# Patient Record
Sex: Female | Born: 1940
Health system: Southern US, Community
[De-identification: ages and names within clinical notes are randomized; demographics above are authoritative.]

## PROBLEM LIST (undated history)

## (undated) DIAGNOSIS — I2510 Atherosclerotic heart disease of native coronary artery without angina pectoris: Secondary | ICD-10-CM

## (undated) DIAGNOSIS — I4891 Unspecified atrial fibrillation: Secondary | ICD-10-CM

## (undated) DIAGNOSIS — R300 Dysuria: Secondary | ICD-10-CM

## (undated) DIAGNOSIS — E559 Vitamin D deficiency, unspecified: Secondary | ICD-10-CM

## (undated) DIAGNOSIS — H8110 Benign paroxysmal vertigo, unspecified ear: Secondary | ICD-10-CM

## (undated) DIAGNOSIS — G2581 Restless legs syndrome: Secondary | ICD-10-CM

## (undated) DIAGNOSIS — N3090 Cystitis, unspecified without hematuria: Secondary | ICD-10-CM

## (undated) DIAGNOSIS — Z9884 Bariatric surgery status: Secondary | ICD-10-CM

## (undated) DIAGNOSIS — R350 Frequency of micturition: Secondary | ICD-10-CM

## (undated) DIAGNOSIS — E785 Hyperlipidemia, unspecified: Secondary | ICD-10-CM

## (undated) DIAGNOSIS — Z862 Personal history of diseases of the blood and blood-forming organs and certain disorders involving the immune mechanism: Secondary | ICD-10-CM

## (undated) DIAGNOSIS — G479 Sleep disorder, unspecified: Secondary | ICD-10-CM

## (undated) DIAGNOSIS — R600 Localized edema: Secondary | ICD-10-CM

## (undated) DIAGNOSIS — F17200 Nicotine dependence, unspecified, uncomplicated: Secondary | ICD-10-CM

## (undated) HISTORY — DX: Atherosclerotic heart disease of native coronary artery without angina pectoris: I25.10

## (undated) HISTORY — DX: Nicotine dependence, unspecified, uncomplicated: F17.200

## (undated) HISTORY — DX: Dysuria: R30.0

## (undated) HISTORY — DX: Bariatric surgery status: Z98.84

## (undated) HISTORY — DX: Unspecified atrial fibrillation: I48.91

## (undated) HISTORY — DX: Sleep disorder, unspecified: G47.9

## (undated) HISTORY — DX: Hyperlipidemia, unspecified: E78.5

## (undated) HISTORY — DX: Localized edema: R60.0

## (undated) HISTORY — DX: Benign paroxysmal vertigo, unspecified ear: H81.10

## (undated) HISTORY — DX: Frequency of micturition: R35.0

## (undated) HISTORY — DX: Restless legs syndrome: G25.81

## (undated) HISTORY — DX: Vitamin D deficiency, unspecified: E55.9

## (undated) HISTORY — DX: Cystitis, unspecified without hematuria: N30.90

## (undated) HISTORY — DX: Personal history of diseases of the blood and blood-forming organs and certain disorders involving the immune mechanism: Z86.2

## (undated) HISTORY — PX: CARDIAC CATHETERIZATION: SHX172

---

## 2004-04-28 ENCOUNTER — Encounter: Payer: Self-pay | Admitting: Nurse Practitioner

## 2014-11-27 DIAGNOSIS — Z7901 Long term (current) use of anticoagulants: Secondary | ICD-10-CM | POA: Diagnosis not present

## 2014-11-27 DIAGNOSIS — I480 Paroxysmal atrial fibrillation: Secondary | ICD-10-CM | POA: Diagnosis not present

## 2014-12-02 DIAGNOSIS — J111 Influenza due to unidentified influenza virus with other respiratory manifestations: Secondary | ICD-10-CM | POA: Diagnosis not present

## 2014-12-14 DIAGNOSIS — I480 Paroxysmal atrial fibrillation: Secondary | ICD-10-CM | POA: Diagnosis not present

## 2014-12-14 DIAGNOSIS — Z7901 Long term (current) use of anticoagulants: Secondary | ICD-10-CM | POA: Diagnosis not present

## 2014-12-17 DIAGNOSIS — J069 Acute upper respiratory infection, unspecified: Secondary | ICD-10-CM | POA: Diagnosis not present

## 2014-12-22 DIAGNOSIS — J209 Acute bronchitis, unspecified: Secondary | ICD-10-CM | POA: Diagnosis not present

## 2014-12-25 DIAGNOSIS — B351 Tinea unguium: Secondary | ICD-10-CM | POA: Diagnosis not present

## 2014-12-25 DIAGNOSIS — L603 Nail dystrophy: Secondary | ICD-10-CM | POA: Diagnosis not present

## 2014-12-25 DIAGNOSIS — M7752 Other enthesopathy of left foot: Secondary | ICD-10-CM | POA: Diagnosis not present

## 2014-12-25 DIAGNOSIS — E1151 Type 2 diabetes mellitus with diabetic peripheral angiopathy without gangrene: Secondary | ICD-10-CM | POA: Diagnosis not present

## 2014-12-25 DIAGNOSIS — L84 Corns and callosities: Secondary | ICD-10-CM | POA: Diagnosis not present

## 2015-01-08 DIAGNOSIS — M25572 Pain in left ankle and joints of left foot: Secondary | ICD-10-CM | POA: Diagnosis not present

## 2015-01-08 DIAGNOSIS — M2012 Hallux valgus (acquired), left foot: Secondary | ICD-10-CM | POA: Diagnosis not present

## 2015-01-08 DIAGNOSIS — E1142 Type 2 diabetes mellitus with diabetic polyneuropathy: Secondary | ICD-10-CM | POA: Diagnosis not present

## 2015-01-08 DIAGNOSIS — M7752 Other enthesopathy of left foot: Secondary | ICD-10-CM | POA: Diagnosis not present

## 2015-01-08 DIAGNOSIS — E1151 Type 2 diabetes mellitus with diabetic peripheral angiopathy without gangrene: Secondary | ICD-10-CM | POA: Diagnosis not present

## 2015-01-11 DIAGNOSIS — I480 Paroxysmal atrial fibrillation: Secondary | ICD-10-CM | POA: Diagnosis not present

## 2015-01-11 DIAGNOSIS — I495 Sick sinus syndrome: Secondary | ICD-10-CM | POA: Diagnosis not present

## 2015-01-11 DIAGNOSIS — J0110 Acute frontal sinusitis, unspecified: Secondary | ICD-10-CM | POA: Diagnosis not present

## 2015-01-11 DIAGNOSIS — J3089 Other allergic rhinitis: Secondary | ICD-10-CM | POA: Diagnosis not present

## 2015-01-11 DIAGNOSIS — G4733 Obstructive sleep apnea (adult) (pediatric): Secondary | ICD-10-CM | POA: Diagnosis not present

## 2015-01-11 DIAGNOSIS — Z7901 Long term (current) use of anticoagulants: Secondary | ICD-10-CM | POA: Diagnosis not present

## 2015-01-11 DIAGNOSIS — I10 Essential (primary) hypertension: Secondary | ICD-10-CM | POA: Diagnosis not present

## 2015-01-18 DIAGNOSIS — G4733 Obstructive sleep apnea (adult) (pediatric): Secondary | ICD-10-CM | POA: Diagnosis not present

## 2015-01-18 DIAGNOSIS — G47419 Narcolepsy without cataplexy: Secondary | ICD-10-CM | POA: Diagnosis not present

## 2015-01-29 DIAGNOSIS — E1151 Type 2 diabetes mellitus with diabetic peripheral angiopathy without gangrene: Secondary | ICD-10-CM | POA: Diagnosis not present

## 2015-01-29 DIAGNOSIS — M79675 Pain in left toe(s): Secondary | ICD-10-CM | POA: Diagnosis not present

## 2015-01-29 DIAGNOSIS — M25572 Pain in left ankle and joints of left foot: Secondary | ICD-10-CM | POA: Diagnosis not present

## 2015-01-29 DIAGNOSIS — M7752 Other enthesopathy of left foot: Secondary | ICD-10-CM | POA: Diagnosis not present

## 2015-02-03 DIAGNOSIS — I4891 Unspecified atrial fibrillation: Secondary | ICD-10-CM | POA: Diagnosis not present

## 2015-02-03 DIAGNOSIS — Z950 Presence of cardiac pacemaker: Secondary | ICD-10-CM | POA: Diagnosis not present

## 2015-02-04 DIAGNOSIS — Z8679 Personal history of other diseases of the circulatory system: Secondary | ICD-10-CM | POA: Diagnosis not present

## 2015-02-04 DIAGNOSIS — Z8639 Personal history of other endocrine, nutritional and metabolic disease: Secondary | ICD-10-CM | POA: Diagnosis not present

## 2015-02-04 DIAGNOSIS — I209 Angina pectoris, unspecified: Secondary | ICD-10-CM | POA: Diagnosis not present

## 2015-02-10 DIAGNOSIS — I517 Cardiomegaly: Secondary | ICD-10-CM | POA: Diagnosis not present

## 2015-02-10 DIAGNOSIS — I440 Atrioventricular block, first degree: Secondary | ICD-10-CM | POA: Diagnosis not present

## 2015-02-10 DIAGNOSIS — I10 Essential (primary) hypertension: Secondary | ICD-10-CM | POA: Diagnosis not present

## 2015-02-10 DIAGNOSIS — I083 Combined rheumatic disorders of mitral, aortic and tricuspid valves: Secondary | ICD-10-CM | POA: Diagnosis not present

## 2015-02-10 DIAGNOSIS — I480 Paroxysmal atrial fibrillation: Secondary | ICD-10-CM | POA: Diagnosis not present

## 2015-02-10 DIAGNOSIS — Z7901 Long term (current) use of anticoagulants: Secondary | ICD-10-CM | POA: Diagnosis not present

## 2015-02-11 DIAGNOSIS — Z0000 Encounter for general adult medical examination without abnormal findings: Secondary | ICD-10-CM | POA: Diagnosis not present

## 2015-02-11 DIAGNOSIS — D649 Anemia, unspecified: Secondary | ICD-10-CM | POA: Diagnosis not present

## 2015-02-11 DIAGNOSIS — E780 Pure hypercholesterolemia: Secondary | ICD-10-CM | POA: Diagnosis not present

## 2015-02-11 DIAGNOSIS — D519 Vitamin B12 deficiency anemia, unspecified: Secondary | ICD-10-CM | POA: Diagnosis not present

## 2015-02-11 DIAGNOSIS — E119 Type 2 diabetes mellitus without complications: Secondary | ICD-10-CM | POA: Diagnosis not present

## 2015-02-26 DIAGNOSIS — L84 Corns and callosities: Secondary | ICD-10-CM | POA: Diagnosis not present

## 2015-02-26 DIAGNOSIS — E1151 Type 2 diabetes mellitus with diabetic peripheral angiopathy without gangrene: Secondary | ICD-10-CM | POA: Diagnosis not present

## 2015-02-26 DIAGNOSIS — L603 Nail dystrophy: Secondary | ICD-10-CM | POA: Diagnosis not present

## 2015-02-26 DIAGNOSIS — B351 Tinea unguium: Secondary | ICD-10-CM | POA: Diagnosis not present

## 2015-02-26 DIAGNOSIS — M2042 Other hammer toe(s) (acquired), left foot: Secondary | ICD-10-CM | POA: Diagnosis not present

## 2015-02-26 DIAGNOSIS — M7752 Other enthesopathy of left foot: Secondary | ICD-10-CM | POA: Diagnosis not present

## 2015-03-01 DIAGNOSIS — G4733 Obstructive sleep apnea (adult) (pediatric): Secondary | ICD-10-CM | POA: Diagnosis not present

## 2015-03-01 DIAGNOSIS — I482 Chronic atrial fibrillation: Secondary | ICD-10-CM | POA: Diagnosis not present

## 2015-03-01 DIAGNOSIS — G4710 Hypersomnia, unspecified: Secondary | ICD-10-CM | POA: Diagnosis not present

## 2015-03-04 DIAGNOSIS — E780 Pure hypercholesterolemia: Secondary | ICD-10-CM | POA: Diagnosis not present

## 2015-03-04 DIAGNOSIS — I10 Essential (primary) hypertension: Secondary | ICD-10-CM | POA: Diagnosis not present

## 2015-03-04 DIAGNOSIS — E119 Type 2 diabetes mellitus without complications: Secondary | ICD-10-CM | POA: Diagnosis not present

## 2015-03-04 DIAGNOSIS — I214 Non-ST elevation (NSTEMI) myocardial infarction: Secondary | ICD-10-CM | POA: Diagnosis not present

## 2015-03-08 DIAGNOSIS — M79672 Pain in left foot: Secondary | ICD-10-CM | POA: Diagnosis not present

## 2015-03-08 DIAGNOSIS — M2042 Other hammer toe(s) (acquired), left foot: Secondary | ICD-10-CM | POA: Diagnosis not present

## 2015-03-08 DIAGNOSIS — I495 Sick sinus syndrome: Secondary | ICD-10-CM | POA: Diagnosis not present

## 2015-03-08 DIAGNOSIS — I10 Essential (primary) hypertension: Secondary | ICD-10-CM | POA: Diagnosis not present

## 2015-03-08 DIAGNOSIS — M2012 Hallux valgus (acquired), left foot: Secondary | ICD-10-CM | POA: Diagnosis not present

## 2015-03-08 DIAGNOSIS — I480 Paroxysmal atrial fibrillation: Secondary | ICD-10-CM | POA: Diagnosis not present

## 2015-03-08 DIAGNOSIS — Z7901 Long term (current) use of anticoagulants: Secondary | ICD-10-CM | POA: Diagnosis not present

## 2015-03-12 DIAGNOSIS — Z7901 Long term (current) use of anticoagulants: Secondary | ICD-10-CM | POA: Diagnosis not present

## 2015-03-12 DIAGNOSIS — I480 Paroxysmal atrial fibrillation: Secondary | ICD-10-CM | POA: Diagnosis not present

## 2015-03-16 DIAGNOSIS — Z8679 Personal history of other diseases of the circulatory system: Secondary | ICD-10-CM | POA: Diagnosis not present

## 2015-03-16 DIAGNOSIS — D509 Iron deficiency anemia, unspecified: Secondary | ICD-10-CM | POA: Diagnosis not present

## 2015-03-16 DIAGNOSIS — E119 Type 2 diabetes mellitus without complications: Secondary | ICD-10-CM | POA: Diagnosis not present

## 2015-03-16 DIAGNOSIS — Z8639 Personal history of other endocrine, nutritional and metabolic disease: Secondary | ICD-10-CM | POA: Diagnosis not present

## 2015-03-16 DIAGNOSIS — I10 Essential (primary) hypertension: Secondary | ICD-10-CM | POA: Diagnosis not present

## 2015-03-18 DIAGNOSIS — I480 Paroxysmal atrial fibrillation: Secondary | ICD-10-CM | POA: Diagnosis not present

## 2015-03-18 DIAGNOSIS — Z01818 Encounter for other preprocedural examination: Secondary | ICD-10-CM | POA: Diagnosis not present

## 2015-03-18 DIAGNOSIS — Z7901 Long term (current) use of anticoagulants: Secondary | ICD-10-CM | POA: Diagnosis not present

## 2015-03-18 DIAGNOSIS — I7389 Other specified peripheral vascular diseases: Secondary | ICD-10-CM | POA: Diagnosis not present

## 2015-03-19 DIAGNOSIS — Z7901 Long term (current) use of anticoagulants: Secondary | ICD-10-CM | POA: Diagnosis not present

## 2015-03-19 DIAGNOSIS — I480 Paroxysmal atrial fibrillation: Secondary | ICD-10-CM | POA: Diagnosis not present

## 2015-03-22 DIAGNOSIS — Z7901 Long term (current) use of anticoagulants: Secondary | ICD-10-CM | POA: Diagnosis not present

## 2015-03-22 DIAGNOSIS — I480 Paroxysmal atrial fibrillation: Secondary | ICD-10-CM | POA: Diagnosis not present

## 2015-04-01 DIAGNOSIS — M79672 Pain in left foot: Secondary | ICD-10-CM | POA: Diagnosis not present

## 2015-04-01 DIAGNOSIS — E119 Type 2 diabetes mellitus without complications: Secondary | ICD-10-CM | POA: Diagnosis not present

## 2015-04-01 DIAGNOSIS — M2042 Other hammer toe(s) (acquired), left foot: Secondary | ICD-10-CM | POA: Diagnosis not present

## 2015-04-01 DIAGNOSIS — I10 Essential (primary) hypertension: Secondary | ICD-10-CM | POA: Diagnosis not present

## 2015-04-01 DIAGNOSIS — M2012 Hallux valgus (acquired), left foot: Secondary | ICD-10-CM | POA: Diagnosis not present

## 2015-04-09 DIAGNOSIS — M2012 Hallux valgus (acquired), left foot: Secondary | ICD-10-CM | POA: Diagnosis not present

## 2015-04-09 DIAGNOSIS — G4733 Obstructive sleep apnea (adult) (pediatric): Secondary | ICD-10-CM | POA: Diagnosis not present

## 2015-04-09 DIAGNOSIS — E119 Type 2 diabetes mellitus without complications: Secondary | ICD-10-CM | POA: Diagnosis not present

## 2015-04-09 DIAGNOSIS — I10 Essential (primary) hypertension: Secondary | ICD-10-CM | POA: Diagnosis not present

## 2015-04-09 DIAGNOSIS — Z9884 Bariatric surgery status: Secondary | ICD-10-CM | POA: Diagnosis not present

## 2015-04-09 DIAGNOSIS — M2042 Other hammer toe(s) (acquired), left foot: Secondary | ICD-10-CM | POA: Diagnosis not present

## 2015-04-09 DIAGNOSIS — L859 Epidermal thickening, unspecified: Secondary | ICD-10-CM | POA: Diagnosis not present

## 2015-04-09 DIAGNOSIS — I4891 Unspecified atrial fibrillation: Secondary | ICD-10-CM | POA: Diagnosis not present

## 2015-04-09 DIAGNOSIS — F17200 Nicotine dependence, unspecified, uncomplicated: Secondary | ICD-10-CM | POA: Diagnosis not present

## 2015-04-09 DIAGNOSIS — J449 Chronic obstructive pulmonary disease, unspecified: Secondary | ICD-10-CM | POA: Diagnosis not present

## 2015-04-09 DIAGNOSIS — M19072 Primary osteoarthritis, left ankle and foot: Secondary | ICD-10-CM | POA: Diagnosis not present

## 2015-04-09 DIAGNOSIS — M79675 Pain in left toe(s): Secondary | ICD-10-CM | POA: Diagnosis not present

## 2015-04-09 DIAGNOSIS — M79672 Pain in left foot: Secondary | ICD-10-CM | POA: Diagnosis not present

## 2015-04-15 DIAGNOSIS — K635 Polyp of colon: Secondary | ICD-10-CM | POA: Diagnosis not present

## 2015-04-15 DIAGNOSIS — R159 Full incontinence of feces: Secondary | ICD-10-CM | POA: Diagnosis not present

## 2015-04-15 DIAGNOSIS — R194 Change in bowel habit: Secondary | ICD-10-CM | POA: Diagnosis not present

## 2015-04-15 DIAGNOSIS — K219 Gastro-esophageal reflux disease without esophagitis: Secondary | ICD-10-CM | POA: Diagnosis not present

## 2015-04-26 DIAGNOSIS — I495 Sick sinus syndrome: Secondary | ICD-10-CM | POA: Diagnosis not present

## 2015-04-26 DIAGNOSIS — I4891 Unspecified atrial fibrillation: Secondary | ICD-10-CM | POA: Diagnosis not present

## 2015-04-26 DIAGNOSIS — I10 Essential (primary) hypertension: Secondary | ICD-10-CM | POA: Diagnosis not present

## 2015-04-29 DIAGNOSIS — Z8679 Personal history of other diseases of the circulatory system: Secondary | ICD-10-CM | POA: Diagnosis not present

## 2015-04-29 DIAGNOSIS — E119 Type 2 diabetes mellitus without complications: Secondary | ICD-10-CM | POA: Diagnosis not present

## 2015-04-29 DIAGNOSIS — E780 Pure hypercholesterolemia: Secondary | ICD-10-CM | POA: Diagnosis not present

## 2015-04-29 DIAGNOSIS — K5900 Constipation, unspecified: Secondary | ICD-10-CM | POA: Diagnosis not present

## 2015-05-05 DIAGNOSIS — M2042 Other hammer toe(s) (acquired), left foot: Secondary | ICD-10-CM | POA: Diagnosis not present

## 2015-05-05 DIAGNOSIS — M2012 Hallux valgus (acquired), left foot: Secondary | ICD-10-CM | POA: Diagnosis not present

## 2015-05-13 ENCOUNTER — Encounter: Payer: Self-pay | Admitting: Nurse Practitioner

## 2015-05-13 DIAGNOSIS — D124 Benign neoplasm of descending colon: Secondary | ICD-10-CM | POA: Diagnosis not present

## 2015-05-13 DIAGNOSIS — K635 Polyp of colon: Secondary | ICD-10-CM | POA: Diagnosis not present

## 2015-05-13 DIAGNOSIS — Z880 Allergy status to penicillin: Secondary | ICD-10-CM | POA: Diagnosis not present

## 2015-05-13 DIAGNOSIS — D649 Anemia, unspecified: Secondary | ICD-10-CM | POA: Diagnosis not present

## 2015-05-13 DIAGNOSIS — R159 Full incontinence of feces: Secondary | ICD-10-CM | POA: Diagnosis not present

## 2015-05-13 DIAGNOSIS — K6389 Other specified diseases of intestine: Secondary | ICD-10-CM | POA: Diagnosis not present

## 2015-05-13 DIAGNOSIS — D122 Benign neoplasm of ascending colon: Secondary | ICD-10-CM | POA: Diagnosis not present

## 2015-05-13 DIAGNOSIS — I10 Essential (primary) hypertension: Secondary | ICD-10-CM | POA: Diagnosis not present

## 2015-05-13 DIAGNOSIS — R194 Change in bowel habit: Secondary | ICD-10-CM | POA: Diagnosis not present

## 2015-05-13 DIAGNOSIS — D125 Benign neoplasm of sigmoid colon: Secondary | ICD-10-CM | POA: Diagnosis not present

## 2015-05-13 DIAGNOSIS — I4891 Unspecified atrial fibrillation: Secondary | ICD-10-CM | POA: Diagnosis not present

## 2015-05-13 DIAGNOSIS — E119 Type 2 diabetes mellitus without complications: Secondary | ICD-10-CM | POA: Diagnosis not present

## 2015-05-13 DIAGNOSIS — K219 Gastro-esophageal reflux disease without esophagitis: Secondary | ICD-10-CM | POA: Diagnosis not present

## 2015-05-19 DIAGNOSIS — M2042 Other hammer toe(s) (acquired), left foot: Secondary | ICD-10-CM | POA: Diagnosis not present

## 2015-05-19 DIAGNOSIS — M2012 Hallux valgus (acquired), left foot: Secondary | ICD-10-CM | POA: Diagnosis not present

## 2015-05-31 DIAGNOSIS — G4733 Obstructive sleep apnea (adult) (pediatric): Secondary | ICD-10-CM | POA: Diagnosis not present

## 2015-05-31 DIAGNOSIS — G4710 Hypersomnia, unspecified: Secondary | ICD-10-CM | POA: Diagnosis not present

## 2015-06-17 DIAGNOSIS — I480 Paroxysmal atrial fibrillation: Secondary | ICD-10-CM | POA: Diagnosis not present

## 2015-06-17 DIAGNOSIS — I495 Sick sinus syndrome: Secondary | ICD-10-CM | POA: Diagnosis not present

## 2015-06-17 DIAGNOSIS — Z45018 Encounter for adjustment and management of other part of cardiac pacemaker: Secondary | ICD-10-CM | POA: Diagnosis not present

## 2015-06-17 DIAGNOSIS — M2042 Other hammer toe(s) (acquired), left foot: Secondary | ICD-10-CM | POA: Diagnosis not present

## 2015-06-17 DIAGNOSIS — Z950 Presence of cardiac pacemaker: Secondary | ICD-10-CM | POA: Diagnosis not present

## 2015-06-17 DIAGNOSIS — M2012 Hallux valgus (acquired), left foot: Secondary | ICD-10-CM | POA: Diagnosis not present

## 2015-06-18 DIAGNOSIS — H2513 Age-related nuclear cataract, bilateral: Secondary | ICD-10-CM | POA: Diagnosis not present

## 2015-06-18 DIAGNOSIS — E119 Type 2 diabetes mellitus without complications: Secondary | ICD-10-CM | POA: Diagnosis not present

## 2015-06-18 DIAGNOSIS — H25013 Cortical age-related cataract, bilateral: Secondary | ICD-10-CM | POA: Diagnosis not present

## 2015-07-08 DIAGNOSIS — E1151 Type 2 diabetes mellitus with diabetic peripheral angiopathy without gangrene: Secondary | ICD-10-CM | POA: Diagnosis not present

## 2015-07-08 DIAGNOSIS — L84 Corns and callosities: Secondary | ICD-10-CM | POA: Diagnosis not present

## 2015-07-08 DIAGNOSIS — B351 Tinea unguium: Secondary | ICD-10-CM | POA: Diagnosis not present

## 2015-07-12 DIAGNOSIS — D519 Vitamin B12 deficiency anemia, unspecified: Secondary | ICD-10-CM | POA: Diagnosis not present

## 2015-07-12 DIAGNOSIS — Z139 Encounter for screening, unspecified: Secondary | ICD-10-CM | POA: Diagnosis not present

## 2015-07-12 DIAGNOSIS — E780 Pure hypercholesterolemia: Secondary | ICD-10-CM | POA: Diagnosis not present

## 2015-07-12 DIAGNOSIS — D649 Anemia, unspecified: Secondary | ICD-10-CM | POA: Diagnosis not present

## 2015-07-20 DIAGNOSIS — I10 Essential (primary) hypertension: Secondary | ICD-10-CM | POA: Diagnosis not present

## 2015-07-20 DIAGNOSIS — E780 Pure hypercholesterolemia: Secondary | ICD-10-CM | POA: Diagnosis not present

## 2015-07-20 DIAGNOSIS — I209 Angina pectoris, unspecified: Secondary | ICD-10-CM | POA: Diagnosis not present

## 2015-07-20 DIAGNOSIS — E119 Type 2 diabetes mellitus without complications: Secondary | ICD-10-CM | POA: Diagnosis not present

## 2015-08-12 DIAGNOSIS — R151 Fecal smearing: Secondary | ICD-10-CM | POA: Diagnosis not present

## 2015-08-12 DIAGNOSIS — K5900 Constipation, unspecified: Secondary | ICD-10-CM | POA: Diagnosis not present

## 2015-08-12 DIAGNOSIS — R140 Abdominal distension (gaseous): Secondary | ICD-10-CM | POA: Diagnosis not present

## 2015-08-12 DIAGNOSIS — R12 Heartburn: Secondary | ICD-10-CM | POA: Diagnosis not present

## 2015-08-12 DIAGNOSIS — D509 Iron deficiency anemia, unspecified: Secondary | ICD-10-CM | POA: Diagnosis not present

## 2015-08-12 DIAGNOSIS — K219 Gastro-esophageal reflux disease without esophagitis: Secondary | ICD-10-CM | POA: Diagnosis not present

## 2015-08-26 DIAGNOSIS — K5900 Constipation, unspecified: Secondary | ICD-10-CM | POA: Diagnosis not present

## 2015-08-26 DIAGNOSIS — R140 Abdominal distension (gaseous): Secondary | ICD-10-CM | POA: Diagnosis not present

## 2015-09-02 ENCOUNTER — Encounter: Payer: Self-pay | Admitting: Nurse Practitioner

## 2015-09-02 DIAGNOSIS — D509 Iron deficiency anemia, unspecified: Secondary | ICD-10-CM | POA: Diagnosis not present

## 2015-09-02 DIAGNOSIS — Z950 Presence of cardiac pacemaker: Secondary | ICD-10-CM | POA: Diagnosis not present

## 2015-09-02 DIAGNOSIS — R12 Heartburn: Secondary | ICD-10-CM | POA: Diagnosis not present

## 2015-09-02 DIAGNOSIS — Z720 Tobacco use: Secondary | ICD-10-CM | POA: Diagnosis not present

## 2015-09-02 DIAGNOSIS — E119 Type 2 diabetes mellitus without complications: Secondary | ICD-10-CM | POA: Diagnosis not present

## 2015-09-02 DIAGNOSIS — I4891 Unspecified atrial fibrillation: Secondary | ICD-10-CM | POA: Diagnosis not present

## 2015-09-02 DIAGNOSIS — R140 Abdominal distension (gaseous): Secondary | ICD-10-CM | POA: Diagnosis not present

## 2015-09-02 DIAGNOSIS — Z9884 Bariatric surgery status: Secondary | ICD-10-CM | POA: Diagnosis not present

## 2015-09-02 DIAGNOSIS — K219 Gastro-esophageal reflux disease without esophagitis: Secondary | ICD-10-CM | POA: Diagnosis not present

## 2015-09-02 DIAGNOSIS — D649 Anemia, unspecified: Secondary | ICD-10-CM | POA: Diagnosis not present

## 2015-09-02 DIAGNOSIS — I10 Essential (primary) hypertension: Secondary | ICD-10-CM | POA: Diagnosis not present

## 2015-09-02 DIAGNOSIS — Z7902 Long term (current) use of antithrombotics/antiplatelets: Secondary | ICD-10-CM | POA: Diagnosis not present

## 2015-09-02 DIAGNOSIS — K222 Esophageal obstruction: Secondary | ICD-10-CM | POA: Diagnosis not present

## 2015-09-02 DIAGNOSIS — G2581 Restless legs syndrome: Secondary | ICD-10-CM | POA: Diagnosis not present

## 2015-09-08 DIAGNOSIS — Z950 Presence of cardiac pacemaker: Secondary | ICD-10-CM | POA: Diagnosis not present

## 2015-09-08 DIAGNOSIS — I495 Sick sinus syndrome: Secondary | ICD-10-CM | POA: Diagnosis not present

## 2015-09-08 DIAGNOSIS — I480 Paroxysmal atrial fibrillation: Secondary | ICD-10-CM | POA: Diagnosis not present

## 2015-09-16 DIAGNOSIS — G47419 Narcolepsy without cataplexy: Secondary | ICD-10-CM | POA: Diagnosis not present

## 2015-09-16 DIAGNOSIS — G4733 Obstructive sleep apnea (adult) (pediatric): Secondary | ICD-10-CM | POA: Diagnosis not present

## 2015-09-30 DIAGNOSIS — B351 Tinea unguium: Secondary | ICD-10-CM | POA: Diagnosis not present

## 2015-09-30 DIAGNOSIS — L84 Corns and callosities: Secondary | ICD-10-CM | POA: Diagnosis not present

## 2015-09-30 DIAGNOSIS — E1151 Type 2 diabetes mellitus with diabetic peripheral angiopathy without gangrene: Secondary | ICD-10-CM | POA: Diagnosis not present

## 2015-10-12 DIAGNOSIS — E119 Type 2 diabetes mellitus without complications: Secondary | ICD-10-CM | POA: Diagnosis not present

## 2015-10-12 DIAGNOSIS — L309 Dermatitis, unspecified: Secondary | ICD-10-CM | POA: Diagnosis not present

## 2015-10-12 DIAGNOSIS — I10 Essential (primary) hypertension: Secondary | ICD-10-CM | POA: Diagnosis not present

## 2015-10-12 DIAGNOSIS — Z23 Encounter for immunization: Secondary | ICD-10-CM | POA: Diagnosis not present

## 2015-10-27 DIAGNOSIS — J00 Acute nasopharyngitis [common cold]: Secondary | ICD-10-CM | POA: Diagnosis not present

## 2015-10-27 DIAGNOSIS — J40 Bronchitis, not specified as acute or chronic: Secondary | ICD-10-CM | POA: Diagnosis not present

## 2015-10-27 DIAGNOSIS — I495 Sick sinus syndrome: Secondary | ICD-10-CM | POA: Diagnosis not present

## 2015-10-27 DIAGNOSIS — I4891 Unspecified atrial fibrillation: Secondary | ICD-10-CM | POA: Diagnosis not present

## 2015-10-27 DIAGNOSIS — I10 Essential (primary) hypertension: Secondary | ICD-10-CM | POA: Diagnosis not present

## 2015-12-03 DIAGNOSIS — K5900 Constipation, unspecified: Secondary | ICD-10-CM | POA: Diagnosis not present

## 2015-12-03 DIAGNOSIS — R151 Fecal smearing: Secondary | ICD-10-CM | POA: Diagnosis not present

## 2015-12-03 DIAGNOSIS — B351 Tinea unguium: Secondary | ICD-10-CM | POA: Diagnosis not present

## 2015-12-03 DIAGNOSIS — D509 Iron deficiency anemia, unspecified: Secondary | ICD-10-CM | POA: Diagnosis not present

## 2015-12-03 DIAGNOSIS — L84 Corns and callosities: Secondary | ICD-10-CM | POA: Diagnosis not present

## 2015-12-03 DIAGNOSIS — R6881 Early satiety: Secondary | ICD-10-CM | POA: Diagnosis not present

## 2015-12-03 DIAGNOSIS — E1151 Type 2 diabetes mellitus with diabetic peripheral angiopathy without gangrene: Secondary | ICD-10-CM | POA: Diagnosis not present

## 2015-12-03 DIAGNOSIS — R140 Abdominal distension (gaseous): Secondary | ICD-10-CM | POA: Diagnosis not present

## 2015-12-03 DIAGNOSIS — R12 Heartburn: Secondary | ICD-10-CM | POA: Diagnosis not present

## 2015-12-10 DIAGNOSIS — I480 Paroxysmal atrial fibrillation: Secondary | ICD-10-CM | POA: Diagnosis not present

## 2015-12-10 DIAGNOSIS — Z950 Presence of cardiac pacemaker: Secondary | ICD-10-CM | POA: Diagnosis not present

## 2015-12-10 DIAGNOSIS — I495 Sick sinus syndrome: Secondary | ICD-10-CM | POA: Diagnosis not present

## 2015-12-13 DIAGNOSIS — K5900 Constipation, unspecified: Secondary | ICD-10-CM | POA: Diagnosis not present

## 2015-12-13 DIAGNOSIS — R159 Full incontinence of feces: Secondary | ICD-10-CM | POA: Diagnosis not present

## 2015-12-14 DIAGNOSIS — G47419 Narcolepsy without cataplexy: Secondary | ICD-10-CM | POA: Diagnosis not present

## 2015-12-14 DIAGNOSIS — R51 Headache: Secondary | ICD-10-CM | POA: Diagnosis not present

## 2015-12-14 DIAGNOSIS — F17210 Nicotine dependence, cigarettes, uncomplicated: Secondary | ICD-10-CM | POA: Diagnosis not present

## 2015-12-14 DIAGNOSIS — R42 Dizziness and giddiness: Secondary | ICD-10-CM | POA: Diagnosis not present

## 2015-12-14 DIAGNOSIS — J45909 Unspecified asthma, uncomplicated: Secondary | ICD-10-CM | POA: Diagnosis not present

## 2015-12-14 DIAGNOSIS — R531 Weakness: Secondary | ICD-10-CM | POA: Diagnosis not present

## 2015-12-14 DIAGNOSIS — I10 Essential (primary) hypertension: Secondary | ICD-10-CM | POA: Diagnosis not present

## 2015-12-14 DIAGNOSIS — E785 Hyperlipidemia, unspecified: Secondary | ICD-10-CM | POA: Diagnosis not present

## 2015-12-14 DIAGNOSIS — Z8673 Personal history of transient ischemic attack (TIA), and cerebral infarction without residual deficits: Secondary | ICD-10-CM | POA: Diagnosis not present

## 2015-12-14 DIAGNOSIS — E119 Type 2 diabetes mellitus without complications: Secondary | ICD-10-CM | POA: Diagnosis not present

## 2015-12-14 DIAGNOSIS — I4891 Unspecified atrial fibrillation: Secondary | ICD-10-CM | POA: Diagnosis not present

## 2015-12-14 DIAGNOSIS — Z79899 Other long term (current) drug therapy: Secondary | ICD-10-CM | POA: Diagnosis not present

## 2015-12-14 DIAGNOSIS — R079 Chest pain, unspecified: Secondary | ICD-10-CM | POA: Diagnosis not present

## 2015-12-15 DIAGNOSIS — I10 Essential (primary) hypertension: Secondary | ICD-10-CM | POA: Diagnosis not present

## 2015-12-15 DIAGNOSIS — R079 Chest pain, unspecified: Secondary | ICD-10-CM | POA: Diagnosis not present

## 2015-12-15 DIAGNOSIS — R42 Dizziness and giddiness: Secondary | ICD-10-CM | POA: Diagnosis not present

## 2015-12-16 DIAGNOSIS — D509 Iron deficiency anemia, unspecified: Secondary | ICD-10-CM | POA: Diagnosis not present

## 2015-12-20 DIAGNOSIS — R5383 Other fatigue: Secondary | ICD-10-CM | POA: Diagnosis not present

## 2015-12-20 DIAGNOSIS — D509 Iron deficiency anemia, unspecified: Secondary | ICD-10-CM | POA: Diagnosis not present

## 2015-12-20 DIAGNOSIS — K5900 Constipation, unspecified: Secondary | ICD-10-CM | POA: Diagnosis not present

## 2015-12-20 DIAGNOSIS — K599 Functional intestinal disorder, unspecified: Secondary | ICD-10-CM | POA: Diagnosis not present

## 2015-12-20 DIAGNOSIS — I495 Sick sinus syndrome: Secondary | ICD-10-CM | POA: Diagnosis not present

## 2015-12-20 DIAGNOSIS — R42 Dizziness and giddiness: Secondary | ICD-10-CM | POA: Diagnosis not present

## 2015-12-20 DIAGNOSIS — I4891 Unspecified atrial fibrillation: Secondary | ICD-10-CM | POA: Diagnosis not present

## 2015-12-21 DIAGNOSIS — K5900 Constipation, unspecified: Secondary | ICD-10-CM | POA: Diagnosis not present

## 2015-12-22 DIAGNOSIS — K5900 Constipation, unspecified: Secondary | ICD-10-CM | POA: Diagnosis not present

## 2015-12-22 DIAGNOSIS — R42 Dizziness and giddiness: Secondary | ICD-10-CM | POA: Diagnosis not present

## 2015-12-22 DIAGNOSIS — I10 Essential (primary) hypertension: Secondary | ICD-10-CM | POA: Diagnosis not present

## 2015-12-23 DIAGNOSIS — D509 Iron deficiency anemia, unspecified: Secondary | ICD-10-CM | POA: Diagnosis not present

## 2015-12-24 DIAGNOSIS — I480 Paroxysmal atrial fibrillation: Secondary | ICD-10-CM | POA: Diagnosis not present

## 2015-12-24 DIAGNOSIS — K5900 Constipation, unspecified: Secondary | ICD-10-CM | POA: Diagnosis not present

## 2015-12-24 DIAGNOSIS — I4891 Unspecified atrial fibrillation: Secondary | ICD-10-CM | POA: Diagnosis not present

## 2015-12-27 DIAGNOSIS — D509 Iron deficiency anemia, unspecified: Secondary | ICD-10-CM | POA: Diagnosis not present

## 2015-12-27 DIAGNOSIS — K5900 Constipation, unspecified: Secondary | ICD-10-CM | POA: Diagnosis not present

## 2016-01-05 DIAGNOSIS — D509 Iron deficiency anemia, unspecified: Secondary | ICD-10-CM | POA: Diagnosis not present

## 2016-01-13 DIAGNOSIS — G4733 Obstructive sleep apnea (adult) (pediatric): Secondary | ICD-10-CM | POA: Diagnosis not present

## 2016-01-13 DIAGNOSIS — R0689 Other abnormalities of breathing: Secondary | ICD-10-CM | POA: Diagnosis not present

## 2016-01-17 DIAGNOSIS — L309 Dermatitis, unspecified: Secondary | ICD-10-CM | POA: Diagnosis not present

## 2016-01-17 DIAGNOSIS — I10 Essential (primary) hypertension: Secondary | ICD-10-CM | POA: Diagnosis not present

## 2016-02-11 DIAGNOSIS — R002 Palpitations: Secondary | ICD-10-CM | POA: Diagnosis not present

## 2016-02-13 DIAGNOSIS — Z01810 Encounter for preprocedural cardiovascular examination: Secondary | ICD-10-CM | POA: Diagnosis not present

## 2016-02-17 DIAGNOSIS — R300 Dysuria: Secondary | ICD-10-CM | POA: Diagnosis not present

## 2016-02-17 DIAGNOSIS — R002 Palpitations: Secondary | ICD-10-CM | POA: Diagnosis not present

## 2016-02-17 DIAGNOSIS — R3915 Urgency of urination: Secondary | ICD-10-CM | POA: Diagnosis not present

## 2016-02-17 DIAGNOSIS — N390 Urinary tract infection, site not specified: Secondary | ICD-10-CM | POA: Diagnosis not present

## 2016-02-23 DIAGNOSIS — M79672 Pain in left foot: Secondary | ICD-10-CM | POA: Diagnosis not present

## 2016-02-23 DIAGNOSIS — M19072 Primary osteoarthritis, left ankle and foot: Secondary | ICD-10-CM | POA: Diagnosis not present

## 2016-02-23 DIAGNOSIS — B351 Tinea unguium: Secondary | ICD-10-CM | POA: Diagnosis not present

## 2016-02-23 DIAGNOSIS — E1151 Type 2 diabetes mellitus with diabetic peripheral angiopathy without gangrene: Secondary | ICD-10-CM | POA: Diagnosis not present

## 2016-02-23 DIAGNOSIS — L84 Corns and callosities: Secondary | ICD-10-CM | POA: Diagnosis not present

## 2016-03-01 DIAGNOSIS — I495 Sick sinus syndrome: Secondary | ICD-10-CM | POA: Diagnosis not present

## 2016-03-01 DIAGNOSIS — R42 Dizziness and giddiness: Secondary | ICD-10-CM | POA: Diagnosis not present

## 2016-03-01 DIAGNOSIS — I10 Essential (primary) hypertension: Secondary | ICD-10-CM | POA: Diagnosis not present

## 2016-03-01 DIAGNOSIS — R000 Tachycardia, unspecified: Secondary | ICD-10-CM | POA: Diagnosis not present

## 2016-03-08 DIAGNOSIS — N390 Urinary tract infection, site not specified: Secondary | ICD-10-CM | POA: Diagnosis not present

## 2016-03-10 DIAGNOSIS — N390 Urinary tract infection, site not specified: Secondary | ICD-10-CM | POA: Diagnosis not present

## 2016-03-27 DIAGNOSIS — R197 Diarrhea, unspecified: Secondary | ICD-10-CM | POA: Diagnosis not present

## 2016-03-27 DIAGNOSIS — F411 Generalized anxiety disorder: Secondary | ICD-10-CM | POA: Diagnosis not present

## 2016-03-28 DIAGNOSIS — Z950 Presence of cardiac pacemaker: Secondary | ICD-10-CM | POA: Diagnosis not present

## 2016-03-28 DIAGNOSIS — I495 Sick sinus syndrome: Secondary | ICD-10-CM | POA: Diagnosis not present

## 2016-03-28 DIAGNOSIS — I480 Paroxysmal atrial fibrillation: Secondary | ICD-10-CM | POA: Diagnosis not present

## 2016-04-06 DIAGNOSIS — L920 Granuloma annulare: Secondary | ICD-10-CM | POA: Diagnosis not present

## 2016-04-06 DIAGNOSIS — D485 Neoplasm of uncertain behavior of skin: Secondary | ICD-10-CM | POA: Diagnosis not present

## 2016-04-06 DIAGNOSIS — L910 Hypertrophic scar: Secondary | ICD-10-CM | POA: Diagnosis not present

## 2016-04-07 DIAGNOSIS — R197 Diarrhea, unspecified: Secondary | ICD-10-CM | POA: Diagnosis not present

## 2016-04-20 DIAGNOSIS — L304 Erythema intertrigo: Secondary | ICD-10-CM | POA: Diagnosis not present

## 2016-05-05 DIAGNOSIS — E1151 Type 2 diabetes mellitus with diabetic peripheral angiopathy without gangrene: Secondary | ICD-10-CM | POA: Diagnosis not present

## 2016-05-05 DIAGNOSIS — B351 Tinea unguium: Secondary | ICD-10-CM | POA: Diagnosis not present

## 2016-05-05 DIAGNOSIS — L84 Corns and callosities: Secondary | ICD-10-CM | POA: Diagnosis not present

## 2016-05-08 DIAGNOSIS — R197 Diarrhea, unspecified: Secondary | ICD-10-CM | POA: Diagnosis not present

## 2016-05-08 DIAGNOSIS — M792 Neuralgia and neuritis, unspecified: Secondary | ICD-10-CM | POA: Diagnosis not present

## 2016-05-08 DIAGNOSIS — M25512 Pain in left shoulder: Secondary | ICD-10-CM | POA: Diagnosis not present

## 2016-05-08 DIAGNOSIS — M79622 Pain in left upper arm: Secondary | ICD-10-CM | POA: Diagnosis not present

## 2016-05-09 DIAGNOSIS — M25512 Pain in left shoulder: Secondary | ICD-10-CM | POA: Diagnosis not present

## 2016-05-09 DIAGNOSIS — M79622 Pain in left upper arm: Secondary | ICD-10-CM | POA: Diagnosis not present

## 2016-05-15 DIAGNOSIS — I4891 Unspecified atrial fibrillation: Secondary | ICD-10-CM | POA: Diagnosis not present

## 2016-05-15 DIAGNOSIS — I495 Sick sinus syndrome: Secondary | ICD-10-CM | POA: Diagnosis not present

## 2016-05-15 DIAGNOSIS — I471 Supraventricular tachycardia: Secondary | ICD-10-CM | POA: Diagnosis not present

## 2016-05-15 DIAGNOSIS — I10 Essential (primary) hypertension: Secondary | ICD-10-CM | POA: Diagnosis not present

## 2016-05-16 DIAGNOSIS — Z01810 Encounter for preprocedural cardiovascular examination: Secondary | ICD-10-CM | POA: Diagnosis not present

## 2016-05-16 DIAGNOSIS — I4891 Unspecified atrial fibrillation: Secondary | ICD-10-CM | POA: Diagnosis not present

## 2016-05-22 DIAGNOSIS — I480 Paroxysmal atrial fibrillation: Secondary | ICD-10-CM | POA: Diagnosis not present

## 2016-05-22 DIAGNOSIS — I471 Supraventricular tachycardia: Secondary | ICD-10-CM | POA: Diagnosis not present

## 2016-05-31 DIAGNOSIS — M25512 Pain in left shoulder: Secondary | ICD-10-CM | POA: Diagnosis not present

## 2016-06-02 DIAGNOSIS — E611 Iron deficiency: Secondary | ICD-10-CM | POA: Diagnosis not present

## 2016-06-02 DIAGNOSIS — R140 Abdominal distension (gaseous): Secondary | ICD-10-CM | POA: Diagnosis not present

## 2016-06-02 DIAGNOSIS — R151 Fecal smearing: Secondary | ICD-10-CM | POA: Diagnosis not present

## 2016-06-02 DIAGNOSIS — K589 Irritable bowel syndrome without diarrhea: Secondary | ICD-10-CM | POA: Diagnosis not present

## 2016-06-05 DIAGNOSIS — M25512 Pain in left shoulder: Secondary | ICD-10-CM | POA: Diagnosis not present

## 2016-06-06 DIAGNOSIS — D519 Vitamin B12 deficiency anemia, unspecified: Secondary | ICD-10-CM | POA: Diagnosis not present

## 2016-06-06 DIAGNOSIS — R151 Fecal smearing: Secondary | ICD-10-CM | POA: Diagnosis not present

## 2016-06-06 DIAGNOSIS — E559 Vitamin D deficiency, unspecified: Secondary | ICD-10-CM | POA: Diagnosis not present

## 2016-06-06 DIAGNOSIS — R140 Abdominal distension (gaseous): Secondary | ICD-10-CM | POA: Diagnosis not present

## 2016-06-06 DIAGNOSIS — K589 Irritable bowel syndrome without diarrhea: Secondary | ICD-10-CM | POA: Diagnosis not present

## 2016-06-08 DIAGNOSIS — M25512 Pain in left shoulder: Secondary | ICD-10-CM | POA: Diagnosis not present

## 2016-06-09 DIAGNOSIS — M25512 Pain in left shoulder: Secondary | ICD-10-CM | POA: Diagnosis not present

## 2016-06-12 DIAGNOSIS — M25512 Pain in left shoulder: Secondary | ICD-10-CM | POA: Diagnosis not present

## 2016-06-14 DIAGNOSIS — M25512 Pain in left shoulder: Secondary | ICD-10-CM | POA: Diagnosis not present

## 2016-06-15 DIAGNOSIS — Z45018 Encounter for adjustment and management of other part of cardiac pacemaker: Secondary | ICD-10-CM | POA: Diagnosis not present

## 2016-06-15 DIAGNOSIS — I480 Paroxysmal atrial fibrillation: Secondary | ICD-10-CM | POA: Diagnosis not present

## 2016-06-15 DIAGNOSIS — Z950 Presence of cardiac pacemaker: Secondary | ICD-10-CM | POA: Diagnosis not present

## 2016-06-15 DIAGNOSIS — I495 Sick sinus syndrome: Secondary | ICD-10-CM | POA: Diagnosis not present

## 2016-06-16 DIAGNOSIS — H2513 Age-related nuclear cataract, bilateral: Secondary | ICD-10-CM | POA: Diagnosis not present

## 2016-06-16 DIAGNOSIS — M25512 Pain in left shoulder: Secondary | ICD-10-CM | POA: Diagnosis not present

## 2016-06-16 DIAGNOSIS — H353130 Nonexudative age-related macular degeneration, bilateral, stage unspecified: Secondary | ICD-10-CM | POA: Diagnosis not present

## 2016-06-16 DIAGNOSIS — H25011 Cortical age-related cataract, right eye: Secondary | ICD-10-CM | POA: Diagnosis not present

## 2016-06-16 DIAGNOSIS — H04123 Dry eye syndrome of bilateral lacrimal glands: Secondary | ICD-10-CM | POA: Diagnosis not present

## 2016-06-19 DIAGNOSIS — M25512 Pain in left shoulder: Secondary | ICD-10-CM | POA: Diagnosis not present

## 2016-06-19 DIAGNOSIS — Z0000 Encounter for general adult medical examination without abnormal findings: Secondary | ICD-10-CM | POA: Diagnosis not present

## 2016-06-19 DIAGNOSIS — R5382 Chronic fatigue, unspecified: Secondary | ICD-10-CM | POA: Diagnosis not present

## 2016-06-19 DIAGNOSIS — E162 Hypoglycemia, unspecified: Secondary | ICD-10-CM | POA: Diagnosis not present

## 2016-06-21 DIAGNOSIS — M25512 Pain in left shoulder: Secondary | ICD-10-CM | POA: Diagnosis not present

## 2016-06-22 DIAGNOSIS — G4712 Idiopathic hypersomnia without long sleep time: Secondary | ICD-10-CM | POA: Diagnosis not present

## 2016-06-22 DIAGNOSIS — G4733 Obstructive sleep apnea (adult) (pediatric): Secondary | ICD-10-CM | POA: Diagnosis not present

## 2016-06-23 DIAGNOSIS — M25512 Pain in left shoulder: Secondary | ICD-10-CM | POA: Diagnosis not present

## 2016-06-26 DIAGNOSIS — M25512 Pain in left shoulder: Secondary | ICD-10-CM | POA: Diagnosis not present

## 2016-06-27 DIAGNOSIS — Z7189 Other specified counseling: Secondary | ICD-10-CM | POA: Diagnosis not present

## 2016-06-27 DIAGNOSIS — E785 Hyperlipidemia, unspecified: Secondary | ICD-10-CM | POA: Diagnosis not present

## 2016-06-27 DIAGNOSIS — I10 Essential (primary) hypertension: Secondary | ICD-10-CM | POA: Diagnosis not present

## 2016-06-27 DIAGNOSIS — E119 Type 2 diabetes mellitus without complications: Secondary | ICD-10-CM | POA: Diagnosis not present

## 2016-06-28 DIAGNOSIS — M25512 Pain in left shoulder: Secondary | ICD-10-CM | POA: Diagnosis not present

## 2016-07-27 DIAGNOSIS — S3991XA Unspecified injury of abdomen, initial encounter: Secondary | ICD-10-CM | POA: Diagnosis not present

## 2016-07-27 DIAGNOSIS — E119 Type 2 diabetes mellitus without complications: Secondary | ICD-10-CM | POA: Diagnosis not present

## 2016-07-27 DIAGNOSIS — F17210 Nicotine dependence, cigarettes, uncomplicated: Secondary | ICD-10-CM | POA: Diagnosis not present

## 2016-07-27 DIAGNOSIS — G4730 Sleep apnea, unspecified: Secondary | ICD-10-CM | POA: Diagnosis not present

## 2016-07-27 DIAGNOSIS — I10 Essential (primary) hypertension: Secondary | ICD-10-CM | POA: Diagnosis not present

## 2016-07-27 DIAGNOSIS — W108XXA Fall (on) (from) other stairs and steps, initial encounter: Secondary | ICD-10-CM | POA: Diagnosis not present

## 2016-07-27 DIAGNOSIS — E538 Deficiency of other specified B group vitamins: Secondary | ICD-10-CM | POA: Diagnosis not present

## 2016-07-27 DIAGNOSIS — H2512 Age-related nuclear cataract, left eye: Secondary | ICD-10-CM | POA: Diagnosis not present

## 2016-07-27 DIAGNOSIS — M5418 Radiculopathy, sacral and sacrococcygeal region: Secondary | ICD-10-CM | POA: Diagnosis not present

## 2016-07-27 DIAGNOSIS — Z8673 Personal history of transient ischemic attack (TIA), and cerebral infarction without residual deficits: Secondary | ICD-10-CM | POA: Diagnosis not present

## 2016-07-27 DIAGNOSIS — S99922A Unspecified injury of left foot, initial encounter: Secondary | ICD-10-CM | POA: Diagnosis not present

## 2016-07-27 DIAGNOSIS — M25561 Pain in right knee: Secondary | ICD-10-CM | POA: Diagnosis not present

## 2016-07-27 DIAGNOSIS — Y92008 Other place in unspecified non-institutional (private) residence as the place of occurrence of the external cause: Secondary | ICD-10-CM | POA: Diagnosis not present

## 2016-07-27 DIAGNOSIS — G2581 Restless legs syndrome: Secondary | ICD-10-CM | POA: Diagnosis not present

## 2016-07-27 DIAGNOSIS — M79672 Pain in left foot: Secondary | ICD-10-CM | POA: Diagnosis not present

## 2016-07-27 DIAGNOSIS — S948X2A Injury of other nerves at ankle and foot level, left leg, initial encounter: Secondary | ICD-10-CM | POA: Diagnosis not present

## 2016-07-27 DIAGNOSIS — S84802A Injury of other nerves at lower leg level, left leg, initial encounter: Secondary | ICD-10-CM | POA: Diagnosis not present

## 2016-07-27 DIAGNOSIS — F329 Major depressive disorder, single episode, unspecified: Secondary | ICD-10-CM | POA: Diagnosis not present

## 2016-07-27 DIAGNOSIS — S199XXA Unspecified injury of neck, initial encounter: Secondary | ICD-10-CM | POA: Diagnosis not present

## 2016-07-27 DIAGNOSIS — I4891 Unspecified atrial fibrillation: Secondary | ICD-10-CM | POA: Diagnosis not present

## 2016-07-27 DIAGNOSIS — M797 Fibromyalgia: Secondary | ICD-10-CM | POA: Diagnosis not present

## 2016-07-27 DIAGNOSIS — J45909 Unspecified asthma, uncomplicated: Secondary | ICD-10-CM | POA: Diagnosis not present

## 2016-07-27 DIAGNOSIS — S8991XA Unspecified injury of right lower leg, initial encounter: Secondary | ICD-10-CM | POA: Diagnosis not present

## 2016-07-27 DIAGNOSIS — S7002XA Contusion of left hip, initial encounter: Secondary | ICD-10-CM | POA: Diagnosis not present

## 2016-07-27 DIAGNOSIS — E785 Hyperlipidemia, unspecified: Secondary | ICD-10-CM | POA: Diagnosis not present

## 2016-07-27 DIAGNOSIS — G47419 Narcolepsy without cataplexy: Secondary | ICD-10-CM | POA: Diagnosis not present

## 2016-07-27 DIAGNOSIS — S3993XA Unspecified injury of pelvis, initial encounter: Secondary | ICD-10-CM | POA: Diagnosis not present

## 2016-07-27 DIAGNOSIS — S0990XA Unspecified injury of head, initial encounter: Secondary | ICD-10-CM | POA: Diagnosis not present

## 2016-07-27 DIAGNOSIS — S8492XA Injury of unspecified nerve at lower leg level, left leg, initial encounter: Secondary | ICD-10-CM | POA: Diagnosis not present

## 2016-07-27 DIAGNOSIS — K219 Gastro-esophageal reflux disease without esophagitis: Secondary | ICD-10-CM | POA: Diagnosis not present

## 2016-07-31 DIAGNOSIS — R9431 Abnormal electrocardiogram [ECG] [EKG]: Secondary | ICD-10-CM | POA: Diagnosis not present

## 2016-07-31 DIAGNOSIS — R918 Other nonspecific abnormal finding of lung field: Secondary | ICD-10-CM | POA: Diagnosis not present

## 2016-08-01 DIAGNOSIS — I10 Essential (primary) hypertension: Secondary | ICD-10-CM | POA: Diagnosis not present

## 2016-08-01 DIAGNOSIS — I495 Sick sinus syndrome: Secondary | ICD-10-CM | POA: Diagnosis not present

## 2016-08-01 DIAGNOSIS — I4891 Unspecified atrial fibrillation: Secondary | ICD-10-CM | POA: Diagnosis not present

## 2016-08-01 DIAGNOSIS — I471 Supraventricular tachycardia: Secondary | ICD-10-CM | POA: Diagnosis not present

## 2016-08-02 DIAGNOSIS — L608 Other nail disorders: Secondary | ICD-10-CM | POA: Diagnosis not present

## 2016-08-02 DIAGNOSIS — L84 Corns and callosities: Secondary | ICD-10-CM | POA: Diagnosis not present

## 2016-08-02 DIAGNOSIS — I7389 Other specified peripheral vascular diseases: Secondary | ICD-10-CM | POA: Diagnosis not present

## 2016-08-09 DIAGNOSIS — M25552 Pain in left hip: Secondary | ICD-10-CM | POA: Diagnosis not present

## 2016-08-09 DIAGNOSIS — M25551 Pain in right hip: Secondary | ICD-10-CM | POA: Diagnosis not present

## 2016-08-09 DIAGNOSIS — S7002XA Contusion of left hip, initial encounter: Secondary | ICD-10-CM | POA: Diagnosis not present

## 2016-08-09 DIAGNOSIS — M2550 Pain in unspecified joint: Secondary | ICD-10-CM | POA: Diagnosis not present

## 2016-08-11 DIAGNOSIS — R918 Other nonspecific abnormal finding of lung field: Secondary | ICD-10-CM | POA: Diagnosis not present

## 2016-08-15 DIAGNOSIS — Z7901 Long term (current) use of anticoagulants: Secondary | ICD-10-CM | POA: Diagnosis not present

## 2016-08-15 DIAGNOSIS — S99922A Unspecified injury of left foot, initial encounter: Secondary | ICD-10-CM | POA: Diagnosis not present

## 2016-08-15 DIAGNOSIS — M797 Fibromyalgia: Secondary | ICD-10-CM | POA: Diagnosis not present

## 2016-08-15 DIAGNOSIS — M7742 Metatarsalgia, left foot: Secondary | ICD-10-CM | POA: Diagnosis not present

## 2016-08-15 DIAGNOSIS — M25572 Pain in left ankle and joints of left foot: Secondary | ICD-10-CM | POA: Diagnosis not present

## 2016-08-15 DIAGNOSIS — E119 Type 2 diabetes mellitus without complications: Secondary | ICD-10-CM | POA: Diagnosis not present

## 2016-08-15 DIAGNOSIS — F17210 Nicotine dependence, cigarettes, uncomplicated: Secondary | ICD-10-CM | POA: Diagnosis not present

## 2016-08-15 DIAGNOSIS — J45909 Unspecified asthma, uncomplicated: Secondary | ICD-10-CM | POA: Diagnosis not present

## 2016-08-15 DIAGNOSIS — M899 Disorder of bone, unspecified: Secondary | ICD-10-CM | POA: Diagnosis not present

## 2016-08-15 DIAGNOSIS — Z79899 Other long term (current) drug therapy: Secondary | ICD-10-CM | POA: Diagnosis not present

## 2016-08-15 DIAGNOSIS — W1841XA Slipping, tripping and stumbling without falling due to stepping on object, initial encounter: Secondary | ICD-10-CM | POA: Diagnosis not present

## 2016-08-17 DIAGNOSIS — S93622A Sprain of tarsometatarsal ligament of left foot, initial encounter: Secondary | ICD-10-CM | POA: Diagnosis not present

## 2016-08-17 DIAGNOSIS — M79672 Pain in left foot: Secondary | ICD-10-CM | POA: Diagnosis not present

## 2016-08-21 DIAGNOSIS — H269 Unspecified cataract: Secondary | ICD-10-CM | POA: Diagnosis not present

## 2016-08-21 DIAGNOSIS — I4891 Unspecified atrial fibrillation: Secondary | ICD-10-CM | POA: Diagnosis not present

## 2016-08-21 DIAGNOSIS — Z0000 Encounter for general adult medical examination without abnormal findings: Secondary | ICD-10-CM | POA: Diagnosis not present

## 2016-08-21 DIAGNOSIS — Z01818 Encounter for other preprocedural examination: Secondary | ICD-10-CM | POA: Diagnosis not present

## 2016-08-22 DIAGNOSIS — H2512 Age-related nuclear cataract, left eye: Secondary | ICD-10-CM | POA: Diagnosis not present

## 2016-08-22 DIAGNOSIS — H2589 Other age-related cataract: Secondary | ICD-10-CM | POA: Diagnosis not present

## 2016-08-30 DIAGNOSIS — H2511 Age-related nuclear cataract, right eye: Secondary | ICD-10-CM | POA: Diagnosis not present

## 2016-09-11 DIAGNOSIS — H2511 Age-related nuclear cataract, right eye: Secondary | ICD-10-CM | POA: Diagnosis not present

## 2016-09-11 DIAGNOSIS — H269 Unspecified cataract: Secondary | ICD-10-CM | POA: Diagnosis not present

## 2016-09-19 DIAGNOSIS — Z950 Presence of cardiac pacemaker: Secondary | ICD-10-CM | POA: Diagnosis not present

## 2016-09-19 DIAGNOSIS — I495 Sick sinus syndrome: Secondary | ICD-10-CM | POA: Diagnosis not present

## 2016-09-19 DIAGNOSIS — I480 Paroxysmal atrial fibrillation: Secondary | ICD-10-CM | POA: Diagnosis not present

## 2016-10-02 DIAGNOSIS — Z961 Presence of intraocular lens: Secondary | ICD-10-CM | POA: Diagnosis not present

## 2016-10-02 DIAGNOSIS — H43813 Vitreous degeneration, bilateral: Secondary | ICD-10-CM | POA: Diagnosis not present

## 2016-10-17 ENCOUNTER — Encounter: Payer: Self-pay | Admitting: Osteopathic Medicine

## 2016-10-17 ENCOUNTER — Ambulatory Visit (INDEPENDENT_AMBULATORY_CARE_PROVIDER_SITE_OTHER): Payer: Medicare Other | Admitting: Osteopathic Medicine

## 2016-10-17 VITALS — BP 124/67 | HR 60 | Ht 64.0 in | Wt 140.0 lb

## 2016-10-17 DIAGNOSIS — N3090 Cystitis, unspecified without hematuria: Secondary | ICD-10-CM

## 2016-10-17 DIAGNOSIS — E785 Hyperlipidemia, unspecified: Secondary | ICD-10-CM

## 2016-10-17 DIAGNOSIS — F17200 Nicotine dependence, unspecified, uncomplicated: Secondary | ICD-10-CM

## 2016-10-17 DIAGNOSIS — R600 Localized edema: Secondary | ICD-10-CM | POA: Diagnosis not present

## 2016-10-17 DIAGNOSIS — I2584 Coronary atherosclerosis due to calcified coronary lesion: Secondary | ICD-10-CM

## 2016-10-17 DIAGNOSIS — R350 Frequency of micturition: Secondary | ICD-10-CM

## 2016-10-17 DIAGNOSIS — G479 Sleep disorder, unspecified: Secondary | ICD-10-CM

## 2016-10-17 DIAGNOSIS — R300 Dysuria: Secondary | ICD-10-CM

## 2016-10-17 DIAGNOSIS — G2581 Restless legs syndrome: Secondary | ICD-10-CM

## 2016-10-17 DIAGNOSIS — I2510 Atherosclerotic heart disease of native coronary artery without angina pectoris: Secondary | ICD-10-CM | POA: Diagnosis not present

## 2016-10-17 DIAGNOSIS — Z9889 Other specified postprocedural states: Secondary | ICD-10-CM

## 2016-10-17 DIAGNOSIS — Z862 Personal history of diseases of the blood and blood-forming organs and certain disorders involving the immune mechanism: Secondary | ICD-10-CM

## 2016-10-17 DIAGNOSIS — H8110 Benign paroxysmal vertigo, unspecified ear: Secondary | ICD-10-CM

## 2016-10-17 DIAGNOSIS — I480 Paroxysmal atrial fibrillation: Secondary | ICD-10-CM | POA: Insufficient documentation

## 2016-10-17 DIAGNOSIS — I4891 Unspecified atrial fibrillation: Secondary | ICD-10-CM

## 2016-10-17 DIAGNOSIS — Z9884 Bariatric surgery status: Secondary | ICD-10-CM

## 2016-10-17 DIAGNOSIS — E559 Vitamin D deficiency, unspecified: Secondary | ICD-10-CM

## 2016-10-17 HISTORY — DX: Sleep disorder, unspecified: G47.9

## 2016-10-17 HISTORY — DX: Unspecified atrial fibrillation: I48.91

## 2016-10-17 HISTORY — DX: Restless legs syndrome: G25.81

## 2016-10-17 HISTORY — DX: Nicotine dependence, unspecified, uncomplicated: F17.200

## 2016-10-17 HISTORY — DX: Atherosclerotic heart disease of native coronary artery without angina pectoris: I25.10

## 2016-10-17 HISTORY — DX: Bariatric surgery status: Z98.84

## 2016-10-17 HISTORY — DX: Hyperlipidemia, unspecified: E78.5

## 2016-10-17 LAB — POCT URINALYSIS DIPSTICK
BILIRUBIN UA: NEGATIVE
Glucose, UA: NEGATIVE
KETONES UA: NEGATIVE
Nitrite, UA: POSITIVE
Protein, UA: NEGATIVE
SPEC GRAV UA: 1.015
Urobilinogen, UA: 1
pH, UA: 7

## 2016-10-17 MED ORDER — PRAMIPEXOLE DIHYDROCHLORIDE 0.25 MG PO TABS: 0.2500 mg | ORAL_TABLET | Freq: Two times a day (BID) | ORAL | 3 refills | Status: DC

## 2016-10-17 MED ORDER — ELIQUIS 5 MG PO TABS: 5.0000 mg | ORAL_TABLET | Freq: Two times a day (BID) | ORAL | 11 refills | Status: DC

## 2016-10-17 MED ORDER — ATORVASTATIN CALCIUM 10 MG PO TABS: 10.0000 mg | ORAL_TABLET | Freq: Every day | ORAL | 3 refills | Status: DC

## 2016-10-17 MED ORDER — MODAFINIL 200 MG PO TABS: 200.0000 mg | ORAL_TABLET | Freq: Two times a day (BID) | ORAL | 5 refills | Status: DC

## 2016-10-17 MED ORDER — NITROFURANTOIN MONOHYD MACRO 100 MG PO CAPS: 100.0000 mg | ORAL_CAPSULE | Freq: Two times a day (BID) | ORAL | 0 refills | Status: DC

## 2016-10-17 MED ORDER — METOPROLOL TARTRATE 50 MG PO TABS: 50.0000 mg | ORAL_TABLET | Freq: Two times a day (BID) | ORAL | 3 refills | Status: DC

## 2016-10-17 MED ORDER — MECLIZINE HCL 12.5 MG PO TABS: 12.5000 mg | ORAL_TABLET | Freq: Three times a day (TID) | ORAL | 3 refills | Status: DC | PRN

## 2016-10-17 NOTE — Progress Notes (Signed)
HPI: Donna [name] is a 75 y.o. female  who presents to [facility] today, 10/17/16,  for chief complaint of:  Chief Complaint  Patient presents with  . Establish Care    Pleasant patient here to establish care, recently moved to the area from [place]. Multiple medical issues to address.   CARDIOVASCULAR: History of atrial fibrillation, status post pacemaker placement, reported history of MI based on EKG, patient has undergone a cardiac catheterization but no stenting. Currently on beta blocker, anticoagulation with Eliquis, statin. New problem - reports weight gain and lower extremity bilateral swelling over the past 2 weeks or so. No chest pain or shortness of breath. No history of CHF. No history of DVT, recent long car rides back and forth [place] for moving, reports that she and her companion were getting out of the car every 2-3 hours and walking, she is already on anticoagulation.  RESPIRATORY: History of tobacco abuse/dependence. No diagnosis of COPD.  NEUROLOGICAL: History of sleep apnea and narcolepsy. Patient is on maximum dose of Provigil, requests refill of this today. Also reports history of restless leg, requests refill of Mirapex. Occasional vertigo problems, well controlled on meclizine.  URINARY: New problem, today reports UTI type symptoms, dysuria and frequency. No fever or flank pain.  REPRODUCTIVE: Status post hysterectomy  GASTROINTESTINAL: Status post gastric bypass.  HEMONC: Taking iron, history of anemia.    Past medical, surgical, social and family history reviewed: There are no active problems to display for this patient.  No past surgical history on file. Social History  Substance Use Topics  . Smoking status: Never Smoker  . Smokeless tobacco: Never Used  . Alcohol use Not on file   No family history on file.   Current medication list and allergy/intolerance information reviewed:   Current  Outpatient Prescriptions  Medication Sig Dispense Refill  . atorvastatin (LIPITOR) 10 MG tablet Take 10 mg by mouth daily.    . Cholecalciferol (VITAMIN D3) 5000 units CAPS Take by mouth.    . co-enzyme Q-10 30 MG capsule Take 30 mg by mouth 3 (three) times daily.    [name] ELIQUIS 5 MG TABS tablet   5  . Ferrous Sulfate (IRON) 325 (65 Fe) MG TABS Take by mouth.    . folic acid (FOLVITE) Q000111Q MCG tablet Take 800 mcg by mouth daily.    . meclizine (ANTIVERT) 12.5 MG tablet   5  . metoprolol (LOPRESSOR) 50 MG tablet Take 50 mg by mouth 2 (two) times daily.    . modafinil (PROVIGIL) 200 MG tablet   5  . pramipexole (MIRAPEX) 0.25 MG tablet   5   No current facility-administered medications for this visit.    Allergies  Allergen Reactions  . Penicillins   . Vancomycin       Review of Systems:  Constitutional:  No  fever, no chills, No recent illness, +unintentional weight changes. +significant fatigue.   HEENT: No  headache, no vision change, no hearing change, No sore throat, No  sinus pressure  Cardiac: No  chest pain, No  pressure, No palpitations, No  Orthopnea  Respiratory:  No  shortness of breath. No  Cough  Gastrointestinal: No  abdominal pain, No  nausea, No  vomiting,  No  blood in stool, No  diarrhea, No  constipation   Musculoskeletal: No new myalgia/arthralgia  Genitourinary: No  incontinence, No  abnormal genital bleeding, No abnormal genital discharge, +dysuria and frequency  Skin: No  Rash, No other  wounds/concerning lesions  Hem/Onc: No  easy bruising/bleeding, No  abnormal lymph node  Endocrine: No cold intolerance,  No heat intolerance. No polyuria/polydipsia/polyphagia   Neurologic: No  weakness, No  dizziness  Psychiatric: No  concerns with depression, No  concerns with anxiety  Exam:  BP 124/67   Pulse 60   Ht 5\' 4"  (1.626 m)   Wt 140 lb (63.5 kg)   BMI 24.03 kg/m   Constitutional: VS see above. General Appearance: alert, well-developed,  well-nourished, NAD  Eyes: Normal lids and conjunctive, non-icteric sclera  Ears, Nose, Mouth, Throat: MMM, Normal external inspection ears/nares/mouth/lips/gums. TM normal bilaterally. Pharynx/tonsils no erythema, no exudate. Nasal mucosa normal.   Neck: No masses, trachea midline. No thyroid enlargement. No tenderness/mass appreciated. No lymphadenopathy  Respiratory: Normal respiratory effort. no wheeze, no rhonchi, no rales  Cardiovascular: S1/S2 normal, no murmur, no rub/gallop auscultated. RRR. +1 pitting LE edema distal to knees, slightly more pronounced on R side. Pedal pulse II/IV bilaterally DP and PT. No carotid bruit or JVD. No abdominal aortic bruit.  Gastrointestinal: Nontender, no masses.   Musculoskeletal: Gait normal. No clubbing/cyanosis of digits.   Neurological: Normal balance/coordination. No tremor. No cranial nerve deficit on limited exam. Motor and sensation intact and symmetric. Cerebellar reflexes intact.   Skin: warm, dry, intact. No rash/ulcer    Psychiatric: Normal judgment/insight. Normal mood and affect. Oriented x3.     ASSESSMENT/PLAN:   Coronary atherosclerosis due to calcified coronary lesion - Refer to cardiology to establish care here, consider ACE inhibitor but BP is well-controlled. - Plan: Ambulatory referral to Cardiology  Atrial fibrillation, unspecified type ([place]) - On chronic anticoagulation - Plan: ELIQUIS 5 MG TABS tablet, metoprolol (LOPRESSOR) 50 MG tablet, Ambulatory referral to Cardiology  Lower extremity edema - New problem. Unlikely DVT given that she is already anticoagulated, no clinical CHF, consider echo or PVD workup - Plan: B Nat Peptide, CBC, COMPLETE METABOLIC PANEL WITH GFR, TSH  Hyperlipidemia, unspecified hyperlipidemia type - Plan: atorvastatin (LIPITOR) 10 MG tablet  History of gastric bypass - Patient reports most recent labs were normal, no major nutrient deficiencies.  Restless leg syndrome - Plan: pramipexole  (MIRAPEX) 0.25 MG tablet  Sleep disorder - Plan: modafinil (PROVIGIL) 200 MG tablet  Benign paroxysmal positional vertigo, unspecified laterality - Plan: meclizine (ANTIVERT) 12.5 MG tablet  Tobacco dependence  Urinary frequency - Plan: POCT Urinalysis Dipstick  Dysuria - Plan: Urine Culture  Cystitis - Plan: nitrofurantoin, macrocrystal-monohydrate, (MACROBID) 100 MG capsule  Vitamin D deficiency - Plan: Cholecalciferol (VITAMIN D3) 5000 units CAPS  History of anemia - Plan: folic acid (FOLVITE) Q000111Q MCG tablet, Ferrous Sulfate (IRON) 325 (65 Fe) MG TABS    Patient Instructions  Based on lab results, we should consider ultrasound of the heart to evaluate for heart failure and/or ultrasound of the lower legs to evaluate for DVT. I doubt you have blood clots based on the fact that you're already on Eliquis but this or another circulatory issue would be something to watch out for if everything else is normal.   We are setting you up with a cardiologist - please let us know if you haven't heard back about that appointment.   For UTI, take antibiotics as directed, we will send urine for culture and call you in 2 - 3 days if we need to change antibiotics based on the culture results.     Visit summary with medication list and pertinent instructions was printed for patient to review. All questions at time  of visit were answered - patient instructed to contact office with any additional concerns. ER/RTC precautions were reviewed with the patient. Follow-up plan: Return in about 2 weeks (around 10/31/2016) for recheck swelling - sooner if needed.  Note: Total time spent 45 minutes, greater than 50% of the visit was spent face-to-face counseling and coordinating care for the following: The primary encounter diagnosis was Coronary atherosclerosis due to calcified coronary lesion. Diagnoses of Atrial fibrillation, unspecified type ([place]), Lower extremity edema, Hyperlipidemia, unspecified  hyperlipidemia type, History of gastric bypass, Restless leg syndrome, Sleep disorder, Benign paroxysmal positional vertigo, unspecified laterality, Tobacco dependence, Urinary frequency, Dysuria, Cystitis, Vitamin D deficiency, and History of anemia were also pertinent to this visit.[name]

## 2016-10-17 NOTE — Patient Instructions (Addendum)
Based on lab results, we should consider ultrasound of the heart to evaluate for heart failure and/or ultrasound of the lower legs to evaluate for DVT. I doubt you have blood clots based on the fact that you're already on Eliquis but this or another circulatory issue would be something to watch out for if everything else is normal.   We are setting you up with a cardiologist - please let us know if you haven't heard back about that appointment.   For UTI, take antibiotics as directed, we will send urine for culture and call you in 2 - 3 days if we need to change antibiotics based on the culture results.

## 2016-10-18 DIAGNOSIS — R350 Frequency of micturition: Secondary | ICD-10-CM | POA: Insufficient documentation

## 2016-10-18 DIAGNOSIS — H8110 Benign paroxysmal vertigo, unspecified ear: Secondary | ICD-10-CM | POA: Insufficient documentation

## 2016-10-18 DIAGNOSIS — R600 Localized edema: Secondary | ICD-10-CM | POA: Insufficient documentation

## 2016-10-18 DIAGNOSIS — N3090 Cystitis, unspecified without hematuria: Secondary | ICD-10-CM | POA: Insufficient documentation

## 2016-10-18 DIAGNOSIS — Z862 Personal history of diseases of the blood and blood-forming organs and certain disorders involving the immune mechanism: Secondary | ICD-10-CM | POA: Insufficient documentation

## 2016-10-18 DIAGNOSIS — N10 Acute tubulo-interstitial nephritis: Secondary | ICD-10-CM | POA: Insufficient documentation

## 2016-10-18 DIAGNOSIS — E559 Vitamin D deficiency, unspecified: Secondary | ICD-10-CM

## 2016-10-18 DIAGNOSIS — R300 Dysuria: Secondary | ICD-10-CM

## 2016-10-18 HISTORY — DX: Localized edema: R60.0

## 2016-10-18 HISTORY — DX: Benign paroxysmal vertigo, unspecified ear: H81.10

## 2016-10-18 HISTORY — DX: Frequency of micturition: R35.0

## 2016-10-18 HISTORY — DX: Vitamin D deficiency, unspecified: E55.9

## 2016-10-18 HISTORY — DX: Personal history of diseases of the blood and blood-forming organs and certain disorders involving the immune mechanism: Z86.2

## 2016-10-18 HISTORY — DX: Cystitis, unspecified without hematuria: N30.90

## 2016-10-18 HISTORY — DX: Dysuria: R30.0

## 2016-10-18 LAB — COMPLETE METABOLIC PANEL WITH GFR
ALBUMIN: 3.6 g/dL (ref 3.6–5.1)
ALK PHOS: 80 U/L (ref 33–130)
ALT: 12 U/L (ref 6–29)
AST: 16 U/L (ref 10–35)
BILIRUBIN TOTAL: 0.3 mg/dL (ref 0.2–1.2)
BUN: 12 mg/dL (ref 7–25)
CO2: 32 mmol/L — ABNORMAL HIGH (ref 20–31)
CREATININE: 0.7 mg/dL (ref 0.60–0.93)
Calcium: 9.5 mg/dL (ref 8.6–10.4)
Chloride: 103 mmol/L (ref 98–110)
GFR, Est African American: >89 mL/min (ref >=60)
GFR, Est Non African American: 87 mL/min (ref >=60)
GLUCOSE: 106 mg/dL — AB (ref 65–99)
Potassium: 4.5 mmol/L (ref 3.5–5.3)
Sodium: 142 mmol/L (ref 135–146)
TOTAL PROTEIN: 6.2 g/dL (ref 6.1–8.1)

## 2016-10-18 LAB — TSH: TSH: 2.53 mIU/L

## 2016-10-18 LAB — CBC
HCT: 41.2 % (ref 35.0–45.0)
HEMOGLOBIN: 13.4 g/dL (ref 11.7–15.5)
MCH: 30 pg (ref 27.0–33.0)
MCHC: 32.5 g/dL (ref 32.0–36.0)
MCV: 92.2 fL (ref 80.0–100.0)
MPV: 10.1 fL (ref 7.5–12.5)
Platelets: 236 10*3/uL (ref 140–400)
RBC: 4.47 MIL/uL (ref 3.80–5.10)
RDW: 13.5 % (ref 11.0–15.0)
WBC: 7.7 10*3/uL (ref 3.8–10.8)

## 2016-10-18 LAB — BRAIN NATRIURETIC PEPTIDE: BRAIN NATRIURETIC PEPTIDE: 238.4 pg/mL — AB (ref <100)

## 2016-10-19 NOTE — Addendum Note (Signed)
Addended by: Maryla Morrow on: 10/19/2016 05:58 PM   Modules accepted: Orders

## 2016-10-20 LAB — URINE CULTURE

## 2016-10-24 ENCOUNTER — Encounter: Payer: Self-pay | Admitting: Podiatry

## 2016-10-24 ENCOUNTER — Ambulatory Visit (INDEPENDENT_AMBULATORY_CARE_PROVIDER_SITE_OTHER): Payer: Medicare Other | Admitting: Podiatry

## 2016-10-24 VITALS — BP 127/74 | HR 61 | Resp 14 | Ht 64.0 in | Wt 137.0 lb

## 2016-10-24 DIAGNOSIS — I2584 Coronary atherosclerosis due to calcified coronary lesion: Secondary | ICD-10-CM | POA: Diagnosis not present

## 2016-10-24 DIAGNOSIS — I2510 Atherosclerotic heart disease of native coronary artery without angina pectoris: Secondary | ICD-10-CM | POA: Diagnosis not present

## 2016-10-24 DIAGNOSIS — B351 Tinea unguium: Secondary | ICD-10-CM

## 2016-10-24 DIAGNOSIS — M79674 Pain in right toe(s): Secondary | ICD-10-CM

## 2016-10-24 DIAGNOSIS — L84 Corns and callosities: Secondary | ICD-10-CM

## 2016-10-24 DIAGNOSIS — M79675 Pain in left toe(s): Secondary | ICD-10-CM

## 2016-10-24 NOTE — Progress Notes (Signed)
   Subjective:    Patient ID: Donna [name], female    DOB: 1941/03/11, 75 y.o.   MRN: [id number]  HPI   This diet controlled diabetic presents today requesting a diabetic foot exam, debridement of toenails which are uncomfortable and trimming of callouses. Patient states that she's had this service provided by podiatrist in [place] and has relocated to [place] in the last several months. She states that she had foot surgery to correct the bunion and hammertoe on the left foot in 2016 and healed uneventfully. She also has interested in correcting right HAV and hammertoes in 2018 Patient says that she is a diabetic that responded to gastric bypass surgery in 2004 which converted to a prediabetic she states that she is not taking any medication since the weight loss Patient is a smoker since age 16 and says she is currently tapering down to 6 cigarettes daily Patient denies any history of ulceration, claudication or amputation Patient has history of A. fib and takes Eliquis   Review of Systems  All other systems reviewed and are negative.      Objective:   Physical Exam  Patient states she is approximately 5 foot 437 pounds  Orientated 3  Vascular: DP pulses 1/4 bilaterally PT pulses 1/4 bilaterally Capillary reflex immediate bilaterally  Neurological: Sensation to 10 g monofilament wire 5/5 bilaterally Vibratory sensation reactive bilaterally Ankle reflex equal reactive bilaterally  Dermatological: No open skin lesions bilaterally Atrophic skin bilaterally Keratoses distal third right toe with dried blood within keratoses Plantar callus left first MPJ Toenails are elongated, brittle, discolored, deformed 6-10 Well-healed surgical scar medial left first MPJ Well-healed surgical scar second toe  Musculoskeletal: Patient walks slowly with assistance of cane history of ataxia HAV right Hammertoe 2-4 right Hammertoe fourth left C-shaped feet bilaterally  with prominent base fifth metatarsals Manual motor testing dorsi flexion, plantar flexion, inversion, eversion 5/5 bilaterally     Assessment & Plan:   Assessment: Diet-controlled diabetic Decrease peripheral pulses bilaterally Protective sensation intact bilaterally HAV right Hammertoe 2-4 right Plantar callus 1 left Distal keratoses third right Symptomatic mycotic toenails 6-10  Plan: Debridement of toenails 6-10 mechanically an electrical without any bleeding Debrided keratoses 2 without any bleeding  Patient request return  for skin a nail debridement at three-month intervals Also, as patient is requesting evaluation for possible right foot surgery scheduled next visit with Dr. Earleen Newport or Dr. Amalia Hailey for further evaluation for possible right foot surgery

## 2016-10-24 NOTE — Patient Instructions (Signed)

## 2016-10-26 ENCOUNTER — Ambulatory Visit (HOSPITAL_BASED_OUTPATIENT_CLINIC_OR_DEPARTMENT_OTHER)
Admission: RE | Admit: 2016-10-26 | Discharge: 2016-10-26 | Disposition: A | Discharge status: Home / Self Care | Payer: Medicare Other | Source: Ambulatory Visit | Attending: Osteopathic Medicine | Admitting: Osteopathic Medicine

## 2016-10-26 DIAGNOSIS — R030 Elevated blood-pressure reading, without diagnosis of hypertension: Secondary | ICD-10-CM | POA: Insufficient documentation

## 2016-10-26 DIAGNOSIS — I2584 Coronary atherosclerosis due to calcified coronary lesion: Secondary | ICD-10-CM | POA: Insufficient documentation

## 2016-10-26 DIAGNOSIS — R600 Localized edema: Secondary | ICD-10-CM | POA: Diagnosis not present

## 2016-10-26 DIAGNOSIS — I4891 Unspecified atrial fibrillation: Secondary | ICD-10-CM | POA: Insufficient documentation

## 2016-10-26 DIAGNOSIS — I2510 Atherosclerotic heart disease of native coronary artery without angina pectoris: Secondary | ICD-10-CM

## 2016-10-26 DIAGNOSIS — E785 Hyperlipidemia, unspecified: Secondary | ICD-10-CM | POA: Diagnosis not present

## 2016-10-26 DIAGNOSIS — Z87891 Personal history of nicotine dependence: Secondary | ICD-10-CM | POA: Diagnosis not present

## 2016-10-26 NOTE — Progress Notes (Signed)
  Echocardiogram 2D Echocardiogram has been performed.  Jennette Dubin 10/26/2016, 8:50 AM

## 2016-10-30 ENCOUNTER — Ambulatory Visit (INDEPENDENT_AMBULATORY_CARE_PROVIDER_SITE_OTHER): Payer: Medicare Other | Admitting: Cardiology

## 2016-10-30 ENCOUNTER — Encounter: Payer: Self-pay | Admitting: Cardiology

## 2016-10-30 VITALS — BP 138/100 | HR 63 | Ht 64.0 in | Wt 140.8 lb

## 2016-10-30 DIAGNOSIS — I2584 Coronary atherosclerosis due to calcified coronary lesion: Secondary | ICD-10-CM | POA: Diagnosis not present

## 2016-10-30 DIAGNOSIS — I2510 Atherosclerotic heart disease of native coronary artery without angina pectoris: Secondary | ICD-10-CM

## 2016-10-30 DIAGNOSIS — I495 Sick sinus syndrome: Secondary | ICD-10-CM | POA: Diagnosis not present

## 2016-10-30 DIAGNOSIS — I480 Paroxysmal atrial fibrillation: Secondary | ICD-10-CM

## 2016-10-30 MED ORDER — FUROSEMIDE 20 MG PO TABS: 20.0000 mg | ORAL_TABLET | ORAL | 0 refills | Status: DC

## 2016-10-30 NOTE — Patient Instructions (Addendum)
Medication Instructions:    Your physician has recommended you make the following change in your medication:  1) START Lasix 20 mg -- take this medication once daily for 3 days.  If no improvement you may take a 4th day if necessary.  If need to take more than 3-4 days, please call the office to discuss  --- If you need a refill on your cardiac medications before your next appointment, please call your pharmacy. ---  Labwork:  None ordered  Testing/Procedures:  None ordered  Follow-Up:  Your physician recommends that you schedule a follow-up appointment in: 3 months with Dr. Curt Bears.   Thank you for choosing [facility]!!   Trinidad Curet, RN [phone]

## 2016-10-30 NOTE — Progress Notes (Signed)
Electrophysiology Office Note   Date:  10/30/2016   ID:  Donna [name], DOB [birth year], MRN [id number]  PCP:  [name], DO  Primary Electrophysiologist:  Alleyne Lac Meredith Leeds, MD    Chief Complaint  Patient presents with  . Pacemaker Check    AFib     History of Present Illness: Donna [name] is a 75 y.o. female who presents today for electrophysiology evaluation.   She has a history of atrial fibrillation, and a pacemaker placed for her atrial fibrillation. She has had multiple hospitalizations and cardioversion for her atrial fibrillation. She presents today feeling well. She has had some lower extremity edema and has gained up to 7 pounds over the last 2 months. She did move from [place] 2 months ago. She is complaining that she has had some fatigue and shortness of breath over this time. She has been wearing support stockings which she says are uncomfortable, but she feels has helped with some of her edema. She also says that her pants have been fitting tighter than usual. She has no shortness of breath, chest pain, PND, orthopnea prior to her move from [place].   Today, she denies symptoms of palpitations, chest pain, orthopnea, PND, lower extremity edema, claudication, dizziness, presyncope, syncope, bleeding, or neurologic sequela. The patient is tolerating medications without difficulties and is otherwise without complaint today.    Past Medical History:  Diagnosis Date  . Atrial fibrillation ([place]) 10/17/2016   On Metoprolol, Eliquis  . Benign paroxysmal positional vertigo 10/18/2016  . CAD (coronary atherosclerotic disease) 10/17/2016   Hx MI  . Cystitis 10/18/2016  . Dysuria 10/18/2016  . History of anemia 10/18/2016  . History of gastric bypass 10/17/2016  . Hyperlipidemia 10/17/2016  . Lower extremity edema 10/18/2016  . Restless leg syndrome 10/17/2016  . Sleep disorder 10/17/2016   Modafinil for sleep apnea and narcolepsy  .  Tobacco dependence 10/17/2016  . Urinary frequency 10/18/2016  . Vitamin D deficiency 10/18/2016   Past Surgical History:  Procedure Laterality Date  . CARDIAC CATHETERIZATION       Current Outpatient Prescriptions  Medication Sig Dispense Refill  . atorvastatin (LIPITOR) 10 MG tablet Take 1 tablet (10 mg total) by mouth daily. 90 tablet 3  . Cholecalciferol (VITAMIN D3) 5000 units CAPS Take by mouth.    . co-enzyme Q-10 30 MG capsule Take 30 mg by mouth 3 (three) times daily.    [name] ELIQUIS 5 MG TABS tablet Take 1 tablet (5 mg total) by mouth 2 (two) times daily. 60 tablet 11  . Ferrous Sulfate (IRON) 325 (65 Fe) MG TABS Take by mouth.    . folic acid (FOLVITE) Q000111Q MCG tablet Take 800 mcg by mouth daily.    . meclizine (ANTIVERT) 12.5 MG tablet Take 12.5 mg by mouth 2 (two) times daily.    . metoprolol (LOPRESSOR) 50 MG tablet Take 1 tablet (50 mg total) by mouth 2 (two) times daily. 90 tablet 3  . modafinil (PROVIGIL) 200 MG tablet Take 1 tablet (200 mg total) by mouth 2 (two) times daily. 60 tablet 5  . pramipexole (MIRAPEX) 0.25 MG tablet Take 1 tablet (0.25 mg total) by mouth 2 (two) times daily. 180 tablet 3  . furosemide (LASIX) 20 MG tablet Take 1 tablet (20 mg total) by mouth as directed. Take as directed 20 tablet 0   No current facility-administered medications for this visit.     Allergies:   Penicillins and Vancomycin   Social History:  The patient  reports that she has been smoking.  She has a 55.00 pack-year smoking history. She has never used smokeless tobacco. She reports that she does not drink alcohol or use drugs.   Family History:  The patient's family history includes Bladder Cancer in her brother; Diabetes in her father; Emphysema in her father and mother; Heart attack in her maternal grandmother; Heart disease in her father; Hypertension in her brother, brother, and sister; Kidney failure in her paternal grandmother; Stroke in her paternal grandfather.    ROS:   Please see the history of present illness.   Otherwise, review of systems is positive for Chills, fatigue, leg pain, palpitations, leg swelling, back pain, dizziness, balance problems.   All other systems are reviewed and negative.    PHYSICAL EXAM: VS:  BP (!) 138/100   Pulse 63   Ht 5\' 4"  (1.626 m)   Wt 140 lb 12.8 oz (63.9 kg)   BMI 24.17 kg/m  , BMI Body mass index is 24.17 kg/m. GEN: Well nourished, well developed, in no acute distress  HEENT: normal  Neck: no JVD, carotid bruits, or masses Cardiac: RRR; no murmurs, rubs, or gallops,no edema  Respiratory:  clear to auscultation bilaterally, normal work of breathing GI: soft, nontender, nondistended, + BS MS: no deformity or atrophy  Skin: warm and dry,  device pocket is well healed Neuro:  Strength and sensation are intact Psych: euthymic mood, full affect  EKG:  EKG is ordered today. Personal review of the ekg ordered shows sinus rhythm, rate 63   Device interrogation is reviewed today in detail.  See PaceArt for details.   Recent Labs: 10/17/2016: ALT 12; Brain Natriuretic Peptide 238.4; BUN 12; Creat 0.70; Hemoglobin 13.4; Platelets 236; Potassium 4.5; Sodium 142; TSH 2.53    Lipid Panel  No results found for: CHOL, TRIG, HDL, CHOLHDL, VLDL, LDLCALC, LDLDIRECT   Wt Readings from Last 3 Encounters:  10/30/16 140 lb 12.8 oz (63.9 kg)  10/24/16 137 lb (62.1 kg)  10/17/16 140 lb (63.5 kg)      Other studies Reviewed: Additional studies/ records that were reviewed today include: TTE 10/26/16  Review of the above records today demonstrates:  - Left ventricle: The cavity size was normal. Wall thickness was   increased in a pattern of mild LVH. Systolic function was normal.   The estimated ejection fraction was in the range of 55% to 60%.   Wall motion was normal; there were no regional wall motion   abnormalities. Features are consistent with a pseudonormal left   ventricular filling pattern, with concomitant  abnormal relaxation   and increased filling pressure (grade 2 diastolic dysfunction). - Aortic valve: Trileaflet; moderately calcified leaflets.   Sclerosis without stenosis. - Mitral valve: Moderately calcified annulus. Mildly calcified   leaflets . There was trivial regurgitation. - Left atrium: The atrium was moderately dilated. - Right ventricle: The cavity size was mildly dilated. Pacer wire   or catheter noted in right ventricle. Systolic function was   normal. - Right atrium: The atrium was moderately dilated. - Tricuspid valve: Peak RV-RA gradient (S): 26 mm Hg. - Pulmonary arteries: PA peak pressure: 29 mm Hg (S). - Inferior vena cava: The vessel was normal in size. The   respirophasic diameter changes were in the normal range (>= 50%),   consistent with normal central venous pressure.   ASSESSMENT AND PLAN:  1.  Atrial fibrillation: Currently in sinus rhythm on the rhythm control medications. She is anticoagulated with apixaban. We'll not  make any further changes.  2. Pacemaker: Stable lead parameters with a 7 year battery life. No changes made today.  3. Hypertension: Well controlled today.  4. Diastolic heart failure: She does have lower extremity edema, which is likely caused by her diastolic heart failure. I've given her 20 mg of Lasix a take for the next 3 days. She Aniruddh Ciavarella weigh herself at the end of 3 days to determine if she needs to continue taking Lasix.    Current medicines are reviewed at length with the patient today.   The patient does not have concerns regarding her medicines.  The following changes were made today:  lasix  Labs/ tests ordered today include:  Orders Placed This Encounter  Procedures  . EKG 12-Lead     Disposition:   FU with Thora Scherman 3 months  Signed, Maira Christon Meredith Leeds, MD  10/30/2016 4:56 PM     [place] [address] [place] [address] [phone] (office) [phone] (fax)

## 2016-10-31 LAB — CUP PACEART INCLINIC DEVICE CHECK
Battery Remaining Longevity: 84 mo
Implantable Lead Implant Date: 20150324
Implantable Lead Location: 753859
Implantable Lead Model: 4456
Implantable Lead Model: 4479
Lead Channel Impedance Value: 487 Ohm
Lead Channel Pacing Threshold Amplitude: 0.9 V
Lead Channel Pacing Threshold Pulse Width: 0.4 ms
Lead Channel Sensing Intrinsic Amplitude: 15.9 mV
Lead Channel Setting Pacing Amplitude: 2 V
Lead Channel Setting Pacing Amplitude: 2 V
Lead Channel Setting Pacing Pulse Width: 0.4 ms
MDC IDC LEAD IMPLANT DT: 20150324
MDC IDC LEAD LOCATION: 753860
MDC IDC LEAD SERIAL: 479154
MDC IDC LEAD SERIAL: 531114
MDC IDC MSMT LEADCHNL RA PACING THRESHOLD AMPLITUDE: 0.6 V
MDC IDC MSMT LEADCHNL RA PACING THRESHOLD PULSEWIDTH: 0.5 ms
MDC IDC MSMT LEADCHNL RA SENSING INTR AMPL: 4 mV
MDC IDC MSMT LEADCHNL RV IMPEDANCE VALUE: 537 Ohm
MDC IDC PG IMPLANT DT: 20150324
MDC IDC PG SERIAL: 391090
MDC IDC SESS DTM: 20171211050000
MDC IDC SET LEADCHNL RV SENSING SENSITIVITY: 2.5 mV

## 2016-11-02 ENCOUNTER — Telehealth: Payer: Self-pay | Admitting: Cardiology

## 2016-11-02 NOTE — Telephone Encounter (Signed)
Records received From [facility] gave to [facility]

## 2016-11-07 ENCOUNTER — Telehealth: Payer: Self-pay | Admitting: Cardiology

## 2016-11-07 NOTE — Telephone Encounter (Signed)
New message    Patient seen Dr. Curt Bears on 12.11   Pt c/o medication issue:  1. Name of Medication: furosemide (LASIX) 20 MG tablet  2. How are you currently taking this medication (dosage and times per day)? Once in a day   3. Are you having a reaction (difficulty breathing--STAT)? Legs cramps  4. What is your medication issue? Wants to discuss medication with nurse

## 2016-11-07 NOTE — Telephone Encounter (Addendum)
Pt is going to intake extra potassium for the next several days to see if improvement in cramps.  She will call office back if not feeling better in the next day/two.    She also states weight is same and BLEE has not improved.  Will discuss with Camnitz and call her with instructions. She is agreeable to plan.

## 2016-11-08 ENCOUNTER — Encounter: Payer: Self-pay | Admitting: Osteopathic Medicine

## 2016-11-08 ENCOUNTER — Ambulatory Visit (INDEPENDENT_AMBULATORY_CARE_PROVIDER_SITE_OTHER): Payer: Medicare Other | Admitting: Osteopathic Medicine

## 2016-11-08 ENCOUNTER — Encounter: Payer: Self-pay | Admitting: Nurse Practitioner

## 2016-11-08 VITALS — BP 123/70 | HR 62 | Ht 64.0 in | Wt 140.0 lb

## 2016-11-08 DIAGNOSIS — N3000 Acute cystitis without hematuria: Secondary | ICD-10-CM

## 2016-11-08 DIAGNOSIS — I2510 Atherosclerotic heart disease of native coronary artery without angina pectoris: Secondary | ICD-10-CM

## 2016-11-08 DIAGNOSIS — R159 Full incontinence of feces: Secondary | ICD-10-CM

## 2016-11-08 DIAGNOSIS — I2584 Coronary atherosclerosis due to calcified coronary lesion: Secondary | ICD-10-CM

## 2016-11-08 DIAGNOSIS — I5032 Chronic diastolic (congestive) heart failure: Secondary | ICD-10-CM | POA: Diagnosis not present

## 2016-11-08 MED ORDER — NITROFURANTOIN MONOHYD MACRO 100 MG PO CAPS: 100.0000 mg | ORAL_CAPSULE | Freq: Two times a day (BID) | ORAL | 0 refills | Status: DC

## 2016-11-08 NOTE — Progress Notes (Signed)
HPI: Donna [name] is a 75 y.o. female  who presents to [facility] today, 11/08/16,  for chief complaint of:  Chief Complaint  Patient presents with  . Other    URINE ODOR    UTI . Context: UTI last month feels like it's back. . Quality: urine odor, dysuria . Modifying factors: recently on nitrofurnatoin, which helped for awhile but UTI is back now  . Assoc signs/symptoms: Occasional fecal incontinence, diarrhea  GI: new problem occasional fecal incontinence, pt has not reported this before, notes going on for some time, will have accidents, wears pads, thinks this may be contributing to UTI.   Cardiac: records reviewed 12/11. Pacemaker stable. Hypertension stable. Diastolic heart failure likely cause of lower extremity edema, trial 20 mg Lasix 3 days.     Past medical, surgical, social and family history reviewed: Patient Active Problem List   Diagnosis Date Noted  . Benign paroxysmal positional vertigo 10/18/2016  . Lower extremity edema 10/18/2016  . Urinary frequency 10/18/2016  . Dysuria 10/18/2016  . Cystitis 10/18/2016  . Vitamin D deficiency 10/18/2016  . History of anemia 10/18/2016  . Restless leg syndrome 10/17/2016  . CAD (coronary atherosclerotic disease) 10/17/2016  . Atrial fibrillation ([place]) 10/17/2016  . History of gastric bypass 10/17/2016  . Hyperlipidemia 10/17/2016  . Sleep disorder 10/17/2016  . Tobacco dependence 10/17/2016   Past Surgical History:  Procedure Laterality Date  . CARDIAC CATHETERIZATION     Social History  Substance Use Topics  . Smoking status: Current Every Day Smoker    Packs/day: 1.00    Years: 55.00  . Smokeless tobacco: Never Used  . Alcohol use No   Family History  Problem Relation Age of Onset  . Emphysema Mother   . Heart disease Father   . Diabetes Father   . Emphysema Father   . Hypertension Sister   . Bladder Cancer Brother   . Hypertension Brother   . Heart attack  Maternal Grandmother   . Kidney failure Paternal Grandmother   . Stroke Paternal Grandfather   . Hypertension Brother      Current medication list and allergy/intolerance information reviewed:   Current Outpatient Prescriptions on File Prior to Visit  Medication Sig Dispense Refill  . atorvastatin (LIPITOR) 10 MG tablet Take 1 tablet (10 mg total) by mouth daily. 90 tablet 3  . Cholecalciferol (VITAMIN D3) 5000 units CAPS Take by mouth.    . co-enzyme Q-10 30 MG capsule Take 30 mg by mouth 3 (three) times daily.    [name] ELIQUIS 5 MG TABS tablet Take 1 tablet (5 mg total) by mouth 2 (two) times daily. 60 tablet 11  . Ferrous Sulfate (IRON) 325 (65 Fe) MG TABS Take by mouth.    . folic acid (FOLVITE) Q000111Q MCG tablet Take 800 mcg by mouth daily.    . furosemide (LASIX) 20 MG tablet Take 1 tablet (20 mg total) by mouth as directed. Take as directed 20 tablet 0  . meclizine (ANTIVERT) 12.5 MG tablet Take 12.5 mg by mouth 2 (two) times daily.    . metoprolol (LOPRESSOR) 50 MG tablet Take 1 tablet (50 mg total) by mouth 2 (two) times daily. 90 tablet 3  . modafinil (PROVIGIL) 200 MG tablet Take 1 tablet (200 mg total) by mouth 2 (two) times daily. 60 tablet 5  . pramipexole (MIRAPEX) 0.25 MG tablet Take 1 tablet (0.25 mg total) by mouth 2 (two) times daily. 180 tablet 3   No  current facility-administered medications on file prior to visit.    Allergies  Allergen Reactions  . Penicillins   . Vancomycin       Review of Systems:  Constitutional: No fever  Cardiac: No  chest pain,  Respiratory:  No  shortness of breath.   Gastrointestinal: No  abdominal pain, +loose stool as per HPI  Musculoskeletal: No new myalgia/arthralgia, no flank/back pain   Neurologic: No  weakness, No  Dizziness   Exam:  BP 123/70   Pulse 62   Ht 5\' 4"  (1.626 m)   Wt 140 lb (63.5 kg)   BMI 24.03 kg/m   Constitutional: VS see above. General Appearance: alert, well-developed, well-nourished, NAD  Eyes:  Normal lids and conjunctive, non-icteric sclera  Ears, Nose, Mouth, Throat: MMM, Normal external inspection ears/nares/mouth/lips/gums.   Neck: No masses, trachea midline.   Respiratory: Normal respiratory effort  Musculoskeletal: Gait normal. Symmetric and independent movement of all extremities  Neurological: Normal balance/coordination. No tremor.  Skin: warm, dry, intact.   Psychiatric: Normal judgment/insight. Normal mood and affect. Oriented x3.    No results found for this or any previous visit (from the past 72 hour(s)).   Recent Results (from the past 2160 hour(s))  Urine Culture     Status: None   Collection Time: 10/17/16  2:15 PM  Result Value Ref Range   Culture ESCHERICHIA COLI    Colony Count Greater than 100,000 CFU/mL    Organism ID, Bacteria ESCHERICHIA COLI       Susceptibility   Escherichia coli -  (no method available)    AMPICILLIN >=32 Resistant     AMOX/CLAVULANIC 4 Sensitive     AMPICILLIN/SULBACTAM 16 Intermediate     PIP/TAZO <=4 Sensitive     IMIPENEM <=0.25 Sensitive     CEFAZOLIN <=4 Not Reportable     CEFTRIAXONE <=1 Sensitive     CEFTAZIDIME <=1 Sensitive     CEFEPIME <=1 Sensitive     GENTAMICIN <=1 Sensitive     TOBRAMYCIN <=1 Sensitive     CIPROFLOXACIN >=4 Resistant     LEVOFLOXACIN >=8 Resistant     NITROFURANTOIN <=16 Sensitive     TRIMETH/SULFA* <=20 Sensitive      * NR=NOT REPORTABLE,SEE COMMENTORAL therapy:A cefazolin MIC of <32 predicts susceptibility to the oral agents cefaclor,cefdinir,cefpodoxime,cefprozil,cefuroxime,cephalexin,and loracarbef when used for therapy of uncomplicated UTIs due to E.coli,K.pneumomiae,and P.mirabilis. PARENTERAL therapy: A cefazolinMIC of >8 indicates resistance to parenteralcefazolin. An alternate test method must beperformed to confirm susceptibility to parenteralcefazolin.  POCT Urinalysis Dipstick     Status: Abnormal   Collection Time: 10/17/16  2:18 PM  Result Value Ref Range   Color, UA  YELLOW    Clarity, UA CLOUDY    Glucose, UA NEGATIVE    Bilirubin, UA NEGATIVE    Ketones, UA NEGATIVE    Spec Grav, UA 1.015    Blood, UA SMALL    pH, UA 7.0    Protein, UA NEGATIVE    Urobilinogen, UA 1.0    Nitrite, UA POSITIVE    Leukocytes, UA small (1+) (A) Negative  B Nat Peptide     Status: Abnormal   Collection Time: 10/17/16  2:27 PM  Result Value Ref Range   Brain Natriuretic Peptide 238.4 (H) <100 pg/mL    Comment:   BNP levels increase with age in the general population with the highest values seen in individuals greater than 71 years of age. Reference: Joellyn Rued Cardiol 2002; [id number].     CBC  Status: None   Collection Time: 10/17/16  2:27 PM  Result Value Ref Range   WBC 7.7 3.8 - 10.8 K/uL   RBC 4.47 3.80 - 5.10 MIL/uL   Hemoglobin 13.4 11.7 - 15.5 g/dL   HCT 41.2 35.0 - 45.0 %   MCV 92.2 80.0 - 100.0 fL   MCH 30.0 27.0 - 33.0 pg   MCHC 32.5 32.0 - 36.0 g/dL   RDW 13.5 11.0 - 15.0 %   Platelets 236 140 - 400 K/uL   MPV 10.1 7.5 - 12.5 fL  COMPLETE METABOLIC PANEL WITH GFR     Status: Abnormal   Collection Time: 10/17/16  2:27 PM  Result Value Ref Range   Sodium 142 135 - 146 mmol/L   Potassium 4.5 3.5 - 5.3 mmol/L   Chloride 103 98 - 110 mmol/L   CO2 32 (H) 20 - 31 mmol/L   Glucose, Bld 106 (H) 65 - 99 mg/dL   BUN 12 7 - 25 mg/dL   Creat 0.70 0.60 - 0.93 mg/dL    Comment:   For patients > or = 73 years of age: The upper reference limit for Creatinine is approximately 13% higher for people identified as African-American.      Total Bilirubin 0.3 0.2 - 1.2 mg/dL   Alkaline Phosphatase 80 33 - 130 U/L   AST 16 10 - 35 U/L   ALT 12 6 - 29 U/L   Total Protein 6.2 6.1 - 8.1 g/dL   Albumin 3.6 3.6 - 5.1 g/dL   Calcium 9.5 8.6 - 10.4 mg/dL   GFR, Est African American >89 >=60 mL/min   GFR, Est Non African American 87 >=60 mL/min  TSH     Status: None   Collection Time: 10/17/16  2:27 PM  Result Value Ref Range   TSH 2.53 mIU/L    No  results found.   ASSESSMENT/PLAN:   Repeat treatment and UCX consider abx ppx but needs GI referral to address fecal incontinence   Acute cystitis without hematuria - Plan: nitrofurantoin, macrocrystal-monohydrate, (MACROBID) 100 MG capsule, Urine Culture  Incontinence of feces, unspecified fecal incontinence type - Plan: Ambulatory referral to Gastroenterology  Chronic diastolic congestive heart failure [facility])    Patient Instructions  Please call us if you don't hear back about your GI referral    Visit summary with medication list and pertinent instructions was printed for patient to review. All questions at time of visit were answered - patient instructed to contact office with any additional concerns. ER/RTC precautions were reviewed with the patient. Follow-up plan: Return if symptoms worsen or fail to improve.

## 2016-11-08 NOTE — Patient Instructions (Signed)
Please call us if you don't hear back about your GI referral

## 2016-11-09 ENCOUNTER — Telehealth: Payer: Self-pay | Admitting: *Deleted

## 2016-11-09 NOTE — Telephone Encounter (Signed)
lmtcb See 12/19 telephone note for reference   (when pt calls back - per Dr. Curt Bears, recommend another 3 days of Lasix, increase potassium rich foods intake, and f/u appt w/ extender next week for follow up and BMET)

## 2016-11-09 NOTE — Telephone Encounter (Signed)
Advised pt to take Lasix 20 mg x 3 more days, increase potassium rich foods while taking Lasix.  Appt made w/ Donna [name] next week to follow up on BLEE and get BMET lab. Pt will call office next Tuesday if BLEE improved and does not need to follow up

## 2016-11-09 NOTE — Telephone Encounter (Signed)
lmtcb  (when pt calls back - per Dr. Curt Bears, recommend another 3 days of Lasix, increase potassium rich foods intake, and f/u appt w/ extender next week for follow up and BMET)

## 2016-11-11 LAB — URINE CULTURE

## 2016-11-14 NOTE — Progress Notes (Deleted)
Cardiology Office Note Date:  11/14/2016  Patient ID:  Donna [name] [birth year], MRN [id number] PCP:  [name], DO  Electrophysiologist  Dr. Curt Bears  ***refresh   Chief Complaint: f/u edema, lab  History of Present Illness: Donna [name] is a 75 y.o. female with history of paroxysmal Afib with multiple DCCV historically, PPM, vertigo, CAD/MI, HLD, RLS, OSA ?narcolepsy ***  She comes in today to be seen for Dr. Curt Bears, last seen by him (and was her initial visit recently moving from [place]) 10/30/16 with c/o increased LE swelling and started on short course of lasix, this was extended an additional 3 days with no reported improvement of her edema with the initial 3 day regime.   *** nacrolepsy?? *** OSA ? CPAP *** bleeding *** edema, fluid status *** needs BMET *** ?CAD *** ?HTN  AFib Hx: *** AAD *** DCCV  PPM information: BSCi dual chamber PPM, implanted 02/11/16 *** in [place]   Past Medical History:  Diagnosis Date  . Atrial fibrillation ([place]) 10/17/2016   On Metoprolol, Eliquis  . Benign paroxysmal positional vertigo 10/18/2016  . CAD (coronary atherosclerotic disease) 10/17/2016   Hx MI  . Cystitis 10/18/2016  . Dysuria 10/18/2016  . History of anemia 10/18/2016  . History of gastric bypass 10/17/2016  . Hyperlipidemia 10/17/2016  . Lower extremity edema 10/18/2016  . Restless leg syndrome 10/17/2016  . Sleep disorder 10/17/2016   Modafinil for sleep apnea and narcolepsy  . Tobacco dependence 10/17/2016  . Urinary frequency 10/18/2016  . Vitamin D deficiency 10/18/2016    Past Surgical History:  Procedure Laterality Date  . CARDIAC CATHETERIZATION      Current Outpatient Prescriptions  Medication Sig Dispense Refill  . atorvastatin (LIPITOR) 10 MG tablet Take 1 tablet (10 mg total) by mouth daily. 90 tablet 3  . Cholecalciferol (VITAMIN D3) 5000 units CAPS Take by mouth.    . co-enzyme Q-10 30 MG capsule Take 30 mg by  mouth 3 (three) times daily.    [name] ELIQUIS 5 MG TABS tablet Take 1 tablet (5 mg total) by mouth 2 (two) times daily. 60 tablet 11  . Ferrous Sulfate (IRON) 325 (65 Fe) MG TABS Take by mouth.    . folic acid (FOLVITE) Q000111Q MCG tablet Take 800 mcg by mouth daily.    . furosemide (LASIX) 20 MG tablet Take 1 tablet (20 mg total) by mouth as directed. Take as directed 20 tablet 0  . meclizine (ANTIVERT) 12.5 MG tablet Take 12.5 mg by mouth 2 (two) times daily.    . metoprolol (LOPRESSOR) 50 MG tablet Take 1 tablet (50 mg total) by mouth 2 (two) times daily. 90 tablet 3  . modafinil (PROVIGIL) 200 MG tablet Take 1 tablet (200 mg total) by mouth 2 (two) times daily. 60 tablet 5  . nitrofurantoin, macrocrystal-monohydrate, (MACROBID) 100 MG capsule Take 1 capsule (100 mg total) by mouth 2 (two) times daily. 10 capsule 0  . pramipexole (MIRAPEX) 0.25 MG tablet Take 1 tablet (0.25 mg total) by mouth 2 (two) times daily. 180 tablet 3   No current facility-administered medications for this visit.     Allergies:   Penicillins and Vancomycin   Social History:  The patient  reports that she has been smoking.  She has a 55.00 pack-year smoking history. She has never used smokeless tobacco. She reports that she does not drink alcohol or use drugs.   Family History:  The patient's family history includes Bladder Cancer in her brother; Diabetes  in her father; Emphysema in her father and mother; Heart attack in her maternal grandmother; Heart disease in her father; Hypertension in her brother, brother, and sister; Kidney failure in her paternal grandmother; Stroke in her paternal grandfather.  ROS:  Please see the history of present illness.  All other systems are reviewed and otherwise negative.   PHYSICAL EXAM: *** VS:  There were no vitals taken for this visit. BMI: There is no height or weight on file to calculate BMI. Well nourished, well developed, in no acute distress  HEENT: normocephalic, atraumatic    Neck: no JVD, carotid bruits or masses Cardiac:  ***RRR; no significant murmurs, no rubs, or gallops Lungs:  *** clear to auscultation bilaterally, no wheezing, rhonchi or rales  Abd: soft, nontender MS: no deformity or atrophy Ext: no *** edema  Skin: warm and dry, no rash Neuro:  No gross deficits appreciated Psych: euthymic mood, full affect  *** PPM site is stable, no tethering or discomfort   EKG:  Done 10/30/16 was SR PPM check done 10/30/16 with normal device function, <1% AF TTE 10/26/16  - Left ventricle: The cavity size was normal. Wall thickness was increased in a pattern of mild LVH. Systolic function was normal. The estimated ejection fraction was in the range of 55% to 60%. Wall motion was normal; there were no regional wall motion abnormalities. Features are consistent with a pseudonormal left ventricular filling pattern, with concomitant abnormal relaxation and increased filling pressure (grade 2 diastolic dysfunction). - Aortic valve: Trileaflet; moderately calcified leaflets. Sclerosis without stenosis. - Mitral valve: Moderately calcified annulus. Mildly calcified leaflets . There was trivial regurgitation. - Left atrium: The atrium was moderately dilated. - Right ventricle: The cavity size was mildly dilated. Pacer wire or catheter noted in right ventricle. Systolic function was normal. - Right atrium: The atrium was moderately dilated. - Tricuspid valve: Peak RV-RA gradient (S): 26 mm Hg. - Pulmonary arteries: PA peak pressure: 29 mm Hg (S). - Inferior vena cava: The vessel was normal in size. The respirophasic diameter changes were in the normal range (>= 50%), consistent with normal central venous pressure.  Recent Labs: 10/17/2016: ALT 12; Brain Natriuretic Peptide 238.4; BUN 12; Creat 0.70; Hemoglobin 13.4; Platelets 236; Potassium 4.5; Sodium 142; TSH 2.53  No results found for requested labs within last 8760 hours.   CrCl  cannot be calculated (Patient's most recent lab result is older than the maximum 21 days allowed.).   Wt Readings from Last 3 Encounters:  11/08/16 140 lb (63.5 kg)  10/30/16 140 lb 12.8 oz (63.9 kg)  10/24/16 137 lb (62.1 kg)     Other studies reviewed: Additional studies/records reviewed today include:    ASSESSMENT AND PLAN:  1. Paroxysmal AFib     CHA2DS2Vasc is at least 5, on Eliquis  2. PPM    *** Normal device function, no changes made today  3. Diastolic heart failure     ***  Disposition: F/u with ***  Current medicines are reviewed at length with the patient today.  The patient did not have any concerns regarding medicines.***  Signed, Jennings Books, PA-C 11/14/2016 7:57 AM     [facility] [place] [address] [phone] (office)  [phone] (fax)

## 2016-11-15 ENCOUNTER — Ambulatory Visit: Payer: Medicare Other | Admitting: Physician Assistant

## 2016-11-21 ENCOUNTER — Telehealth: Payer: Self-pay | Admitting: Osteopathic Medicine

## 2016-11-21 NOTE — Telephone Encounter (Signed)
Received form for more information filled it out and faxed it back to [facility] - CF

## 2016-11-21 NOTE — Telephone Encounter (Signed)
Received fax for prior authorization on Provigil sent through cover my meds waiting on authorization. - CF

## 2016-11-22 ENCOUNTER — Encounter: Payer: Self-pay | Admitting: Nurse Practitioner

## 2016-11-22 ENCOUNTER — Ambulatory Visit (INDEPENDENT_AMBULATORY_CARE_PROVIDER_SITE_OTHER): Payer: Medicare Other | Admitting: Nurse Practitioner

## 2016-11-22 VITALS — BP 124/64 | HR 60 | Ht 64.0 in | Wt 141.8 lb

## 2016-11-22 DIAGNOSIS — R159 Full incontinence of feces: Secondary | ICD-10-CM | POA: Diagnosis not present

## 2016-11-22 DIAGNOSIS — Z86010 Personal history of colonic polyps: Secondary | ICD-10-CM | POA: Diagnosis not present

## 2016-11-22 NOTE — Patient Instructions (Addendum)
  We will obtain your records and have you come back to discuss them.   Try over the counter Imodium:  Take one after your bowel evacuation in the AM.  Try this for a week and if no improvements then add a second Imodium in the evening.     I appreciate the opportunity to care for you.

## 2016-11-22 NOTE — Telephone Encounter (Signed)
PA has been approved for Provigil.Patient notified. Called pharmacy and left message on their vm

## 2016-11-23 NOTE — Progress Notes (Addendum)
HPI: Patient is a 76 yo Donna [name] who recently moved here from [place]. She is referred by PCP, [name], DO, for evaluation of fecal incontinence. Patient is s/p remote gastric bypass. She was followed for constipation by GI in [place]. She gives a history of colon polyps as recently as last year.   Patient has chronic urinary incontinence. She has also had problems with anal incontinence for a long time but it is getting worse. Patient used to suffer with constipation but now stools are soft, unformed. She has a couple of BMs a week. If usually takes a couple of trips to the [place] over a few hours to feel completely evacuated. Sometimes the involuntary passage of stool occurs later the same day, other times it occurs on days she doesn't have a BM. When  / if patient becomes aware of impending BM then there is urgency and sometimes incontinence.  Sometimes she is unaware that stool has leaked out. She is embarrassed to go out in public.    Past Medical History:  Diagnosis Date  . Atrial fibrillation ([place]) 10/17/2016   On Metoprolol, Eliquis  . Benign paroxysmal positional vertigo 10/18/2016  . CAD (coronary atherosclerotic disease) 10/17/2016   Hx MI  . Cystitis 10/18/2016  . History of anemia 10/18/2016  . History of gastric bypass 10/17/2016  . Hyperlipidemia 10/17/2016  . Restless leg syndrome 10/17/2016  . Sleep disorder 10/17/2016   Modafinil for sleep apnea and narcolepsy  . Tobacco dependence 10/17/2016  . Vitamin D deficiency 10/18/2016    Past Surgical History:  Procedure Laterality Date  . CARDIAC CATHETERIZATION     Family History  Problem Relation Age of Onset  . Emphysema Mother   . Heart disease Father   . Diabetes Father   . Emphysema Father   . Hypertension Sister   . Bladder Cancer Brother   . Hypertension Brother   . Heart attack Maternal Grandmother   . Kidney failure Paternal Grandmother   . Stroke Paternal Grandfather   .  Hypertension Brother    Social History  Substance Use Topics  . Smoking status: Current Every Day Smoker    Packs/day: 1.00    Years: Donna.00  . Smokeless tobacco: Never Used  . Alcohol use No   Current Outpatient Prescriptions  Medication Sig Dispense Refill  . atorvastatin (LIPITOR) 10 MG tablet Take 1 tablet (10 mg total) by mouth daily. 90 tablet 3  . Cholecalciferol (VITAMIN D3) 5000 units CAPS Take by mouth.    . co-enzyme Q-10 30 MG capsule Take 30 mg by mouth 3 (three) times daily.    [name] ELIQUIS 5 MG TABS tablet Take 1 tablet (5 mg total) by mouth 2 (two) times daily. 60 tablet 11  . Ferrous Sulfate (IRON) 325 (65 Fe) MG TABS Take by mouth.    . folic acid (FOLVITE) Q000111Q MCG tablet Take 800 mcg by mouth daily.    . furosemide (LASIX) 20 MG tablet Take 1 tablet (20 mg total) by mouth as directed. Take as directed 20 tablet 0  . loperamide (IMODIUM) 2 MG capsule Take one after AM bowel movement    . meclizine (ANTIVERT) 12.5 MG tablet Take 12.5 mg by mouth 2 (two) times daily.    . metoprolol (LOPRESSOR) 50 MG tablet Take 1 tablet (50 mg total) by mouth 2 (two) times daily. 90 tablet 3  . modafinil (PROVIGIL) 200 MG tablet Take 1 tablet (200 mg total) by mouth 2 (two) times  daily. 60 tablet 5  . nitrofurantoin, macrocrystal-monohydrate, (MACROBID) 100 MG capsule Take 1 capsule (100 mg total) by mouth 2 (two) times daily. 10 capsule 0  . pramipexole (MIRAPEX) 0.25 MG tablet Take 1 tablet (0.25 mg total) by mouth 2 (two) times daily. 180 tablet 3   No current facility-administered medications for this visit.    Allergies  Allergen Reactions  . Penicillins   . Vancomycin      Review of Systems: Positive for anxiety, arthritis, back pain, depression, fatigue, headaches, swelling of feet and legs, excessive urination and urinary leakage. All other systems reviewed and negative except where noted in HPI.    Physical Exam: BP 124/64   Pulse 60   Ht 5\' 4"  (1.626 m)   Wt 141 lb  12.8 oz (64.3 kg)   BMI 24.34 kg/m  Constitutional:  Well-developed, white Donna [name] in no acute distress. Psychiatric: Normal mood and affect. Behavior is normal. HEENT: Normocephalic and atraumatic. Conjunctivae are normal. No scleral icterus. Neck supple.  Cardiovascular: Normal rate, regular rhythm.  Pulmonary/chest: Effort normal and breath sounds normal. No wheezing, rales or rhonchi. Abdominal: Soft, nondistended, nontender. Bowel sounds active throughout. There are no masses palpable. No hepatomegaly. Rectal: reduced pelvic descent when bearing down. Sphincter tone seems adequate as does squeeze pressure.  Extremities: no edema Lymphadenopathy: No cervical adenopathy noted. Neurological: Alert and oriented to person place and time. Skin: Skin is warm and dry. No rashes noted.   ASSESSMENT AND PLAN:  76 yo Donna [name] with worsening of chronic anal incontinence. Now having frequent cystitis / UTIs,  Frequent involuntary passage of small amounts of soft stool even on days when patient feels she completely evacuates rectum. She also has longstanding urinary incontinence. Anal sphincter and squeezing pressure doesn't seem abnormal on exam though anal ultrasound / manometry would provide more definitive answers.  -to begin with I think it is worth while to firm up her stools a little. She doesn't have loose stools so will need to be cautious about causing constipation. Will start with one imodium after bowels evacuated. If stools remain soft she can increase dose to twice daily. Obviously hold for constipation.  -follow up with me in 3 weeks, or sooner if needed. In the interim we will hopefully receive GI records from [place].   Remote hx of gastric bypass surgery  History of colon polyps per patient.  -requesting records from GI in [place].   CAD/ Hx of MI  Narcolepsy, treated  Tye Savoy, NP  11/23/2016, 2:05 PM  Cc:  [name], DO   Addendum: Reviewed and  agree with initial management. If no improvement on short, planned, follow-up and prior colonoscopy unrevealing would recommend anorectal manometry  Jerene Bears, MD

## 2016-11-24 ENCOUNTER — Ambulatory Visit (INDEPENDENT_AMBULATORY_CARE_PROVIDER_SITE_OTHER): Payer: Medicare Other | Admitting: Sports Medicine

## 2016-11-24 ENCOUNTER — Encounter: Payer: Self-pay | Admitting: Sports Medicine

## 2016-11-24 DIAGNOSIS — R1031 Right lower quadrant pain: Secondary | ICD-10-CM | POA: Diagnosis not present

## 2016-11-24 DIAGNOSIS — N10 Acute tubulo-interstitial nephritis: Secondary | ICD-10-CM

## 2016-11-24 LAB — POCT URINALYSIS DIPSTICK
Bilirubin, UA: NEGATIVE
Glucose, UA: NEGATIVE
Ketones, UA: NEGATIVE
Nitrite, UA: NEGATIVE
Protein, UA: NEGATIVE
Spec Grav, UA: 1.015
Urobilinogen, UA: 1
pH, UA: 6.5

## 2016-11-24 MED ORDER — ONDANSETRON 8 MG PO TBDP: 8.0000 mg | ORAL_TABLET | Freq: Three times a day (TID) | ORAL | 3 refills | Status: AC | PRN

## 2016-11-24 MED ORDER — SULFAMETHOXAZOLE-TRIMETHOPRIM 800-160 MG PO TABS: 1.0000 | ORAL_TABLET | Freq: Two times a day (BID) | ORAL | 0 refills | Status: DC

## 2016-11-24 NOTE — Assessment & Plan Note (Addendum)
Checking routine blood work for abdominal pain however she does have significant right lower quadrant and costovertebral angle pain. She recently had an Escherichia coli urinary tract infection. I'm concerned that this has developed into a pyelonephritis. In the past grew out Escherichia coli resistant to several antibiotics including fluoroquinolones.  Urinalysis shows pyuria and blood,adding a urine culture, Septra for 14 days.  We will need to keep close follow-up. Return in 2 weeks, I would recommend imaging of her urinary tract if no better at the next visit.

## 2016-11-24 NOTE — Progress Notes (Signed)
  Subjective:    CC:  Feeling nauseated and weak  HPI: This is a pleasant 76 year old female, she has a history of vertigo, but tells me her current sensations of nausea, weakness and fatigue are different from her typical vertigo. She was treated not too long ago with Macrobid for a urinary tract infection that end up growing out Escherichia coli resistant to multiple antibiotics but sensitive to Macrobid. She had some improvement but unfortunately is having a recurrence of malodorous urine, doesn't really have any urgency, frequency. Symptoms are moderate, persistent, no vomiting, no diarrhea, no fevers, chills. No skin rash.  Past medical history:  Negative.  See flowsheet/record as well for more information.  Surgical history: Negative.  See flowsheet/record as well for more information.  Family history: Negative.  See flowsheet/record as well for more information.  Social history: Negative.  See flowsheet/record as well for more information.  Allergies, and medications have been entered into the medical record, reviewed, and no changes needed.   Review of Systems: No fevers, chills, night sweats, weight loss, chest pain, or shortness of breath.   Objective:    General: Well Developed, well nourished, and in no acute distress.  Neuro: Alert and oriented x3, extra-ocular muscles intact, sensation grossly intact.  HEENT: Normocephalic, atraumatic, pupils equal round reactive to light, neck supple, no masses, no lymphadenopathy, thyroid nonpalpable.  Skin: Warm and dry, no rashes. Cardiac: Regular rate and rhythm, no murmurs rubs or gallops, no lower extremity edema.  Respiratory: Clear to auscultation bilaterally. Not using accessory muscles, speaking in full sentences. Abdomen: Soft, tender to palpation in the right lower quadrant as well as right costovertebral angle with significant tenderness. No real guarding or rigidity. Good bowel sounds.  Patient could not urinate so we had to give  her some coffee and water and wait a great deal of time.  Urinalysis was positive with leukocytes and blood.  Impression and Recommendations:    Acute pyelonephritis, right Checking routine blood work for abdominal pain however she does have significant right lower quadrant and costovertebral angle pain. She recently had an Escherichia coli urinary tract infection. I'm concerned that this has developed into a pyelonephritis. In the past grew out Escherichia coli resistant to several antibiotics including fluoroquinolones.  Urinalysis shows pyuria and blood,adding a urine culture, Septra for 14 days.  We will need to keep close follow-up. Return in 2 weeks, I would recommend imaging of her urinary tract if no better at the next visit.  I spent 40 minutes with this patient, greater than 50% was face-to-face time counseling regarding the above diagnoses

## 2016-11-26 LAB — URINE CULTURE: Organism ID, Bacteria: NO GROWTH

## 2016-12-08 ENCOUNTER — Encounter: Payer: Self-pay | Admitting: Sports Medicine

## 2016-12-08 ENCOUNTER — Ambulatory Visit (INDEPENDENT_AMBULATORY_CARE_PROVIDER_SITE_OTHER): Payer: Medicare Other | Admitting: Sports Medicine

## 2016-12-08 DIAGNOSIS — I4891 Unspecified atrial fibrillation: Secondary | ICD-10-CM | POA: Diagnosis not present

## 2016-12-08 DIAGNOSIS — K5904 Chronic idiopathic constipation: Secondary | ICD-10-CM

## 2016-12-08 DIAGNOSIS — N10 Acute tubulo-interstitial nephritis: Secondary | ICD-10-CM

## 2016-12-08 MED ORDER — METOPROLOL TARTRATE 25 MG PO TABS: 25.0000 mg | ORAL_TABLET | Freq: Two times a day (BID) | ORAL | 0 refills | Status: DC

## 2016-12-08 NOTE — Assessment & Plan Note (Signed)
Patient does a week at a time without stooling, she does well with Benefiber, I have encouraged her to do the Benefiber more often.

## 2016-12-08 NOTE — Assessment & Plan Note (Addendum)
Having some hypoglycemia as well as lethargy on 50 mg twice a day of metoprolol. Decreasing back down to 25 mg twice a day with close follow-up. If her palpitations return we certainly could try metoprolol 37.5 mg tabs twice a day. Certainly Bystolic would be another option considering it does not past the blood-brain barrier. I also question whether her beta blocker is creating some degree of hypoglycemic unawareness, so it's too late by the time she become symptomatic.

## 2016-12-08 NOTE — Assessment & Plan Note (Signed)
Symptoms resolved with antibiotics 

## 2016-12-08 NOTE — Progress Notes (Signed)
  Subjective:    CC:  Follow-up  HPI: Abdominal pain: With flank pain and a positive urinalysis, treated as a pyelonephritis, overall resolved.  A. fib: Feel somewhat sluggish since going up to 50 mg twice a day on metoprolol. Agreeable to drop that back down. Overall she was stable for many many years on 25 mg, only had one episode of return to atrial fibrillation and she was placed back on 50 mg. She's also been complaining of some low blood sugars to the 50s, she is post gastric bypass, she tells me she does try to eat when she notes low blood sugars but often it's too late.  Lesion on thumb: Agreeable to discuss this at a future visit.  Past medical history:  Negative.  See flowsheet/record as well for more information.  Surgical history: Negative.  See flowsheet/record as well for more information.  Family history: Negative.  See flowsheet/record as well for more information.  Social history: Negative.  See flowsheet/record as well for more information.  Allergies, and medications have been entered into the medical record, reviewed, and no changes needed.   Review of Systems: No fevers, chills, night sweats, weight loss, chest pain, or shortness of breath.   Objective:    General: Well Developed, well nourished, and in no acute distress.  Neuro: Alert and oriented x3, extra-ocular muscles intact, sensation grossly intact.  HEENT: Normocephalic, atraumatic, pupils equal round reactive to light, neck supple, no masses, no lymphadenopathy, thyroid nonpalpable.  Skin: Warm and dry, no rashes. Cardiac: Regular rate and rhythm, no murmurs rubs or gallops, no lower extremity edema.  Respiratory: Clear to auscultation bilaterally. Not using accessory muscles, speaking in full sentences.  Impression and Recommendations:    Acute pyelonephritis, right Symptoms resolved with antibiotics.  Atrial fibrillation ([place]) Having some hypoglycemia as well as lethargy on 50 mg twice a day of  metoprolol. Decreasing back down to 25 mg twice a day with close follow-up. If her palpitations return we certainly could try metoprolol 37.5 mg tabs twice a day. Certainly Bystolic would be another option considering it does not past the blood-brain barrier. I also question whether her beta blocker is altering her glucose metabolism and creating her hypoglycemia in the 50s.   Chronic idiopathic constipation Patient does a week at a time without stooling, she does well with Benefiber, I have encouraged her to do the Benefiber more often.  I spent 40 minutes with this patient, greater than 50% was face-to-face time counseling regarding the above diagnoses

## 2016-12-14 ENCOUNTER — Ambulatory Visit: Payer: Medicare Other | Admitting: Sports Medicine

## 2016-12-18 ENCOUNTER — Encounter: Payer: Self-pay | Admitting: Sports Medicine

## 2016-12-18 ENCOUNTER — Ambulatory Visit (INDEPENDENT_AMBULATORY_CARE_PROVIDER_SITE_OTHER): Payer: Medicare Other | Admitting: Sports Medicine

## 2016-12-18 DIAGNOSIS — M67442 Ganglion, left hand: Secondary | ICD-10-CM

## 2016-12-18 DIAGNOSIS — M67449 Ganglion, unspecified hand: Secondary | ICD-10-CM

## 2016-12-18 NOTE — Assessment & Plan Note (Signed)
Incision with expression of mucin, with primary closure. Return in one week for suture removal.

## 2016-12-18 NOTE — Progress Notes (Signed)
   Subjective:    I'm seeing this patient as a consultation for:  Dr. [name]  CC: Cyst on thumb  HPI: This is a pleasant 76 year old female, for decades she's had what appears to be a cyst over her left interphalangeal joint. It's not tender but it does bother her.  Past medical history:  Negative.  See flowsheet/record as well for more information.  Surgical history: Negative.  See flowsheet/record as well for more information.  Family history: Negative.  See flowsheet/record as well for more information.  Social history: Negative.  See flowsheet/record as well for more information.  Allergies, and medications have been entered into the medical record, reviewed, and no changes needed.   Review of Systems: No headache, visual changes, nausea, vomiting, diarrhea, constipation, dizziness, abdominal pain, skin rash, fevers, chills, night sweats, weight loss, swollen lymph nodes, body aches, joint swelling, muscle aches, chest pain, shortness of breath, mood changes, visual or auditory hallucinations.   Objective:   General: Well Developed, well nourished, and in no acute distress.  Neuro/Psych: Alert and oriented x3, extra-ocular muscles intact, able to move all 4 extremities, sensation grossly intact. Skin: Warm and dry, no rashes noted.  Respiratory: Not using accessory muscles, speaking in full sentences, trachea midline.  Cardiovascular: Pulses palpable, no extremity edema. Abdomen: Does not appear distended. Left thumb: Fluctuant, palpable cyst over the interphalangeal joint consistent with mucinous cyst  Procedure:  Excision of 1 cm left thumb mucinous cyst Risks, benefits, and alternatives explained and consent obtained. Time out conducted. Surface prepped with alcohol. 1cc lidocaine with epinephine infiltrated in a field block. Adequate anesthesia ensured. Area prepped and draped in a sterile fashion. Blood pressure cuff inflated 200 mmHg over left wrist Excision  performed with: Using a #15 blade I made a linear incision over the cyst, then using blunt dissection I entered the cyst, expressed mucinous-type material, I then closed the cyst with #4 6-0 Vicryl sutures in a simple interrupted pattern. Blood pressure cuff release for a total tourniquet time of 5 minutes. Hemostasis achieved. Pressure dressing applied. Pt stable.  Impression and Recommendations:   This case required medical decision making of moderate complexity.  Mucinous cyst of left thumb Incision with expression of mucin, with primary closure. Return in one week for suture removal.

## 2016-12-19 ENCOUNTER — Ambulatory Visit (INDEPENDENT_AMBULATORY_CARE_PROVIDER_SITE_OTHER): Payer: Medicare Other | Admitting: Nurse Practitioner

## 2016-12-19 ENCOUNTER — Encounter: Payer: Self-pay | Admitting: Nurse Practitioner

## 2016-12-19 VITALS — BP 112/60 | HR 67 | Ht 64.0 in | Wt 146.2 lb

## 2016-12-19 DIAGNOSIS — Z86010 Personal history of colonic polyps: Secondary | ICD-10-CM

## 2016-12-19 DIAGNOSIS — K5900 Constipation, unspecified: Secondary | ICD-10-CM | POA: Diagnosis not present

## 2016-12-19 DIAGNOSIS — K219 Gastro-esophageal reflux disease without esophagitis: Secondary | ICD-10-CM | POA: Diagnosis not present

## 2016-12-19 NOTE — Progress Notes (Addendum)
     HPI: Patient is a 76 year old female who I saw earlier this month for evaluation of fecal incontinence. She also has a history of chronic constipation, GERD and iron deficiency anemia (hx of gastric bypass).   Patient relocated from [place] where she was followed by Gastroenterology for constipation and history of colon polyps. I received and reviewed records from [place]. She had a tubular adenoma removed from the ascending colon in 2005. Her last colonoscopy was June 2016.  Extent of the exam was to the cecum, prep was adequate. Some hyperplastic polyps and one small adenomatous polyp with mild dysplasia were removed.  Mild diverticulosis found.  Records will be scanned into Epic.   Patient is status post remote gastric bypass. She had an endoscopy in [place] for evaluation of GERD, bloating and are deficiency anemia on anticoagulants EGD negative other than findings of a tortuous esophagus and status post gastric bypass surgery.   Past Medical History:  Diagnosis Date  . Atrial fibrillation ([place]) 10/17/2016   On Metoprolol, Eliquis  . Benign paroxysmal positional vertigo 10/18/2016  . CAD (coronary atherosclerotic disease) 10/17/2016   Hx MI  . Cystitis 10/18/2016  . Dysuria 10/18/2016  . History of anemia 10/18/2016  . History of gastric bypass 10/17/2016  . Hyperlipidemia 10/17/2016  . Lower extremity edema 10/18/2016  . Restless leg syndrome 10/17/2016  . Sleep disorder 10/17/2016   Modafinil for sleep apnea and narcolepsy  . Tobacco dependence 10/17/2016  . Urinary frequency 10/18/2016  . Vitamin D deficiency 10/18/2016    Patient's surgical history, family medical history, social history, medications and allergies were all reviewed in Epic    Physical Exam: GENERAL:  BP 112/60   Pulse 67   Ht 5\' 4"  (1.626 m)   Wt 146 lb 3.2 oz (66.3 kg)   SpO2 98%   BMI 25.10 kg/m  well developed white female in in NAD.  PSYCH: :Pleasant,  cooperative, normal affect NEURO: Alert and oriented x 3, no focal neurologic deficits   ASSESSMENT and PLAN:  1. 76 yo female with chronic constipation, intermittent fecal seepage (?overflow). Came in for follow up after I received GI records from [place]. I reviewed office note, procedure reports and pathology reports. Currently patient's bowel movements are normal and not having any fecal seepage. Only thing she is doing differently is eating grits and taking Benefiber as needed.  - will not change anything with her regimen since she is doing well. Patient will call if she has recurrent problems.   2. GERD. Symptomatic a couple of times a week. PPIs and H2 blockers have never been helpful. She uses Tums and Gaviscon as needed.  -Reiterated anti-reflux measures. -continue prn tums , gaviscon   3. History of iron deficiency anemia. Resolved. Evaluated in [place] in 2016. She is s/p remote gastric bypass. Hgb late November was 13.4. On oral iron  4. Hx of recurrent adenomatous colon polyps. Most recent complete colonoscopy was in June 2016. Several hyperplastic polyps removed. A small ascending tubular adenoma with mild dysplasia was also removed.  -Will put patient on colonoscopy recall list for June 2019   3. Atrial Fibrillation, rate controlled. On Eliquis   I spent 30 minutes of face-to-face time with the patient. Greater than 50% of the time was spent counseling and coordinating care.  Tye Savoy , NP 12/19/2016, 2:41 PM   Addendum: Reviewed and agree with initial management. Jerene Bears, MD

## 2016-12-19 NOTE — Patient Instructions (Signed)
You will be due for a recall colonoscopy in 04/2018. We will send you a reminder in the mail when it gets closer to that time.   Call our office as needed.

## 2016-12-25 ENCOUNTER — Ambulatory Visit: Payer: Medicare Other | Admitting: Sports Medicine

## 2016-12-25 ENCOUNTER — Encounter: Payer: Self-pay | Admitting: Sports Medicine

## 2016-12-25 DIAGNOSIS — M67449 Ganglion, unspecified hand: Secondary | ICD-10-CM

## 2016-12-25 NOTE — Progress Notes (Signed)
  Subjective: 1 week post surgical excision of a mucinous cyst from the left dorsal thumb, doing well.   Objective: General: Well-developed, well-nourished, and in no acute distress. Left thumb: Incision is clean, dry, intact, sutures were removed and Dermabond applied.  Assessment/plan:   Mucinous cyst of left thumb Doing well post surgical excision. Sutures removed and Dermabond applied. Return to see me as needed, she was made aware of the 50% recurrence rate

## 2016-12-25 NOTE — Assessment & Plan Note (Signed)
Doing well post surgical excision. Sutures removed and Dermabond applied. Return to see me as needed, she was made aware of the 50% recurrence rate

## 2017-01-07 ENCOUNTER — Other Ambulatory Visit: Payer: Self-pay | Admitting: Sports Medicine

## 2017-01-07 DIAGNOSIS — I4891 Unspecified atrial fibrillation: Secondary | ICD-10-CM

## 2017-01-18 ENCOUNTER — Encounter: Payer: Self-pay | Admitting: Osteopathic Medicine

## 2017-01-18 ENCOUNTER — Ambulatory Visit (INDEPENDENT_AMBULATORY_CARE_PROVIDER_SITE_OTHER): Payer: Medicare Other | Admitting: Osteopathic Medicine

## 2017-01-18 VITALS — BP 129/70 | HR 60 | Wt 145.0 lb

## 2017-01-18 DIAGNOSIS — Z9884 Bariatric surgery status: Secondary | ICD-10-CM

## 2017-01-18 DIAGNOSIS — E161 Other hypoglycemia: Secondary | ICD-10-CM | POA: Diagnosis not present

## 2017-01-18 DIAGNOSIS — L98429 Non-pressure chronic ulcer of back with unspecified severity: Secondary | ICD-10-CM | POA: Diagnosis not present

## 2017-01-18 DIAGNOSIS — Z9889 Other specified postprocedural states: Secondary | ICD-10-CM

## 2017-01-18 DIAGNOSIS — R42 Dizziness and giddiness: Secondary | ICD-10-CM | POA: Diagnosis not present

## 2017-01-18 LAB — POCT GLYCOSYLATED HEMOGLOBIN (HGB A1C): Hemoglobin A1C: 5.7

## 2017-01-18 NOTE — Patient Instructions (Signed)
Plan:  Sugars:  Labs for gastric bypass followup and workup low blood sugar  Depending on results, may need to consider followup with endocrinologist or further testing  Keep log of sugars and meals/snacks consumed  Follow-up in one week to discuss results, sooner if needed  Lightheaded:  Labs to check for anemia and other problems  Wound:  Doesn't appear infected  Will place referral to wound care, this may benefit from their expertise and may consider hyperbaric oxygen for healing if not improved  Cyst:  Will refer to hand surgeon

## 2017-01-18 NOTE — Progress Notes (Signed)
HPI: Donna [name] is a 76 y.o. female  who presents to [facility] today, 01/18/17,  for chief complaint of:  Chief Complaint  Patient presents with  . Hypoglycemia    Sugars dropping . Quality: shaking, weak, more dizzy than usual. Checks sugar and it gets down in 40s-50s. . Duration: since quit smoking 11/20/2016 . Timing: 1-2 hours after eating, often breakfast and lunch, more days out of the week than not but not daily. Not happening at nighttime.   . Modifying factors: tried changing diet to see if it helps, tried grits and eggs and oatmeal for breakfast, for lunch will have pizza or soup or leftovers, meat and veggies for dinner and protein shakes.  . Assoc signs/symptoms: nausea. Feels like heart is pounding but not fast/slow.  BP will be high (149/80s) or low (110/50) typically checks once per day, not always when she has these episodes.   Skin concern Sore on bottom: been there since a year after surgery, was coming and going for a few years but has been worse, getting bigger. 2004 was bariatric surgery.     Patient is accompanied by husband who assists with history-taking.   Past medical history, surgical history, social history and family history reviewed.  Patient Active Problem List   Diagnosis Date Noted  . Mucinous cyst of left thumb 12/18/2016  . Chronic idiopathic constipation 12/08/2016  . Chronic diastolic congestive heart failure ([place]) 11/08/2016  . Benign paroxysmal positional vertigo 10/18/2016  . Lower extremity edema 10/18/2016  . Acute pyelonephritis, right 10/18/2016  . Vitamin D deficiency 10/18/2016  . History of anemia 10/18/2016  . Restless leg syndrome 10/17/2016  . CAD (coronary atherosclerotic disease) 10/17/2016  . Atrial fibrillation ([place]) 10/17/2016  . History of gastric bypass 10/17/2016  . Hyperlipidemia 10/17/2016  . Sleep disorder 10/17/2016  . Tobacco dependence 10/17/2016    Current  medication list and allergy/intolerance information reviewed.   Current Outpatient Prescriptions on File Prior to Visit  Medication Sig Dispense Refill  . atorvastatin (LIPITOR) 10 MG tablet Take 1 tablet (10 mg total) by mouth daily. 90 tablet 3  . Cholecalciferol (VITAMIN D3) 5000 units CAPS Take by mouth.    . co-enzyme Q-10 30 MG capsule Take 30 mg by mouth 3 (three) times daily.    [name] ELIQUIS 5 MG TABS tablet Take 1 tablet (5 mg total) by mouth 2 (two) times daily. 60 tablet 11  . Ferrous Sulfate (IRON) 325 (65 Fe) MG TABS Take by mouth.    . folic acid (FOLVITE) Q000111Q MCG tablet Take 800 mcg by mouth daily.    . furosemide (LASIX) 20 MG tablet Take 1 tablet (20 mg total) by mouth as directed. Take as directed 20 tablet 0  . loperamide (IMODIUM) 2 MG capsule Take one after AM bowel movement    . meclizine (ANTIVERT) 12.5 MG tablet Take 12.5 mg by mouth 2 (two) times daily.    . metoprolol tartrate (LOPRESSOR) 25 MG tablet TAKE ONE TABLET BY MOUTH TWICE DAILY 60 tablet 0  . modafinil (PROVIGIL) 200 MG tablet Take 1 tablet (200 mg total) by mouth 2 (two) times daily. 60 tablet 5  . ondansetron (ZOFRAN-ODT) 8 MG disintegrating tablet Take 1 tablet (8 mg total) by mouth every 8 (eight) hours as needed for nausea. 20 tablet 3  . pramipexole (MIRAPEX) 0.25 MG tablet Take 1 tablet (0.25 mg total) by mouth 2 (two) times daily. 180 tablet 3   No current facility-administered  medications on file prior to visit.    Allergies  Allergen Reactions  . Penicillins   . Vancomycin       Review of Systems:  Constitutional: No recent illness, +chills but no fever   HEENT: No  headache, no vision change  Cardiac: No  chest pain, No  pressure, No palpitations  Respiratory:  No  shortness of breath. No  Cough  Gastrointestinal: No  abdominal pain, no change on bowel habits  Musculoskeletal: No new myalgia/arthralgia  Skin: No  Rash, +concern for ulcer on bottom  Neurologic: No  weakness,  +Dizziness with standing up too quick   Exam:  BP 129/70   Pulse 60   Wt 145 lb (65.8 kg)   BMI 24.89 kg/m   Orthostatics negative   Constitutional: VS see above. General Appearance: alert, well-developed, well-nourished, NAD  Eyes: Normal lids and conjunctive, non-icteric sclera  Ears, Nose, Mouth, Throat: MMM, Normal external inspection ears/nares/mouth/lips/gums.  Neck: No masses, trachea midline.   Respiratory: Normal respiratory effort. no wheeze, no rhonchi, no rales  Cardiovascular: S1/S2 normal, no murmur, no rub/gallop auscultated. RRR.   Musculoskeletal: Gait normal. Symmetric and independent movement of all extremities  Neurological: Normal balance/coordination. No tremor.  Skin: warm, dry, intact. L gulteal cleft at base of sacrum, (+)escarring and escar without ulceration or drainage  Psychiatric: Normal judgment/insight. Normal mood and affect. Oriented x3.       ASSESSMENT/PLAN:   Idiopathic postprandial hypoglycemia - Plan: POCT HgB A1C, Glucose, C-peptide, Proinsulin, Beta-hydroxybutyric acid, Insulin antibodies, blood, Insulin, fasting  Postural lightheadedness  History of gastric bypass - Plan: CBC with Differential/Platelet, Copper, serum, CMP and Liver, Ferritin, Folate, Iron and TIBC, Lipid panel, Parathyroid hormone, intact (no Ca), VITAMIN D 25 Hydroxy (Vit-D Deficiency, Fractures), Vitamin B1, Vitamin B12, Zinc  Chronic ulcer of sacral region, unspecified ulcer stage ([place]) - Plan: Ambulatory referral to Wound Clinic    Patient Instructions  Plan:  Sugars:  Labs for gastric bypass followup and workup low blood sugar  Depending on results, may need to consider followup with endocrinologist or further testing  Keep log of sugars and meals/snacks consumed  Follow-up in one week to discuss results, sooner if needed  Lightheaded:  Labs to check for anemia and other problems  Wound:  Doesn't appear infected  Will place referral to  wound care, this may benefit from their expertise and may consider hyperbaric oxygen for healing if not improved  Cyst:  Will refer to hand surgeon    Printed sheet for patient to log BS Am, ac/qhs and log foods  Follow-up plan: Return in about 1 week (around 01/25/2017) for blood sugar review and discuss labs with Dr Sheppard Coil.  Visit summary with medication list and pertinent instructions was printed for patient to review, alert [place] if any changes needed. All questions at time of visit were answered - patient instructed to contact office with any additional concerns. ER/RTC precautions were reviewed with the patient and understanding verbalized.

## 2017-01-19 DIAGNOSIS — Z9889 Other specified postprocedural states: Secondary | ICD-10-CM | POA: Diagnosis not present

## 2017-01-19 LAB — CBC WITH DIFFERENTIAL/PLATELET
BASOS ABS: 0 {cells}/uL (ref 0–200)
Basophils Relative: 0 %
EOS ABS: 168 {cells}/uL (ref 15–500)
EOS PCT: 3 %
HEMATOCRIT: 43 % (ref 35.0–45.0)
HEMOGLOBIN: 14.2 g/dL (ref 11.7–15.5)
LYMPHS ABS: 1568 {cells}/uL (ref 850–3900)
Lymphocytes Relative: 28 %
MCH: 30 pg (ref 27.0–33.0)
MCHC: 33 g/dL (ref 32.0–36.0)
MCV: 90.7 fL (ref 80.0–100.0)
MONO ABS: 336 {cells}/uL (ref 200–950)
MPV: 10.2 fL (ref 7.5–12.5)
Monocytes Relative: 6 %
NEUTROS ABS: 3528 {cells}/uL (ref 1500–7800)
NEUTROS PCT: 63 %
Platelets: 234 10*3/uL (ref 140–400)
RBC: 4.74 MIL/uL (ref 3.80–5.10)
RDW: 13.8 % (ref 11.0–15.0)
WBC: 5.6 10*3/uL (ref 3.8–10.8)

## 2017-01-19 LAB — CMP AND LIVER
ALBUMIN: 3.8 g/dL (ref 3.6–5.1)
ALK PHOS: 62 U/L (ref 33–130)
ALT: 14 U/L (ref 6–29)
AST: 16 U/L (ref 10–35)
BILIRUBIN DIRECT: 0.1 mg/dL (ref <=0.2)
BILIRUBIN TOTAL: 0.5 mg/dL (ref 0.2–1.2)
BUN: 16 mg/dL (ref 7–25)
CO2: 32 mmol/L — AB (ref 20–31)
CREATININE: 0.77 mg/dL (ref 0.60–0.93)
Calcium: 9.4 mg/dL (ref 8.6–10.4)
Chloride: 100 mmol/L (ref 98–110)
Glucose, Bld: 87 mg/dL (ref 65–99)
Indirect Bilirubin: 0.4 mg/dL (ref 0.2–1.2)
Potassium: 4.1 mmol/L (ref 3.5–5.3)
Sodium: 140 mmol/L (ref 135–146)
Total Protein: 6.6 g/dL (ref 6.1–8.1)

## 2017-01-19 LAB — FOLATE

## 2017-01-19 LAB — LIPID PANEL
CHOL/HDL RATIO: 2 ratio (ref <5.0)
CHOLESTEROL: 214 mg/dL — AB (ref <200)
HDL: 109 mg/dL (ref >50)
LDL Cholesterol: 89 mg/dL (ref <100)
Triglycerides: 81 mg/dL (ref <150)
VLDL: 16 mg/dL (ref <30)

## 2017-01-19 LAB — VITAMIN B12: Vitamin B-12: 232 pg/mL (ref 200–1100)

## 2017-01-19 LAB — IRON AND TIBC
%SAT: 25 % (ref 11–50)
Iron: 75 ug/dL (ref 45–160)
TIBC: 306 ug/dL (ref 250–450)
UIBC: 231 ug/dL (ref 125–400)

## 2017-01-19 LAB — FERRITIN: Ferritin: 210 ng/mL (ref 20–288)

## 2017-01-20 LAB — VITAMIN D 25 HYDROXY (VIT D DEFICIENCY, FRACTURES): Vit D, 25-Hydroxy: 65 ng/mL (ref 30–100)

## 2017-01-20 LAB — INSULIN, FASTING: INSULIN FASTING, SERUM: 2.4 u[IU]/mL (ref 2.0–19.6)

## 2017-01-20 LAB — C-PEPTIDE: C-Peptide: 1.23 ng/mL (ref 0.80–3.85)

## 2017-01-20 LAB — GLUCOSE, RANDOM: Glucose, Bld: 85 mg/dL (ref 65–99)

## 2017-01-22 ENCOUNTER — Ambulatory Visit (INDEPENDENT_AMBULATORY_CARE_PROVIDER_SITE_OTHER): Payer: Medicare Other

## 2017-01-22 ENCOUNTER — Ambulatory Visit (INDEPENDENT_AMBULATORY_CARE_PROVIDER_SITE_OTHER): Payer: Medicare Other | Admitting: Podiatry

## 2017-01-22 ENCOUNTER — Ambulatory Visit: Payer: Medicare Other

## 2017-01-22 DIAGNOSIS — M2041 Other hammer toe(s) (acquired), right foot: Secondary | ICD-10-CM

## 2017-01-22 DIAGNOSIS — M2011 Hallux valgus (acquired), right foot: Secondary | ICD-10-CM

## 2017-01-22 DIAGNOSIS — M21621 Bunionette of right foot: Secondary | ICD-10-CM

## 2017-01-22 DIAGNOSIS — M21611 Bunion of right foot: Secondary | ICD-10-CM | POA: Diagnosis not present

## 2017-01-22 DIAGNOSIS — M2042 Other hammer toe(s) (acquired), left foot: Secondary | ICD-10-CM

## 2017-01-22 DIAGNOSIS — M21612 Bunion of left foot: Secondary | ICD-10-CM

## 2017-01-22 DIAGNOSIS — M2012 Hallux valgus (acquired), left foot: Secondary | ICD-10-CM | POA: Diagnosis not present

## 2017-01-22 LAB — ZINC: ZINC: 74 ug/dL (ref 60–130)

## 2017-01-22 LAB — COPPER, SERUM: COPPER: 126 ug/dL (ref 70–175)

## 2017-01-22 LAB — PARATHYROID HORMONE, INTACT (NO CA): PTH: 62 pg/mL (ref 14–64)

## 2017-01-22 NOTE — Progress Notes (Signed)
   Subjective:   76 year old female presents to the office today for evaluation of right foot pain. Patient presents today to discuss surgical options regarding her right foot. She has symptomatic painful bunion deformity as well as multiple hammertoe deformities and tailor's bunion to the right foot.  Patient recently had surgery on her left foot in [place] in 2016.    Objective/Physical Exam General: The patient is alert and oriented x3 in no acute distress.  Dermatology: Skin is warm, dry and supple bilateral lower extremities. Negative for open lesions or macerations.  Vascular: Palpable pedal pulses bilaterally. No edema or erythema noted. Capillary refill within normal limits.  Neurological: Epicritic and protective threshold grossly intact bilaterally.   Musculoskeletal Exam: Hallux abductovalgus and bunion deformity noted on clinical exam. There is also a large prominent Taylor's bunion to the right foot with hammertoe contractures digits 2-5 right foot. Pain on palpation and range of motion also noted to the metatarsal phalangeal joints throughout the right foot.  Hammertoe contracture deformity of the fourth digit left foot also noted. Patient states that when she had surgery on her left foot in 2016 they did not address her hammertoe deformity to the fourth digit left foot.  Radiographic Exam:  Hallux abductovalgus with an intermetatarsal angle greater than 15 noted. Hallux abductus angle greater than 30. Increased intermetatarsal space noted to the fourth interspace right foot greater than 10 consistent with a tailor's bunion deformity. Diffuse DJD noted throughout the joints of the right foot with mild joint space narrowing especially at the 1st metatarsal phalangeal joint Painful limited range of motion to the first MPJ bilateral noted.  Assessment: #1 hallux abductovalgus with bunion deformity right foot #2 tailor's bunion deformity right foot #3 hammertoe  contracture deformities digits 2-5 right foot #4 hammertoe deformity fourth digit left foot   Plan of Care:  #1 Patient was evaluated. #2 today we discussed conservative versus surgical management of forefoot pathology to the right foot as well as the hammertoe to the fourth digit left foot. Patient opts for surgical correction. All details and possible complications were explained. All patient questions were answered. No guarantees were especially implied. #3 today authorization for surgery was initiated. Surgery will consist of: bunionectomy by double osteotomy right foot. Tailor's bunionectomy with 5th metatarsal osteotomy right foot. Hammertoe correction digits 2-5 right foot. Hammertoe correction fourth digit left foot.  MPJ capsulotomies 2-5 right foot.  MPJ capsulotomy fourth digit left foot.  #4 cam boot and postoperative shoe were dispensed today for postoperative care. #5 return to clinic 1 week postop   Edrick Kins, DPM [facility]  Dr. Edrick Kins, [address]                                        [address] [place] [address], [place] [address]                Office [phone]  Fax [phone]

## 2017-01-22 NOTE — Patient Instructions (Signed)

## 2017-01-25 ENCOUNTER — Ambulatory Visit (INDEPENDENT_AMBULATORY_CARE_PROVIDER_SITE_OTHER): Payer: Medicare Other | Admitting: Osteopathic Medicine

## 2017-01-25 ENCOUNTER — Encounter: Payer: Self-pay | Admitting: Osteopathic Medicine

## 2017-01-25 VITALS — BP 119/69 | HR 60 | Ht 64.0 in | Wt 144.0 lb

## 2017-01-25 DIAGNOSIS — Z23 Encounter for immunization: Secondary | ICD-10-CM

## 2017-01-25 DIAGNOSIS — R350 Frequency of micturition: Secondary | ICD-10-CM

## 2017-01-25 DIAGNOSIS — I350 Nonrheumatic aortic (valve) stenosis: Secondary | ICD-10-CM

## 2017-01-25 DIAGNOSIS — N390 Urinary tract infection, site not specified: Secondary | ICD-10-CM

## 2017-01-25 DIAGNOSIS — Z1231 Encounter for screening mammogram for malignant neoplasm of breast: Secondary | ICD-10-CM

## 2017-01-25 DIAGNOSIS — N3000 Acute cystitis without hematuria: Secondary | ICD-10-CM

## 2017-01-25 DIAGNOSIS — E161 Other hypoglycemia: Secondary | ICD-10-CM | POA: Diagnosis not present

## 2017-01-25 DIAGNOSIS — M67449 Ganglion, unspecified hand: Secondary | ICD-10-CM | POA: Diagnosis not present

## 2017-01-25 DIAGNOSIS — Z13820 Encounter for screening for osteoporosis: Secondary | ICD-10-CM

## 2017-01-25 DIAGNOSIS — Z1239 Encounter for other screening for malignant neoplasm of breast: Secondary | ICD-10-CM

## 2017-01-25 DIAGNOSIS — I4891 Unspecified atrial fibrillation: Secondary | ICD-10-CM

## 2017-01-25 LAB — POCT URINALYSIS DIPSTICK
BILIRUBIN UA: NEGATIVE
GLUCOSE UA: NEGATIVE
Ketones, UA: NEGATIVE
NITRITE UA: POSITIVE
PH UA: 6.5
Protein, UA: NEGATIVE
SPEC GRAV UA: 1.015
UROBILINOGEN UA: 1

## 2017-01-25 LAB — BETA-HYDROXYBUTYRIC ACID: BETA-HYDROXYBUTYRIC ACID: 0.06 mmol/L

## 2017-01-25 LAB — INSULIN ANTIBODIES, BLOOD

## 2017-01-25 LAB — VITAMIN B1: Vitamin B1 (Thiamine): 12 nmol/L (ref 8–30)

## 2017-01-25 MED ORDER — METOPROLOL TARTRATE 25 MG PO TABS: 25.0000 mg | ORAL_TABLET | Freq: Two times a day (BID) | ORAL | 3 refills | Status: DC

## 2017-01-25 MED ORDER — SULFAMETHOXAZOLE-TRIMETHOPRIM 800-160 MG PO TABS: 1.0000 | ORAL_TABLET | Freq: Two times a day (BID) | ORAL | 0 refills | Status: DC

## 2017-01-25 MED ORDER — ELIQUIS 5 MG PO TABS: 5.0000 mg | ORAL_TABLET | Freq: Two times a day (BID) | ORAL | 3 refills | Status: DC

## 2017-01-25 NOTE — Patient Instructions (Addendum)
Plan:  Endocrinology specialist to evaluate sugars dropping - please keep as good of records as you can of sugars and meals!  Fasting sugar  Breakfast food  Lunch food  Dinner food  Sugars before meals  Sugars 2 hours after meals  Sugars as needed for feeling like dropping   Copy for cyst removal - you should get a call   Will treat UTI and will call once culture results available   Will leave heart management up to cardiology team unless there is a problem  Return for annual physical in the next 3 months or so, sooner if needed  Should get a call about mammogram and bone density test  Anything gets worse or changes, please come see me sooner or seek emergency care!

## 2017-01-25 NOTE — Progress Notes (Addendum)
HPI: Donna [name] is a 76 y.o. female  who presents to [place] today, 01/25/17,  for chief complaint of:  Chief Complaint  Patient presents with  . Follow-up    blood sugar/ labs    Seen one week ago for sugars dropping after meals, 1-2 hours after eating. All labs negative thus far, still awaiting Proinsulin and Insulin Ab. Was asked to keep a diary of the symptoms and sugar readings as well as food intake - limited records kept, see scanned docs. Glc got as low as 55 on one occasion.   Concerned about possible UTI - some frequency/dysuria past week or so, forgot to mention it at last visit  Mucinous cyst thumb - would like to see surgeon about this.   Atrial fibrillation: Stable, patient would like refill on medications.   Patient is accompanied by husband who assists with history-taking.     Past medical history, surgical history, social history and family history reviewed.  Patient Active Problem List   Diagnosis Date Noted  . Mucinous cyst of left thumb 12/18/2016  . Chronic idiopathic constipation 12/08/2016  . Chronic diastolic congestive heart failure ([place]) 11/08/2016  . Benign paroxysmal positional vertigo 10/18/2016  . Lower extremity edema 10/18/2016  . Acute pyelonephritis, right 10/18/2016  . Vitamin D deficiency 10/18/2016  . History of anemia 10/18/2016  . Restless leg syndrome 10/17/2016  . CAD (coronary atherosclerotic disease) 10/17/2016  . Atrial fibrillation ([place]) 10/17/2016  . History of gastric bypass 10/17/2016  . Hyperlipidemia 10/17/2016  . Sleep disorder 10/17/2016  . Tobacco dependence 10/17/2016    Current medication list and allergy/intolerance information reviewed.   Current Outpatient Prescriptions on File Prior to Visit  Medication Sig Dispense Refill  . atorvastatin (LIPITOR) 10 MG tablet Take 1 tablet (10 mg total) by mouth daily. 90 tablet 3  . Cholecalciferol (VITAMIN D3) 5000 units CAPS  Take by mouth.    . co-enzyme Q-10 30 MG capsule Take 30 mg by mouth 3 (three) times daily.    . Ferrous Sulfate (IRON) 325 (65 Fe) MG TABS Take by mouth.    . folic acid (FOLVITE) 932 MCG tablet Take 800 mcg by mouth daily.    . furosemide (LASIX) 20 MG tablet Take 1 tablet (20 mg total) by mouth as directed. Take as directed 20 tablet 0  . loperamide (IMODIUM) 2 MG capsule Take one after AM bowel movement    . meclizine (ANTIVERT) 12.5 MG tablet Take 12.5 mg by mouth 2 (two) times daily.    . modafinil (PROVIGIL) 200 MG tablet Take 1 tablet (200 mg total) by mouth 2 (two) times daily. 60 tablet 5  . ondansetron (ZOFRAN-ODT) 8 MG disintegrating tablet Take 1 tablet (8 mg total) by mouth every 8 (eight) hours as needed for nausea. 20 tablet 3  . pramipexole (MIRAPEX) 0.25 MG tablet Take 1 tablet (0.25 mg total) by mouth 2 (two) times daily. 180 tablet 3   No current facility-administered medications on file prior to visit.    Allergies  Allergen Reactions  . Penicillins   . Vancomycin       Review of Systems:  Constitutional: No recent illness  HEENT: No  headache, no vision change  Cardiac: No  chest pain, No  pressure, No palpitations  Respiratory:  No  shortness of breath. No  Cough  Gastrointestinal: No  abdominal pain, no change on bowel habits  Musculoskeletal: No new myalgia/arthralgia  Skin: No  Rash  Hem/Onc:  No  easy bruising/bleeding, No  abnormal lumps/bumps  Neurologic: No  weakness, No  Dizziness  Psychiatric: No  concerns with depression, No  concerns with anxiety  Exam:  BP 119/69   Pulse 60   Ht 5\' 4"  (1.626 m)   Wt 144 lb (65.3 kg)   BMI 24.72 kg/m   Constitutional: VS see above. General Appearance: alert, well-developed, well-nourished, NAD  Eyes: Normal lids and conjunctive, non-icteric sclera  Ears, Nose, Mouth, Throat: MMM, Normal external inspection ears/nares/mouth/lips/gums.  Neck: No masses, trachea midline.   Respiratory: Normal  respiratory effort. no wheeze, no rhonchi, no rales  Cardiovascular: S1/S2 normal, +LSB 2/6 systolic murmur, no rub/gallop auscultated. RRR.   Musculoskeletal: Gait normal. Symmetric and independent movement of all extremities  Neurological: Normal balance/coordination. No tremor.  Skin: warm, dry, intact.   Psychiatric: Normal judgment/insight. Normal mood and affect. Oriented x3.      ASSESSMENT/PLAN:   Idiopathic postprandial hypoglycemia - Limited logs kept, labs normal, still waiting on proinulin and insulin Ab, endocrine may be able to give [place] some guidance here - Plan: Ambulatory referral to Endocrinology  Digital mucinous cyst - Plan: Ambulatory referral to Hand Surgery  Urinary frequency - Plan: Urine Culture, POCT Urinalysis Dipstick  Atrial fibrillation, unspecified type ([place]) - On chronic anticoagulation, eliquis and BB refilled today  - Plan: metoprolol tartrate (LOPRESSOR) 25 MG tablet, ELIQUIS 5 MG TABS tablet  Screening for osteoporosis - Plan: DG Bone Density  Screening for breast cancer - Plan: MM DIGITAL SCREENING BILATERAL  Need for tetanus booster - Plan: Tdap vaccine greater than or equal to 7yo IM  Acute cystitis without hematuria - If culture is positive, this will be third culture confirms UTI in the skin of the year and we should think about prophylactic antibiotics - Plan: sulfamethoxazole-trimethoprim (BACTRIM DS) 800-160 MG tablet  Aortic valve stenosis, etiology of cardiac valve disease unspecified  Recurrent UTI    Patient Instructions  Plan:  Endocrinology specialist to evaluate sugars dropping - please keep as good of records as you can of sugars and meals!  Fasting sugar  Breakfast food  Lunch food  Dinner food  Sugars before meals  Sugars 2 hours after meals  Sugars as needed for feeling like dropping   Copy for cyst removal - you should get a call   Will treat UTI and will call once culture results available    Will leave heart management up to cardiology team unless there is a problem  Return for annual physical in the next 3 months or so, sooner if needed  Should get a call about mammogram and bone density test  Anything gets worse or changes, please come see me sooner or seek emergency care!     Follow-up plan: Return in about 3 months (around 04/27/2017) for [place], sooner if needed.  Visit summary with medication list and pertinent instructions was printed for patient to review, alert [place] if any changes needed. All questions at time of visit were answered - patient instructed to contact office with any additional concerns. ER/RTC precautions were reviewed with the patient and understanding verbalized.   Note: Total time spent 40 minutes, greater than 50% of the visit was spent face-to-face counseling and coordinating care for the following: The primary encounter diagnosis was Idiopathic postprandial hypoglycemia. Diagnoses of Digital mucinous cyst, Urinary frequency, Atrial fibrillation, unspecified type ([place]), Screening for osteoporosis, Screening for breast cancer, Need for tetanus booster, Acute cystitis without hematuria, and Aortic valve stenosis, etiology  of cardiac valve disease unspecified were also pertinent to this visit.[name]

## 2017-01-29 DIAGNOSIS — N390 Urinary tract infection, site not specified: Secondary | ICD-10-CM | POA: Insufficient documentation

## 2017-01-29 LAB — URINE CULTURE

## 2017-01-29 MED ORDER — NITROFURANTOIN MACROCRYSTAL 100 MG PO CAPS: 100.0000 mg | ORAL_CAPSULE | Freq: Every day | ORAL | 1 refills | Status: DC

## 2017-01-29 NOTE — Addendum Note (Signed)
Addended by: Maryla Morrow on: 01/29/2017 11:38 AM   Modules accepted: Orders

## 2017-01-30 ENCOUNTER — Ambulatory Visit: Payer: Medicare Other

## 2017-01-30 ENCOUNTER — Ambulatory Visit: Payer: Medicare Other | Admitting: Osteopathic Medicine

## 2017-01-31 ENCOUNTER — Encounter: Payer: Self-pay | Admitting: Cardiology

## 2017-02-02 LAB — PROINSULIN: Proinsulin: <7.5 pmol/L (ref <=18.8)

## 2017-02-05 ENCOUNTER — Ambulatory Visit: Payer: Medicare Other | Admitting: Osteopathic Medicine

## 2017-02-05 ENCOUNTER — Ambulatory Visit: Payer: Medicare Other

## 2017-02-08 ENCOUNTER — Ambulatory Visit (INDEPENDENT_AMBULATORY_CARE_PROVIDER_SITE_OTHER): Payer: Medicare Other | Admitting: Cardiology

## 2017-02-08 ENCOUNTER — Encounter: Payer: Self-pay | Admitting: Cardiology

## 2017-02-08 VITALS — BP 130/84 | HR 60 | Ht 64.0 in | Wt 149.4 lb

## 2017-02-08 DIAGNOSIS — I480 Paroxysmal atrial fibrillation: Secondary | ICD-10-CM

## 2017-02-08 LAB — CUP PACEART INCLINIC DEVICE CHECK
Battery Remaining Longevity: 84 mo
Brady Statistic RA Percent Paced: 47 %
Implantable Lead Implant Date: 20150324
Implantable Lead Implant Date: 20150324
Implantable Lead Location: 753860
Implantable Lead Model: 4456
Implantable Lead Model: 4479
Implantable Lead Serial Number: 531114
Lead Channel Impedance Value: 457 Ohm
Lead Channel Pacing Threshold Amplitude: 0.7 V
Lead Channel Pacing Threshold Pulse Width: 0.4 ms
Lead Channel Pacing Threshold Pulse Width: 0.5 ms
Lead Channel Sensing Intrinsic Amplitude: 10.2 mV
Lead Channel Setting Pacing Amplitude: 2 V
Lead Channel Setting Pacing Amplitude: 2 V
Lead Channel Setting Pacing Pulse Width: 0.4 ms
Lead Channel Setting Sensing Sensitivity: 2.5 mV
MDC IDC LEAD LOCATION: 753859
MDC IDC LEAD SERIAL: 479154
MDC IDC MSMT LEADCHNL RA PACING THRESHOLD AMPLITUDE: 0.5 V
MDC IDC MSMT LEADCHNL RA SENSING INTR AMPL: 1.7 mV
MDC IDC MSMT LEADCHNL RV IMPEDANCE VALUE: 484 Ohm
MDC IDC PG IMPLANT DT: 20150324
MDC IDC PG SERIAL: 391090
MDC IDC SESS DTM: 20180322123855
MDC IDC STAT BRADY RV PERCENT PACED: 1 % — AB

## 2017-02-08 NOTE — Progress Notes (Signed)
Electrophysiology Office Note   Date:  02/08/2017   ID:  Donna, [name] 1941-10-11, MRN [id number]  PCP:  [name], DO  Primary Electrophysiologist:  Letha Mirabal Meredith Leeds, MD    Chief Complaint  Patient presents with  . Pacemaker Check    sinus node dysfunction     History of Present Illness: Donna [name] is a 76 y.o. female who presents today for electrophysiology evaluation.   She has a history of atrial fibrillation, and a pacemaker placed for her atrial fibrillation. She has had multiple hospitalizations and cardioversion for her atrial fibrillation. She presents today feeling well. She has had some lower extremity edema and has gained up to 7 pounds over the last 2 months. She did move from [place]. She has joined a gym and has been doing this as the pool as well as aerobics. She feels like these are going well. She otherwise does not have any major complaint.   Today, she denies symptoms of palpitations, chest pain, orthopnea, PND, lower extremity edema, claudication, dizziness, presyncope, syncope, bleeding, or neurologic sequela. The patient is tolerating medications without difficulties and is otherwise without complaint today.    Past Medical History:  Diagnosis Date  . Atrial fibrillation ([place]) 10/17/2016   On Metoprolol, Eliquis  . Benign paroxysmal positional vertigo 10/18/2016  . CAD (coronary atherosclerotic disease) 10/17/2016   Hx MI  . Cystitis 10/18/2016  . Dysuria 10/18/2016  . History of anemia 10/18/2016  . History of gastric bypass 10/17/2016  . Hyperlipidemia 10/17/2016  . Lower extremity edema 10/18/2016  . Restless leg syndrome 10/17/2016  . Sleep disorder 10/17/2016   Modafinil for sleep apnea and narcolepsy  . Tobacco dependence 10/17/2016  . Urinary frequency 10/18/2016  . Vitamin D deficiency 10/18/2016   Past Surgical History:  Procedure Laterality Date  . CARDIAC CATHETERIZATION       Current Outpatient  Prescriptions  Medication Sig Dispense Refill  . atorvastatin (LIPITOR) 10 MG tablet Take 1 tablet (10 mg total) by mouth daily. 90 tablet 3  . Cholecalciferol (VITAMIN D3) 5000 units CAPS Take by mouth.    . co-enzyme Q-10 30 MG capsule Take 30 mg by mouth 3 (three) times daily.    [name] ELIQUIS 5 MG TABS tablet Take 1 tablet (5 mg total) by mouth 2 (two) times daily. 180 tablet 3  . Ferrous Sulfate (IRON) 325 (65 Fe) MG TABS Take by mouth.    . folic acid (FOLVITE) 409 MCG tablet Take 800 mcg by mouth daily.    . furosemide (LASIX) 20 MG tablet Take 1 tablet (20 mg total) by mouth as directed. Take as directed 20 tablet 0  . loperamide (IMODIUM) 2 MG capsule Take one after AM bowel movement    . meclizine (ANTIVERT) 12.5 MG tablet Take 12.5 mg by mouth 2 (two) times daily.    . metoprolol tartrate (LOPRESSOR) 25 MG tablet Take 1 tablet (25 mg total) by mouth 2 (two) times daily. 180 tablet 3  . modafinil (PROVIGIL) 200 MG tablet Take 1 tablet (200 mg total) by mouth 2 (two) times daily. 60 tablet 5  . nitrofurantoin (MACRODANTIN) 100 MG capsule Take 1 capsule (100 mg total) by mouth at bedtime. To prevent UTI 90 capsule 1  . ondansetron (ZOFRAN-ODT) 8 MG disintegrating tablet Take 1 tablet (8 mg total) by mouth every 8 (eight) hours as needed for nausea. 20 tablet 3  . pramipexole (MIRAPEX) 0.25 MG tablet Take 1 tablet (0.25 mg total) by mouth 2 (  two) times daily. 180 tablet 3  . sulfamethoxazole-trimethoprim (BACTRIM DS) 800-160 MG tablet Take 1 tablet by mouth 2 (two) times daily. 14 tablet 0   No current facility-administered medications for this visit.     Allergies:   Penicillins and Vancomycin   Social History:  The patient  reports that she has quit smoking. She has a 55.00 pack-year smoking history. She has never used smokeless tobacco. She reports that she does not drink alcohol or use drugs.   Family History:  The patient's family history includes Bladder Cancer in her brother;  Diabetes in her father; Emphysema in her father and mother; Heart attack in her maternal grandmother; Heart disease in her father; Hypertension in her brother, brother, and sister; Kidney failure in her paternal grandmother; Stroke in her paternal grandfather.    ROS:  Please see the history of present illness.   Otherwise, review of systems is positive for weight change, appetite change, chills, fever, leg swelling, nausea, blood in urine, dizziness, balance problems.   All other systems are reviewed and negative.    PHYSICAL EXAM: VS:  BP 130/84   Pulse 60   Ht 5\' 4"  (1.626 m)   Wt 149 lb 6.4 oz (67.8 kg)   SpO2 98%   BMI 25.64 kg/m  , BMI Body mass index is 25.64 kg/m. GEN: Well nourished, well developed, in no acute distress  HEENT: normal  Neck: no JVD, carotid bruits, or masses Cardiac: RRR; no murmurs, rubs, or gallops,no edema  Respiratory:  clear to auscultation bilaterally, normal work of breathing GI: soft, nontender, nondistended, + BS MS: no deformity or atrophy  Skin: warm and dry,  device pocket is well healed Neuro:  Strength and sensation are intact Psych: euthymic mood, full affect  EKG:  EKG is not ordered today. Personal review of the ekg ordered 10/30/17 shows sinus rhythm, rate 63   Device interrogation is reviewed today in detail.  See PaceArt for details.   Recent Labs: 10/17/2016: Brain Natriuretic Peptide 238.4; TSH 2.53 01/18/2017: ALT 14; BUN 16; Creat 0.77; Hemoglobin 14.2; Platelets 234; Potassium 4.1; Sodium 140    Lipid Panel     Component Value Date/Time   CHOL 214 (H) 01/18/2017 0833   TRIG 81 01/18/2017 0833   HDL 109 01/18/2017 0833   CHOLHDL 2.0 01/18/2017 0833   VLDL 16 01/18/2017 0833   LDLCALC 89 01/18/2017 0833     Wt Readings from Last 3 Encounters:  02/08/17 149 lb 6.4 oz (67.8 kg)  01/25/17 144 lb (65.3 kg)  01/18/17 145 lb (65.8 kg)      Other studies Reviewed: Additional studies/ records that were reviewed today  include: TTE 10/26/16  Review of the above records today demonstrates:  - Left ventricle: The cavity size was normal. Wall thickness was   increased in a pattern of mild LVH. Systolic function was normal.   The estimated ejection fraction was in the range of 55% to 60%.   Wall motion was normal; there were no regional wall motion   abnormalities. Features are consistent with a pseudonormal left   ventricular filling pattern, with concomitant abnormal relaxation   and increased filling pressure (grade 2 diastolic dysfunction). - Aortic valve: Trileaflet; moderately calcified leaflets.   Sclerosis without stenosis. - Mitral valve: Moderately calcified annulus. Mildly calcified   leaflets . There was trivial regurgitation. - Left atrium: The atrium was moderately dilated. - Right ventricle: The cavity size was mildly dilated. Pacer wire   or catheter  noted in right ventricle. Systolic function was   normal. - Right atrium: The atrium was moderately dilated. - Tricuspid valve: Peak RV-RA gradient (S): 26 mm Hg. - Pulmonary arteries: PA peak pressure: 29 mm Hg (S). - Inferior vena cava: The vessel was normal in size. The   respirophasic diameter changes were in the normal range (>= 50%),   consistent with normal central venous pressure.   ASSESSMENT AND PLAN:  1.  Atrial fibrillation: Currently in sinus rhythm on the rhythm control medications. She is anticoagulated with apixaban. Has had short bursts of atrial tachycardia of 67 beats intermittently on her pacemaker. Otherwise no rhythm abnormalities. Continue current management.  2. Pacemaker: Stable lead parameters with a 7 year battery life. No changes made today.  3. Hypertension: Well controlled today.  4. Diastolic heart failure: She does have lower extremity edema, which is likely caused by her diastolic heart failure. He took Lasix which caused her to have muscle cramps. She would prefer not to have Lasix unless absolutely needed.  She Gaje Tennyson take them as needed.    Current medicines are reviewed at length with the patient today.   The patient does not have concerns regarding her medicines.  The following changes were made today:  none  Labs/ tests ordered today include:  No orders of the defined types were placed in this encounter.    Disposition:   FU with Destanie Tibbetts 6 months  Signed, Princella Jaskiewicz Meredith Leeds, MD  02/08/2017 12:25 PM     [place] [address] [phone] (office) [phone] (fax)

## 2017-02-08 NOTE — Patient Instructions (Addendum)
Medication Instructions:    Your physician recommends that you continue on your current medications as directed. Please refer to the Current Medication list given to you today.  --- If you need a refill on your cardiac medications before your next appointment, please call your pharmacy. ---  Labwork:  None ordered  Testing/Procedures:  None ordered  Follow-Up: Remote monitoring is used to monitor your Pacemaker of ICD from home. This monitoring reduces the number of office visits required to check your device to one time per year. It allows [place] to keep an eye on the functioning of your device to ensure it is working properly. You are scheduled for a device check from home on 05/10/2017. You may send your transmission at any time that day. If you have a wireless device, the transmission will be sent automatically. After your physician reviews your transmission, you will receive a postcard with your next transmission date.   Your physician wants you to follow-up in: 6 months with Dr. Curt Bears.  You will receive a reminder letter in the mail two months in advance. If you don't receive a letter, please call our office to schedule the follow-up appointment.   Any Other Special Instructions Will Be Listed Below (If Applicable).  You may stop your Eliquis 2 days prior to your foot surgery next week.  The foot surgeon will instruct you when to restart the medication after the procedure.   Thank you for choosing [facility]!!   Trinidad Curet, RN [phone]

## 2017-02-12 ENCOUNTER — Ambulatory Visit: Payer: Medicare Other

## 2017-02-12 ENCOUNTER — Encounter: Payer: Self-pay | Admitting: Osteopathic Medicine

## 2017-02-12 ENCOUNTER — Ambulatory Visit (INDEPENDENT_AMBULATORY_CARE_PROVIDER_SITE_OTHER): Payer: Medicare Other | Admitting: Osteopathic Medicine

## 2017-02-12 VITALS — BP 136/75 | HR 70 | Ht 64.0 in | Wt 145.0 lb

## 2017-02-12 DIAGNOSIS — E161 Other hypoglycemia: Secondary | ICD-10-CM

## 2017-02-12 NOTE — Progress Notes (Signed)
HPI: Donna [name] is a 76 y.o. female  who presents to [facility] today, 02/12/17,  for chief complaint of:  Chief Complaint  Patient presents with  . Follow-up    BLOOD SUGAR    Has appt w/ endocrine in the next few days. Hypoglycemic episodes seem to have resolved. Glc into 70s. Can't really get fasting levels, she seems to not be able to go 8+ hours without eating without getting tired, she wakes at night hungry and will snack.   Past medical history, surgical history, social history and family history reviewed.  Patient Active Problem List   Diagnosis Date Noted  . Recurrent UTI 01/29/2017  . Mucinous cyst of left thumb 12/18/2016  . Chronic idiopathic constipation 12/08/2016  . Chronic diastolic congestive heart failure ([place]) 11/08/2016  . Benign paroxysmal positional vertigo 10/18/2016  . Lower extremity edema 10/18/2016  . Acute pyelonephritis, right 10/18/2016  . Vitamin D deficiency 10/18/2016  . History of anemia 10/18/2016  . Restless leg syndrome 10/17/2016  . CAD (coronary atherosclerotic disease) 10/17/2016  . Atrial fibrillation ([place]) 10/17/2016  . History of gastric bypass 10/17/2016  . Hyperlipidemia 10/17/2016  . Sleep disorder 10/17/2016  . Tobacco dependence 10/17/2016    Current medication list and allergy/intolerance information reviewed.   Current Outpatient Prescriptions on File Prior to Visit  Medication Sig Dispense Refill  . atorvastatin (LIPITOR) 10 MG tablet Take 1 tablet (10 mg total) by mouth daily. 90 tablet 3  . Cholecalciferol (VITAMIN D3) 5000 units CAPS Take by mouth.    . co-enzyme Q-10 30 MG capsule Take 30 mg by mouth 3 (three) times daily.    [name] ELIQUIS 5 MG TABS tablet Take 1 tablet (5 mg total) by mouth 2 (two) times daily. 180 tablet 3  . Ferrous Sulfate (IRON) 325 (65 Fe) MG TABS Take by mouth.    . folic acid (FOLVITE) 846 MCG tablet Take 800 mcg by mouth daily.    . furosemide (LASIX) 20  MG tablet Take 1 tablet (20 mg total) by mouth as directed. Take as directed 20 tablet 0  . loperamide (IMODIUM) 2 MG capsule Take one after AM bowel movement    . meclizine (ANTIVERT) 12.5 MG tablet Take 12.5 mg by mouth 2 (two) times daily.    . metoprolol tartrate (LOPRESSOR) 25 MG tablet Take 1 tablet (25 mg total) by mouth 2 (two) times daily. 180 tablet 3  . modafinil (PROVIGIL) 200 MG tablet Take 1 tablet (200 mg total) by mouth 2 (two) times daily. 60 tablet 5  . nitrofurantoin (MACRODANTIN) 100 MG capsule Take 1 capsule (100 mg total) by mouth at bedtime. To prevent UTI 90 capsule 1  . ondansetron (ZOFRAN-ODT) 8 MG disintegrating tablet Take 1 tablet (8 mg total) by mouth every 8 (eight) hours as needed for nausea. 20 tablet 3  . pramipexole (MIRAPEX) 0.25 MG tablet Take 1 tablet (0.25 mg total) by mouth 2 (two) times daily. 180 tablet 3  . sulfamethoxazole-trimethoprim (BACTRIM DS) 800-160 MG tablet Take 1 tablet by mouth 2 (two) times daily. 14 tablet 0   No current facility-administered medications on file prior to visit.    Allergies  Allergen Reactions  . Penicillins   . Vancomycin       Review of Systems:  Constitutional: No recent illness, feeling well today   HEENT: No  headache, no vision change  Cardiac: No  chest pain  Respiratory:  No  shortness of breath  Gastrointestinal:  No  abdominal pain  Neurologic: No  weakness, No  Dizziness   Exam:  BP 136/75   Pulse 70   Ht 5\' 4"  (1.626 m)   Wt 145 lb (65.8 kg)   BMI 24.89 kg/m   Constitutional: VS see above. General Appearance: alert, well-developed, well-nourished, NAD  Neck: No masses, trachea midline.   Respiratory: Normal respiratory effort.   Musculoskeletal: Gait normal. Symmetric and independent movement of all extremities  Neurological: Normal balance/coordination.   Psychiatric: Normal judgment/insight. Normal mood and affect. Oriented x3.    ASSESSMENT/PLAN: Recommended followup with  Endocrine though of course reassuring that symptoms seem to have resolved, these may recur.   Idiopathic postprandial hypoglycemia    Follow-up plan: Return in about 6 months (around 08/15/2017) for [place] .  Visit summary with medication list and pertinent instructions was printed for patient to review, alert [place] if any changes needed. All questions at time of visit were answered - patient instructed to contact office with any additional concerns. ER/RTC precautions were reviewed with the patient and understanding verbalized.   Note: Total time spent 10 minutes, greater than 50% of the visit was spent face-to-face counseling and coordinating care for the following: The encounter diagnosis was Idiopathic postprandial hypoglycemia.[name]

## 2017-02-14 DIAGNOSIS — Z9884 Bariatric surgery status: Secondary | ICD-10-CM | POA: Diagnosis not present

## 2017-02-14 DIAGNOSIS — K912 Postsurgical malabsorption, not elsewhere classified: Secondary | ICD-10-CM | POA: Diagnosis not present

## 2017-02-15 ENCOUNTER — Encounter: Payer: Self-pay | Admitting: Podiatry

## 2017-02-15 ENCOUNTER — Other Ambulatory Visit: Payer: Self-pay | Admitting: Podiatry

## 2017-02-15 DIAGNOSIS — M2041 Other hammer toe(s) (acquired), right foot: Secondary | ICD-10-CM | POA: Diagnosis not present

## 2017-02-15 DIAGNOSIS — M2042 Other hammer toe(s) (acquired), left foot: Secondary | ICD-10-CM | POA: Diagnosis not present

## 2017-02-15 DIAGNOSIS — M2011 Hallux valgus (acquired), right foot: Secondary | ICD-10-CM | POA: Diagnosis not present

## 2017-02-15 DIAGNOSIS — M7750 Other enthesopathy of unspecified foot: Secondary | ICD-10-CM | POA: Diagnosis not present

## 2017-02-15 DIAGNOSIS — M21621 Bunionette of right foot: Secondary | ICD-10-CM | POA: Diagnosis not present

## 2017-02-15 DIAGNOSIS — M2021 Hallux rigidus, right foot: Secondary | ICD-10-CM | POA: Diagnosis not present

## 2017-02-15 DIAGNOSIS — Z9889 Other specified postprocedural states: Secondary | ICD-10-CM

## 2017-02-15 DIAGNOSIS — M25571 Pain in right ankle and joints of right foot: Secondary | ICD-10-CM | POA: Diagnosis not present

## 2017-02-15 DIAGNOSIS — M21611 Bunion of right foot: Secondary | ICD-10-CM | POA: Diagnosis not present

## 2017-02-15 DIAGNOSIS — E7800 Pure hypercholesterolemia, unspecified: Secondary | ICD-10-CM | POA: Diagnosis not present

## 2017-02-15 MED ORDER — OXYCODONE-ACETAMINOPHEN 5-325 MG PO TABS: 1.0000 | ORAL_TABLET | Freq: Four times a day (QID) | ORAL | 0 refills | Status: DC | PRN

## 2017-02-15 NOTE — Progress Notes (Signed)
Post surgical medication management.   Edrick Kins, DPM [facility]  Dr. Edrick Kins, [place]                                        [place], [place] [address]                Office [phone]  Fax [phone]

## 2017-02-16 ENCOUNTER — Telehealth: Payer: Self-pay | Admitting: *Deleted

## 2017-02-16 NOTE — Telephone Encounter (Signed)
Pt states she had surgery yesterday with Dr. Amalia Hailey and no one told her when to begin her Eliquis. Dr. Amalia Hailey states pt may begin the Eliquis today. I informed pt. I asked pt how she was and she said fine the left foot is awake, the right still numb and she is resting. I reminded pt, not to weight bear or dangle foot more than 15 minutes/hr and to remain in the boot at all times and to call with concerns. Pt states understanding.

## 2017-02-19 ENCOUNTER — Telehealth: Payer: Self-pay | Admitting: Emergency Medicine

## 2017-02-19 NOTE — Telephone Encounter (Signed)
Patient called to report an open sore on her buttocks that is draining now; she does not have a fever; taking macrodantin for uti; wears depends. Do you need to see her or can she just use triple antibiotic ointment?

## 2017-02-19 NOTE — Telephone Encounter (Signed)
Patient has been referred to wound care, needs to follow-up with them. Can we confirm that the patient has been scheduled for an appointment with wound care?

## 2017-02-21 ENCOUNTER — Ambulatory Visit (INDEPENDENT_AMBULATORY_CARE_PROVIDER_SITE_OTHER): Payer: Self-pay | Admitting: Podiatry

## 2017-02-21 ENCOUNTER — Ambulatory Visit (INDEPENDENT_AMBULATORY_CARE_PROVIDER_SITE_OTHER): Payer: Medicare Other

## 2017-02-21 ENCOUNTER — Other Ambulatory Visit: Payer: Self-pay

## 2017-02-21 ENCOUNTER — Encounter: Payer: Self-pay | Admitting: Podiatry

## 2017-02-21 DIAGNOSIS — M2041 Other hammer toe(s) (acquired), right foot: Secondary | ICD-10-CM

## 2017-02-21 DIAGNOSIS — Z9889 Other specified postprocedural states: Secondary | ICD-10-CM

## 2017-02-21 MED ORDER — OXYCODONE-ACETAMINOPHEN 5-325 MG PO TABS: 1.0000 | ORAL_TABLET | Freq: Four times a day (QID) | ORAL | 0 refills | Status: DC | PRN

## 2017-02-24 NOTE — Progress Notes (Signed)
   Subjective:   76 year old female presents to the office today for  POV #1 DOS 02/15/17. Patient underwent a right forefoot reconstruction and fourth left hammertoe repair.   Objective/Physical Exam General: The patient is alert and oriented x3 in no acute distress.  Dermatology: Skin is warm, dry and supple bilateral lower extremities. Negative for open lesions or macerations.  Vascular: Palpable pedal pulses bilaterally. No edema or erythema noted. Capillary refill within normal limits.  Neurological: Epicritic and protective threshold grossly intact bilaterally.   Musculoskeletal Exam: Skin incisions are well coapted with sutures and staples intact. Moderate edema noted with ecchymosis noted throughout the forefoot. Radiographic exam is consistent with intact orthopedic hardware.  Assessment: #1 edema with ecchymosis right foot #2 s/p forefoot reconstruction surgery-right #3 s/p hammertoe repair 4th left toe   Plan of Care:  #1 Patient was evaluated. #2 Dressing changed. #3 Return to clinic in 1 week.   Edrick Kins, DPM [facility]  Dr. Edrick Kins, [place] [place]                                        [place], [place] [address]                Office [phone]  Fax [phone]

## 2017-02-28 ENCOUNTER — Ambulatory Visit (INDEPENDENT_AMBULATORY_CARE_PROVIDER_SITE_OTHER): Payer: Self-pay | Admitting: Podiatry

## 2017-02-28 DIAGNOSIS — Z9889 Other specified postprocedural states: Secondary | ICD-10-CM

## 2017-02-28 MED ORDER — GENTAMICIN SULFATE 0.1 % EX CREA: 1.0000 "application " | TOPICAL_CREAM | Freq: Three times a day (TID) | CUTANEOUS | 1 refills | Status: DC

## 2017-02-28 MED ORDER — SULFAMETHOXAZOLE-TRIMETHOPRIM 800-160 MG PO TABS: 1.0000 | ORAL_TABLET | Freq: Two times a day (BID) | ORAL | 0 refills | Status: DC

## 2017-02-28 MED ORDER — OXYCODONE-ACETAMINOPHEN 5-325 MG PO TABS: 1.0000 | ORAL_TABLET | Freq: Four times a day (QID) | ORAL | 0 refills | Status: DC | PRN

## 2017-03-01 ENCOUNTER — Telehealth: Payer: Self-pay | Admitting: *Deleted

## 2017-03-01 ENCOUNTER — Encounter: Payer: Self-pay | Admitting: Osteopathic Medicine

## 2017-03-01 DIAGNOSIS — Z950 Presence of cardiac pacemaker: Secondary | ICD-10-CM | POA: Insufficient documentation

## 2017-03-01 DIAGNOSIS — R7303 Prediabetes: Secondary | ICD-10-CM | POA: Insufficient documentation

## 2017-03-01 DIAGNOSIS — I6782 Cerebral ischemia: Secondary | ICD-10-CM | POA: Insufficient documentation

## 2017-03-01 NOTE — Telephone Encounter (Addendum)
-----   Message from Edrick Kins, DPM sent at 02/28/2017  4:44 PM EDT ----- Regarding: Home health dressing changes Please provide home health dressing changes 3x/week x 4 weeks. Dx: s/p forefoot reconstructive surgery right foot. s/p hammertoe repair 4th left foot.  Patient is homebound.   - cleanse with normal saline. Dry.  - Applied topical gentamicin cream to all incision sites and percutaneous pins. - Dressed with nonadherent gauze, 4 x 4 gauze, [place], and lightly wrapped Ace wrap  Thanks, Dr. Amalia Hailey. 03/01/2017-Faxed required form, copy of Dr.Evans' 02/28/2017 4:44pm orders, clinicals and demographics to [place]. [place] asked for next pt's dressing change. I informed Baxter Flattery pt should have dressing changed tomorrow and she states it will be taken care of.04/016/2018-Amanda - Nanine Means states pt has been in stated to [facility] and will receive wound care 3x week for 4 weeks, the foot looks so much better.03/19/2017-Sarah, RN - [place] states pt's dressings are without drainage, skin is intact, no signs of infection, would like to know if it is okay to discharge pt and what is pt to cover foot with while in surgical shoe. 03/20/2017-Faxed Dr. Amalia Hailey orders 03/19/2017 6:15pm to [facility].

## 2017-03-02 DIAGNOSIS — Z7901 Long term (current) use of anticoagulants: Secondary | ICD-10-CM | POA: Diagnosis not present

## 2017-03-02 DIAGNOSIS — Z4789 Encounter for other orthopedic aftercare: Secondary | ICD-10-CM | POA: Diagnosis not present

## 2017-03-02 DIAGNOSIS — Z950 Presence of cardiac pacemaker: Secondary | ICD-10-CM | POA: Diagnosis not present

## 2017-03-02 DIAGNOSIS — I2510 Atherosclerotic heart disease of native coronary artery without angina pectoris: Secondary | ICD-10-CM | POA: Diagnosis not present

## 2017-03-02 DIAGNOSIS — I480 Paroxysmal atrial fibrillation: Secondary | ICD-10-CM | POA: Diagnosis not present

## 2017-03-02 DIAGNOSIS — Z792 Long term (current) use of antibiotics: Secondary | ICD-10-CM | POA: Diagnosis not present

## 2017-03-02 DIAGNOSIS — Z87891 Personal history of nicotine dependence: Secondary | ICD-10-CM | POA: Diagnosis not present

## 2017-03-02 DIAGNOSIS — Z87440 Personal history of urinary (tract) infections: Secondary | ICD-10-CM | POA: Diagnosis not present

## 2017-03-02 DIAGNOSIS — I5032 Chronic diastolic (congestive) heart failure: Secondary | ICD-10-CM | POA: Diagnosis not present

## 2017-03-02 DIAGNOSIS — K5904 Chronic idiopathic constipation: Secondary | ICD-10-CM | POA: Diagnosis not present

## 2017-03-02 NOTE — Progress Notes (Signed)
   Subjective:   76 year old female presents to the office today for  POV #2 DOS 02/15/17. Patient has had a right forefoot reconstruction and fourth left hammertoe repair. She reports some continued soreness and swelling.   Objective/Physical Exam General: The patient is alert and oriented x3 in no acute distress. Skin incision is well coapted with sutures and staples intact. There is some moderate edema with cellulitis noted to the foot.  Assessment: #1 s/p right forefoot reconstruction surgery #2 mild postoperative cellulitis right foot   Plan of Care:  #1 Patient was evaluated. #2 Dressing changed. #3 prescription for gentamicin cream and Bactrim DS #4 orders for home health dressing changes #5 return to clinic in 1 week for suture/staple removal   Edrick Kins, DPM [facility]  Dr. Edrick Kins, [address] [place]                                        [place], [place] [address]                Office [phone]  Fax [phone]

## 2017-03-06 ENCOUNTER — Encounter: Payer: Self-pay | Admitting: Osteopathic Medicine

## 2017-03-06 ENCOUNTER — Other Ambulatory Visit: Payer: Medicare Other

## 2017-03-06 ENCOUNTER — Ambulatory Visit: Payer: Medicare Other

## 2017-03-06 DIAGNOSIS — K5904 Chronic idiopathic constipation: Secondary | ICD-10-CM | POA: Diagnosis not present

## 2017-03-06 DIAGNOSIS — I480 Paroxysmal atrial fibrillation: Secondary | ICD-10-CM | POA: Diagnosis not present

## 2017-03-06 DIAGNOSIS — Z4789 Encounter for other orthopedic aftercare: Secondary | ICD-10-CM | POA: Diagnosis not present

## 2017-03-06 DIAGNOSIS — Z87440 Personal history of urinary (tract) infections: Secondary | ICD-10-CM | POA: Diagnosis not present

## 2017-03-06 DIAGNOSIS — I5032 Chronic diastolic (congestive) heart failure: Secondary | ICD-10-CM | POA: Diagnosis not present

## 2017-03-06 DIAGNOSIS — I2510 Atherosclerotic heart disease of native coronary artery without angina pectoris: Secondary | ICD-10-CM | POA: Diagnosis not present

## 2017-03-07 ENCOUNTER — Encounter: Payer: Self-pay | Admitting: Podiatry

## 2017-03-07 ENCOUNTER — Ambulatory Visit (INDEPENDENT_AMBULATORY_CARE_PROVIDER_SITE_OTHER): Payer: Medicare Other | Admitting: Podiatry

## 2017-03-07 DIAGNOSIS — Z9889 Other specified postprocedural states: Secondary | ICD-10-CM

## 2017-03-08 DIAGNOSIS — I480 Paroxysmal atrial fibrillation: Secondary | ICD-10-CM | POA: Diagnosis not present

## 2017-03-08 DIAGNOSIS — I5032 Chronic diastolic (congestive) heart failure: Secondary | ICD-10-CM | POA: Diagnosis not present

## 2017-03-08 DIAGNOSIS — Z87440 Personal history of urinary (tract) infections: Secondary | ICD-10-CM | POA: Diagnosis not present

## 2017-03-08 DIAGNOSIS — K5904 Chronic idiopathic constipation: Secondary | ICD-10-CM | POA: Diagnosis not present

## 2017-03-08 DIAGNOSIS — Z4789 Encounter for other orthopedic aftercare: Secondary | ICD-10-CM | POA: Diagnosis not present

## 2017-03-08 DIAGNOSIS — I2510 Atherosclerotic heart disease of native coronary artery without angina pectoris: Secondary | ICD-10-CM | POA: Diagnosis not present

## 2017-03-08 NOTE — Progress Notes (Signed)
   Subjective:   76 year old female presents to the office today for  POV #3 DOS 02/15/17. Patient has had a right forefoot reconstruction and fourth left hammertoe repair. She reports she is doing well and denies any other complaints at this time.   Objective/Physical Exam General: The patient is alert and oriented x3 in no acute distress. Skin incision is well coapted with sutures and staples intact. There is some moderate edema with cellulitis noted to the foot.  Assessment: #1 s/p right forefoot reconstruction surgery #2 mild postoperative cellulitis right foot   Plan of Care:  #1 Patient was evaluated. #2 Dressing changed. #3 staples removed #4 return to clinic in 1 week for pin removal   Edrick Kins, DPM [facility]  Dr. Edrick Kins, [address]                                        [place], [place] [address]                Office [phone]  Fax [phone]

## 2017-03-09 DIAGNOSIS — Z4789 Encounter for other orthopedic aftercare: Secondary | ICD-10-CM | POA: Diagnosis not present

## 2017-03-09 DIAGNOSIS — Z87440 Personal history of urinary (tract) infections: Secondary | ICD-10-CM | POA: Diagnosis not present

## 2017-03-09 DIAGNOSIS — K5904 Chronic idiopathic constipation: Secondary | ICD-10-CM | POA: Diagnosis not present

## 2017-03-09 DIAGNOSIS — I5032 Chronic diastolic (congestive) heart failure: Secondary | ICD-10-CM | POA: Diagnosis not present

## 2017-03-09 DIAGNOSIS — I2510 Atherosclerotic heart disease of native coronary artery without angina pectoris: Secondary | ICD-10-CM | POA: Diagnosis not present

## 2017-03-09 DIAGNOSIS — I480 Paroxysmal atrial fibrillation: Secondary | ICD-10-CM | POA: Diagnosis not present

## 2017-03-12 DIAGNOSIS — Z4789 Encounter for other orthopedic aftercare: Secondary | ICD-10-CM | POA: Diagnosis not present

## 2017-03-12 DIAGNOSIS — I480 Paroxysmal atrial fibrillation: Secondary | ICD-10-CM | POA: Diagnosis not present

## 2017-03-12 DIAGNOSIS — Z87440 Personal history of urinary (tract) infections: Secondary | ICD-10-CM | POA: Diagnosis not present

## 2017-03-12 DIAGNOSIS — K5904 Chronic idiopathic constipation: Secondary | ICD-10-CM | POA: Diagnosis not present

## 2017-03-12 DIAGNOSIS — I5032 Chronic diastolic (congestive) heart failure: Secondary | ICD-10-CM | POA: Diagnosis not present

## 2017-03-12 DIAGNOSIS — I2510 Atherosclerotic heart disease of native coronary artery without angina pectoris: Secondary | ICD-10-CM | POA: Diagnosis not present

## 2017-03-14 ENCOUNTER — Ambulatory Visit (INDEPENDENT_AMBULATORY_CARE_PROVIDER_SITE_OTHER): Payer: Medicare Other

## 2017-03-14 ENCOUNTER — Ambulatory Visit (INDEPENDENT_AMBULATORY_CARE_PROVIDER_SITE_OTHER): Payer: Medicare Other | Admitting: Podiatry

## 2017-03-14 DIAGNOSIS — Z9889 Other specified postprocedural states: Secondary | ICD-10-CM

## 2017-03-14 DIAGNOSIS — M2022 Hallux rigidus, left foot: Secondary | ICD-10-CM

## 2017-03-14 DIAGNOSIS — M2021 Hallux rigidus, right foot: Secondary | ICD-10-CM | POA: Diagnosis not present

## 2017-03-14 MED ORDER — CLOBETASOL PROPIONATE 0.05 % EX OINT: 1.0000 "application " | TOPICAL_OINTMENT | Freq: Two times a day (BID) | CUTANEOUS | 0 refills | Status: DC

## 2017-03-15 DIAGNOSIS — Z87440 Personal history of urinary (tract) infections: Secondary | ICD-10-CM | POA: Diagnosis not present

## 2017-03-15 DIAGNOSIS — K5904 Chronic idiopathic constipation: Secondary | ICD-10-CM | POA: Diagnosis not present

## 2017-03-15 DIAGNOSIS — I2510 Atherosclerotic heart disease of native coronary artery without angina pectoris: Secondary | ICD-10-CM | POA: Diagnosis not present

## 2017-03-15 DIAGNOSIS — Z4789 Encounter for other orthopedic aftercare: Secondary | ICD-10-CM | POA: Diagnosis not present

## 2017-03-15 DIAGNOSIS — I5032 Chronic diastolic (congestive) heart failure: Secondary | ICD-10-CM | POA: Diagnosis not present

## 2017-03-15 DIAGNOSIS — I480 Paroxysmal atrial fibrillation: Secondary | ICD-10-CM | POA: Diagnosis not present

## 2017-03-16 DIAGNOSIS — Z87440 Personal history of urinary (tract) infections: Secondary | ICD-10-CM | POA: Diagnosis not present

## 2017-03-16 DIAGNOSIS — K5904 Chronic idiopathic constipation: Secondary | ICD-10-CM | POA: Diagnosis not present

## 2017-03-16 DIAGNOSIS — I2510 Atherosclerotic heart disease of native coronary artery without angina pectoris: Secondary | ICD-10-CM | POA: Diagnosis not present

## 2017-03-16 DIAGNOSIS — I480 Paroxysmal atrial fibrillation: Secondary | ICD-10-CM | POA: Diagnosis not present

## 2017-03-16 DIAGNOSIS — I5032 Chronic diastolic (congestive) heart failure: Secondary | ICD-10-CM | POA: Diagnosis not present

## 2017-03-16 DIAGNOSIS — Z4789 Encounter for other orthopedic aftercare: Secondary | ICD-10-CM | POA: Diagnosis not present

## 2017-03-16 NOTE — Progress Notes (Signed)
   Subjective:   76 year old female presents to the office today for POV #4 DOS 02/15/17. Patient has had a right forefoot reconstruction and fourth left hammertoe repair. She reports she is still feeling pressure and thinks it may be from the pins.   Objective/Physical Exam General: The patient is alert and oriented x3 in no acute distress. Skin incision is well coapted.   Assessment: #1 s/p right forefoot reconstruction surgery #2 right lateral leg dermatitis   Plan of Care:  #1 Patient was evaluated. #2 Dressing changed. #3 percutaneous pins removed #4 prescription for Temovate cream #5 return to clinic in 2 weeks   Edrick Kins, DPM [facility]  Dr. Edrick Kins, [address] [place]                                        [place], [place] [address]                Office [phone]  Fax [phone]

## 2017-03-19 DIAGNOSIS — K5904 Chronic idiopathic constipation: Secondary | ICD-10-CM | POA: Diagnosis not present

## 2017-03-19 DIAGNOSIS — Z4789 Encounter for other orthopedic aftercare: Secondary | ICD-10-CM | POA: Diagnosis not present

## 2017-03-19 DIAGNOSIS — I480 Paroxysmal atrial fibrillation: Secondary | ICD-10-CM | POA: Diagnosis not present

## 2017-03-19 DIAGNOSIS — I5032 Chronic diastolic (congestive) heart failure: Secondary | ICD-10-CM | POA: Diagnosis not present

## 2017-03-19 DIAGNOSIS — I2510 Atherosclerotic heart disease of native coronary artery without angina pectoris: Secondary | ICD-10-CM | POA: Diagnosis not present

## 2017-03-19 DIAGNOSIS — Z87440 Personal history of urinary (tract) infections: Secondary | ICD-10-CM | POA: Diagnosis not present

## 2017-03-19 NOTE — Telephone Encounter (Signed)
Bandaids to open percutaneous pin holes with clean dry sock.  Thanks, Dr. Amalia Hailey

## 2017-03-20 ENCOUNTER — Encounter: Payer: Self-pay | Admitting: Osteopathic Medicine

## 2017-03-20 ENCOUNTER — Ambulatory Visit (INDEPENDENT_AMBULATORY_CARE_PROVIDER_SITE_OTHER): Payer: Medicare Other

## 2017-03-20 DIAGNOSIS — M810 Age-related osteoporosis without current pathological fracture: Secondary | ICD-10-CM | POA: Diagnosis not present

## 2017-03-20 DIAGNOSIS — Z1231 Encounter for screening mammogram for malignant neoplasm of breast: Secondary | ICD-10-CM | POA: Diagnosis not present

## 2017-03-21 ENCOUNTER — Encounter: Payer: Self-pay | Admitting: Osteopathic Medicine

## 2017-03-21 ENCOUNTER — Telehealth: Payer: Self-pay | Admitting: Osteopathic Medicine

## 2017-03-21 ENCOUNTER — Ambulatory Visit (INDEPENDENT_AMBULATORY_CARE_PROVIDER_SITE_OTHER): Payer: Medicare Other | Admitting: Osteopathic Medicine

## 2017-03-21 VITALS — BP 113/71 | HR 60 | Ht 64.0 in | Wt 144.0 lb

## 2017-03-21 DIAGNOSIS — M810 Age-related osteoporosis without current pathological fracture: Secondary | ICD-10-CM

## 2017-03-21 NOTE — Progress Notes (Signed)
HPI: Donna [name] is a 76 y.o. female  who presents to [facility] today, 03/21/17,  for chief complaint of:  Chief Complaint  Patient presents with  . Follow-up   Here to discuss results of recent bone density testing. Results indicate severe osteoporosis with T score less than -3.5. Patient is status post gastric bypass therefore not a candidate for oral bisphosphonate therapy. Discussed getting Prolia injections. Patient is concerned about adding new medications mainly due to cost. She is getting adequate vitamin D intake, not sure about calcium but sounds like good dietary sources through dairy.    Past medical history, surgical history, social history and family history reviewed.  Patient Active Problem List   Diagnosis Date Noted  . Osteoporosis 03/20/2017  . Ischemic changes on head CT 03/01/2017  . Pacemaker 03/01/2017  . Prediabetes 03/01/2017  . Recurrent UTI 01/29/2017  . Mucinous cyst of left thumb 12/18/2016  . Chronic idiopathic constipation 12/08/2016  . Chronic diastolic congestive heart failure ([place]) 11/08/2016  . Benign paroxysmal positional vertigo 10/18/2016  . Lower extremity edema 10/18/2016  . Acute pyelonephritis, right 10/18/2016  . Vitamin D deficiency 10/18/2016  . History of anemia 10/18/2016  . Restless leg syndrome 10/17/2016  . CAD (coronary atherosclerotic disease) 10/17/2016  . Paroxysmal atrial fibrillation ([place]) 10/17/2016  . History of gastric bypass 10/17/2016  . Hyperlipidemia 10/17/2016  . Sleep disorder 10/17/2016  . Tobacco dependence 10/17/2016    Current medication list and allergy/intolerance information reviewed.   Current Outpatient Prescriptions on File Prior to Visit  Medication Sig Dispense Refill  . atorvastatin (LIPITOR) 10 MG tablet Take 1 tablet (10 mg total) by mouth daily. 90 tablet 3  . Cholecalciferol (VITAMIN D3) 5000 units CAPS Take by mouth.    . clobetasol ointment  (TEMOVATE) 9.14 % Apply 1 application topically 2 (two) times daily. 30 g 0  . co-enzyme Q-10 30 MG capsule Take 30 mg by mouth 3 (three) times daily.    [name] ELIQUIS 5 MG TABS tablet Take 1 tablet (5 mg total) by mouth 2 (two) times daily. 180 tablet 3  . Ferrous Sulfate (IRON) 325 (65 Fe) MG TABS Take by mouth.    . folic acid (FOLVITE) 782 MCG tablet Take 800 mcg by mouth daily.    . furosemide (LASIX) 20 MG tablet Take 1 tablet (20 mg total) by mouth as directed. Take as directed 20 tablet 0  . gentamicin cream (GARAMYCIN) 0.1 % Apply 1 application topically 3 (three) times daily. 30 g 1  . loperamide (IMODIUM) 2 MG capsule Take one after AM bowel movement    . meclizine (ANTIVERT) 12.5 MG tablet Take 12.5 mg by mouth 2 (two) times daily.    . metoprolol tartrate (LOPRESSOR) 25 MG tablet Take 1 tablet (25 mg total) by mouth 2 (two) times daily. 180 tablet 3  . modafinil (PROVIGIL) 200 MG tablet Take 1 tablet (200 mg total) by mouth 2 (two) times daily. 60 tablet 5  . nitrofurantoin (MACRODANTIN) 100 MG capsule Take 1 capsule (100 mg total) by mouth at bedtime. To prevent UTI 90 capsule 1  . ondansetron (ZOFRAN-ODT) 8 MG disintegrating tablet Take 1 tablet (8 mg total) by mouth every 8 (eight) hours as needed for nausea. 20 tablet 3  . oxyCODONE-acetaminophen (ROXICET) 5-325 MG tablet Take 1 tablet by mouth every 6 (six) hours as needed for severe pain. 30 tablet 0  . pramipexole (MIRAPEX) 0.25 MG tablet Take 1 tablet (0.25 mg  total) by mouth 2 (two) times daily. 180 tablet 3  . sulfamethoxazole-trimethoprim (BACTRIM DS,SEPTRA DS) 800-160 MG tablet Take 1 tablet by mouth 2 (two) times daily. 28 tablet 0   No current facility-administered medications on file prior to visit.    Allergies  Allergen Reactions  . Penicillins   . Vancomycin       Review of Systems:  Constitutional: No recent illness, feels well today  Musculoskeletal: No new myalgia/arthralgia  Skin: +nonhealing wound on  sacrum  Exam:  BP 113/71   Pulse 60   Ht 5\' 4"  (1.626 m)   Wt 144 lb (65.3 kg)   BMI 24.72 kg/m   Constitutional: VS see above. General Appearance: alert, well-developed, well-nourished, NAD  Psychiatric: Normal judgment/insight. Normal mood and affect. Oriented x3.      Dg Bone Density  Result Date: 03/20/2017 EXAM: DUAL X-RAY ABSORPTIOMETRY (DXA) FOR BONE MINERAL DENSITY IMPRESSION: Referring Physician:  [name] PATIENT: Name: Donna [name], Donna [name] Patient ID: [id number] Birth Date: 1941/03/02 Height: 64.0 in. Sex: Female Measured: 03/20/2017 Weight: 143.8 lbs. Indications: Advanced Age, Caucasian, Estrogen Deficiency, Height Loss, Hysterectomy, Oophorectomy (Bilateral), Postmenopausal, Previous Smoker Fractures: Ankle Treatments: Vitamin D ASSESSMENT: The BMD measured at Forearm Radius 33% is 0.540 g/cm2 with a T-score of -3.8. This patient is considered osteoporotic according to [facility]) criteria. Lumbar spine was not utilized due to advanced degenerative changes. Site Region Measured Date Measured Age WHO YA BMD Classification T-score DualFemur Total Left 5/20[age] Osteoporosis -3.2 0.599 g/cm2 Left Forearm Radius 33% 5/20/2017 [age] Osteoporosis -3.8 0.540 g/cm2 World Health Organization [facility]) criteria for post-menopausal, Caucasian Women: Normal       T-score at or above -1 SD Osteopenia   T-score between -1 and -2.5 SD Osteoporosis T-score at or below -2.5 SD RECOMMENDATION: [place] recommends that FDA-approved medical therapies be considered in postmenopausal women and men age 56 or older with a: 1. Hip or vertebral (clinical or morphometric) fracture. 2. T-score of <-2.5 at the spine or hip. 3. Ten-year fracture probability by FRAX of 3% or greater for hip fracture or 20% or greater for major osteoporotic fracture. All treatments decisions require clinical judgment and consideration of individual patient factors, including  patient preferences, co-morbidities, previous drug use, risk factors not captured in the FRAX model (e.g. falls, vitamin D deficiency, increased bone turnover, interval significant decline in bone density) and possible under - or over-estimation of fracture risk by FRAX. All patients should ensure an adequate intake of dietary calcium (1200 mg/d) and vitamin D (800 IU daily) unless contraindicated. FOLLOW-UP: People with diagnosed cases of osteoporosis or at high risk for fracture should have regular bone mineral density tests. For patients eligible for Medicare, routine testing is allowed once every 2 years. The testing frequency can be increased to one year for patients who have rapidly progressing disease, those who are receiving or discontinuing medical therapy to restore bone mass, or have additional risk factors. I have reviewed this report and agree with the above findings. [facility] Electronically Signed   By: Rolm Baptise M.D.   On: 03/20/2017 11:05     ASSESSMENT/PLAN:   We'll see how much Prolia will cost. Patient educated on relative efficacy of medications for increasing bone density and fracture prevention. Encouraged consideration of treatment. Consider repeat bone density test in one year given severity. Fall precautions reviewed.  Osteoporosis without current pathological fracture, unspecified osteoporosis type    Patient Instructions  [place]: (  336) [id number]    Follow-up plan: Return in about 6 months (around 09/21/2017) for [place], sooner if needed .  Visit summary with medication list and pertinent instructions was printed for patient to review, alert [place] if any changes needed. All questions at time of visit were answered - patient instructed to contact office with any additional concerns. ER/RTC precautions were reviewed with the patient and understanding verbalized.   Note: Total time spent 15 minutes, greater than 50% of the  visit was spent face-to-face counseling and coordinating care for the following: The encounter diagnosis was Osteoporosis without current pathological fracture, unspecified osteoporosis type.[name]

## 2017-03-21 NOTE — Telephone Encounter (Signed)
Insurance and supplement cover Prolia. Rx in office already. Lab order for Ca+ levels ordered. Pt advised. Once labs are complete we will schedule for injection.

## 2017-03-21 NOTE — Patient Instructions (Signed)
[facility]: [phone]

## 2017-03-21 NOTE — Telephone Encounter (Signed)
-----   Message from [name], DO sent at 03/21/2017  9:45 AM EDT ----- Regarding: prolia Can we check up on how much would Prolia be for this paitent? If Rx needed, just let me know

## 2017-03-28 ENCOUNTER — Ambulatory Visit (INDEPENDENT_AMBULATORY_CARE_PROVIDER_SITE_OTHER): Payer: Medicare Other | Admitting: Podiatry

## 2017-03-28 DIAGNOSIS — M2041 Other hammer toe(s) (acquired), right foot: Secondary | ICD-10-CM

## 2017-03-28 DIAGNOSIS — Z9889 Other specified postprocedural states: Secondary | ICD-10-CM

## 2017-03-30 NOTE — Progress Notes (Signed)
   Subjective:   76 year old female presents to the office today for POV #5 DOS 02/15/17. Patient has had a right forefoot reconstruction and fourth left hammertoe repair. She states the right foot is still swollen.   Objective/Physical Exam General: The patient is alert and oriented x3 in no acute distress. Skin incision is well coapted.   Assessment: #1 s/p right forefoot reconstruction surgery-doing well #2 right lateral leg dermatitis   Plan of Care:  #1 Patient was evaluated. #2 Transition into good shoes #3 Compression anklet dispensed #4 Return to clinic in 3 months  Edrick Kins, DPM [facility]  Dr. Edrick Kins, [address]                                        [place], [place] [address]                Office [phone]  Fax [phone]

## 2017-04-04 DIAGNOSIS — M810 Age-related osteoporosis without current pathological fracture: Secondary | ICD-10-CM | POA: Diagnosis not present

## 2017-04-05 LAB — COMPLETE METABOLIC PANEL WITH GFR
ALT: 15 U/L (ref 6–29)
AST: 16 U/L (ref 10–35)
Albumin: 3.6 g/dL (ref 3.6–5.1)
Alkaline Phosphatase: 71 U/L (ref 33–130)
BILIRUBIN TOTAL: 0.3 mg/dL (ref 0.2–1.2)
BUN: 11 mg/dL (ref 7–25)
CO2: 29 mmol/L (ref 20–31)
CREATININE: 0.67 mg/dL (ref 0.60–0.93)
Calcium: 8.9 mg/dL (ref 8.6–10.4)
Chloride: 103 mmol/L (ref 98–110)
GFR, Est Non African American: 87 mL/min (ref >=60)
GLUCOSE: 86 mg/dL (ref 65–99)
Potassium: 4 mmol/L (ref 3.5–5.3)
SODIUM: 141 mmol/L (ref 135–146)
Total Protein: 6.2 g/dL (ref 6.1–8.1)

## 2017-04-06 ENCOUNTER — Ambulatory Visit (INDEPENDENT_AMBULATORY_CARE_PROVIDER_SITE_OTHER): Payer: Medicare Other | Admitting: Physician Assistant

## 2017-04-06 VITALS — BP 126/62 | HR 64

## 2017-04-06 DIAGNOSIS — M810 Age-related osteoporosis without current pathological fracture: Secondary | ICD-10-CM | POA: Diagnosis not present

## 2017-04-06 MED ORDER — DENOSUMAB 60 MG/ML ~~LOC~~ SOLN
60.0000 mg | Freq: Once | SUBCUTANEOUS | Status: AC
  Administered 2017-04-06: 60 mg via SUBCUTANEOUS

## 2017-04-06 NOTE — Progress Notes (Signed)
Pt came into clinic today for first Prolia injection. Pt had appropriate labs completed prior to injection, within normal range. Pt tolerated injection in left side of abdomen well, no immediate complications. Advised to contact clinic in 6 months so we can order labs and next Prolia injection. Pt verbalized understanding. No further questions at this time.   Agree with above plan. [place] Planas PA-C

## 2017-04-17 ENCOUNTER — Encounter (HOSPITAL_COMMUNITY): Payer: Self-pay | Admitting: Emergency Medicine

## 2017-04-17 ENCOUNTER — Emergency Department (HOSPITAL_COMMUNITY)
Admission: EM | Admit: 2017-04-17 | Discharge: 2017-04-17 | Disposition: A | Discharge status: Home / Self Care | Payer: Medicare Other | Attending: Emergency Medicine | Admitting: Emergency Medicine

## 2017-04-17 ENCOUNTER — Emergency Department (HOSPITAL_COMMUNITY): Payer: Medicare Other

## 2017-04-17 DIAGNOSIS — Z950 Presence of cardiac pacemaker: Secondary | ICD-10-CM | POA: Insufficient documentation

## 2017-04-17 DIAGNOSIS — Z7901 Long term (current) use of anticoagulants: Secondary | ICD-10-CM | POA: Insufficient documentation

## 2017-04-17 DIAGNOSIS — R069 Unspecified abnormalities of breathing: Secondary | ICD-10-CM | POA: Diagnosis not present

## 2017-04-17 DIAGNOSIS — R000 Tachycardia, unspecified: Secondary | ICD-10-CM | POA: Diagnosis not present

## 2017-04-17 DIAGNOSIS — I2510 Atherosclerotic heart disease of native coronary artery without angina pectoris: Secondary | ICD-10-CM | POA: Insufficient documentation

## 2017-04-17 DIAGNOSIS — I484 Atypical atrial flutter: Secondary | ICD-10-CM | POA: Insufficient documentation

## 2017-04-17 DIAGNOSIS — R079 Chest pain, unspecified: Secondary | ICD-10-CM | POA: Diagnosis not present

## 2017-04-17 DIAGNOSIS — Z87891 Personal history of nicotine dependence: Secondary | ICD-10-CM | POA: Diagnosis not present

## 2017-04-17 DIAGNOSIS — R42 Dizziness and giddiness: Secondary | ICD-10-CM | POA: Diagnosis present

## 2017-04-17 DIAGNOSIS — Z79899 Other long term (current) drug therapy: Secondary | ICD-10-CM | POA: Diagnosis not present

## 2017-04-17 DIAGNOSIS — I252 Old myocardial infarction: Secondary | ICD-10-CM | POA: Insufficient documentation

## 2017-04-17 DIAGNOSIS — I4891 Unspecified atrial fibrillation: Secondary | ICD-10-CM

## 2017-04-17 DIAGNOSIS — I10 Essential (primary) hypertension: Secondary | ICD-10-CM | POA: Diagnosis not present

## 2017-04-17 LAB — URINALYSIS, ROUTINE W REFLEX MICROSCOPIC
BACTERIA UA: NONE SEEN
BILIRUBIN URINE: NEGATIVE
Glucose, UA: NEGATIVE mg/dL
Ketones, ur: NEGATIVE mg/dL
NITRITE: NEGATIVE
PH: 7 (ref 5.0–8.0)
Protein, ur: NEGATIVE mg/dL
SPECIFIC GRAVITY, URINE: 1.008 (ref 1.005–1.030)

## 2017-04-17 LAB — CBC
HEMATOCRIT: 43.7 % (ref 36.0–46.0)
HEMOGLOBIN: 13.9 g/dL (ref 12.0–15.0)
MCH: 29.7 pg (ref 26.0–34.0)
MCHC: 31.8 g/dL (ref 30.0–36.0)
MCV: 93.4 fL (ref 78.0–100.0)
Platelets: 230 10*3/uL (ref 150–400)
RBC: 4.68 MIL/uL (ref 3.87–5.11)
RDW: 14.4 % (ref 11.5–15.5)
WBC: 6.9 10*3/uL (ref 4.0–10.5)

## 2017-04-17 LAB — PROTIME-INR
INR: 1.03
PROTHROMBIN TIME: 13.6 s (ref 11.4–15.2)

## 2017-04-17 LAB — BASIC METABOLIC PANEL
Anion gap: 11 (ref 5–15)
BUN: 11 mg/dL (ref 6–20)
CO2: 24 mmol/L (ref 22–32)
Calcium: 8.9 mg/dL (ref 8.9–10.3)
Chloride: 104 mmol/L (ref 101–111)
Creatinine, Ser: 0.66 mg/dL (ref 0.44–1.00)
GFR calc Af Amer: >60 mL/min (ref >60)
Glucose, Bld: 90 mg/dL (ref 65–99)
POTASSIUM: 3.8 mmol/L (ref 3.5–5.1)
Sodium: 139 mmol/L (ref 135–145)

## 2017-04-17 LAB — I-STAT TROPONIN, ED: Troponin i, poc: 0.02 ng/mL (ref 0.00–0.08)

## 2017-04-17 MED ORDER — SODIUM CHLORIDE 0.9 % IV BOLUS (SEPSIS)
500.0000 mL | Freq: Once | INTRAVENOUS | Status: AC
  Administered 2017-04-17: 500 mL via INTRAVENOUS

## 2017-04-17 MED ORDER — METOPROLOL TARTRATE 50 MG PO TABS: 50.0000 mg | ORAL_TABLET | Freq: Two times a day (BID) | ORAL | 0 refills | Status: DC

## 2017-04-17 MED ORDER — METOPROLOL TARTRATE 5 MG/5ML IV SOLN
5.0000 mg | Freq: Once | INTRAVENOUS | Status: AC
  Administered 2017-04-17: 5 mg via INTRAVENOUS
  Filled 2017-04-17: qty 5

## 2017-04-17 NOTE — ED Triage Notes (Signed)
Pt. Presents from [facility] for light headedness upon returning from the bathroom and uncomfortable feeling in chest. Patient converting in and out of a fib wirth PVC's. Patient has history of atrial fibrillation. Patient alert and oriented x 4. Patient currently being treated for recurrent UTI.

## 2017-04-17 NOTE — ED Provider Notes (Addendum)
[facility] DEPT Provider Note   CSN: [id number] Arrival date & time: 04/17/17  1702     History   Chief Complaint Chief Complaint  Patient presents with  . Atrial Fibrillation    HPI Donna [name] is a 76 y.o. female. She presents for evaluation of lightheadedness, which started today while she was at home.  She checked her blood pressure at that time and found to be high.  She was transported by EMS without intervention.  She has a history of atrial fibrillation, and a pacemaker.  She was on Lopressor 50 mg twice daily until her cardiologist's change it to 25 mg twice daily, about 6 months ago, because of low blood pressure.  She denies recent fever, chills, nausea, vomiting, focal weakness or paresthesia.  She is not having any chest pain or shortness of breath.  She is taking her usual medications.  She missed 1 dose of metoprolol, 2 days ago but is generally taking it as prescribed.  There are no other known modifying factors.    HPI  Past Medical History:  Diagnosis Date  . Atrial fibrillation ([place]) 10/17/2016   On Metoprolol, Eliquis  . Benign paroxysmal positional vertigo 10/18/2016  . CAD (coronary atherosclerotic disease) 10/17/2016   Hx MI  . Cystitis 10/18/2016  . Dysuria 10/18/2016  . History of anemia 10/18/2016  . History of gastric bypass 10/17/2016  . Hyperlipidemia 10/17/2016  . Lower extremity edema 10/18/2016  . Restless leg syndrome 10/17/2016  . Sleep disorder 10/17/2016   Modafinil for sleep apnea and narcolepsy  . Tobacco dependence 10/17/2016  . Urinary frequency 10/18/2016  . Vitamin D deficiency 10/18/2016    Patient Active Problem List   Diagnosis Date Noted  . Osteoporosis 03/20/2017  . Ischemic changes on head CT 03/01/2017  . Pacemaker 03/01/2017  . Prediabetes 03/01/2017  . Recurrent UTI 01/29/2017  . Mucinous cyst of left thumb 12/18/2016  . Chronic idiopathic constipation 12/08/2016  . Chronic diastolic congestive heart  failure ([place]) 11/08/2016  . Benign paroxysmal positional vertigo 10/18/2016  . Lower extremity edema 10/18/2016  . Acute pyelonephritis, right 10/18/2016  . Vitamin D deficiency 10/18/2016  . History of anemia 10/18/2016  . Restless leg syndrome 10/17/2016  . CAD (coronary atherosclerotic disease) 10/17/2016  . Paroxysmal atrial fibrillation ([place]) 10/17/2016  . History of gastric bypass 10/17/2016  . Hyperlipidemia 10/17/2016  . Sleep disorder 10/17/2016  . Tobacco dependence 10/17/2016    Past Surgical History:  Procedure Laterality Date  . CARDIAC CATHETERIZATION      OB History    No data available       Home Medications    Prior to Admission medications   Medication Sig Start Date End Date Taking? Authorizing Provider  atorvastatin (LIPITOR) 10 MG tablet Take 1 tablet (10 mg total) by mouth daily. 10/17/16  Yes [name], DO  Cholecalciferol (VITAMIN D3) 5000 units CAPS Take 5,000 Units by mouth daily.    Yes [provider]  clobetasol ointment (TEMOVATE) 0.63 % Apply 1 application topically 2 (two) times daily. 03/14/17  Yes Edrick Kins, DPM  co-enzyme Q-10 30 MG capsule Take 30 mg by mouth 2 (two) times daily.    Yes [provider]  denosumab (PROLIA) 60 MG/ML SOLN injection Inject 60 mg into the skin every 6 (six) months. Administer in upper arm, thigh, or abdomen   Yes [provider]  ELIQUIS 5 MG TABS tablet Take 1 tablet (5 mg total) by mouth 2 (two) times daily.  01/25/17  Yes [name], DO  Ferrous Sulfate (IRON) 325 (65 Fe) MG TABS Take 325 mg by mouth daily with breakfast.    Yes [provider]  folic acid (FOLVITE) 245 MCG tablet Take 800 mcg by mouth daily.   Yes [provider]  furosemide (LASIX) 20 MG tablet Take 1 tablet (20 mg total) by mouth as directed. Take as directed Patient taking differently: Take 20 mg by mouth daily as needed for fluid or edema.  10/30/16  Yes Camnitz, Will Hassell Done,  MD  gentamicin cream (GARAMYCIN) 0.1 % Apply 1 application topically 3 (three) times daily. Patient taking differently: Apply 1 application topically 3 (three) times daily. TO AFFECTED AREA(S) ON LEG(S) 02/28/17  Yes Edrick Kins, DPM  loperamide (IMODIUM) 2 MG capsule Take 2 mg by mouth See admin instructions. After AM bowel movement (as needed for leakage)   Yes [provider]  meclizine (ANTIVERT) 12.5 MG tablet Take 12.5 mg by mouth 2 (two) times daily.   Yes [provider]  modafinil (PROVIGIL) 200 MG tablet Take 1 tablet (200 mg total) by mouth 2 (two) times daily. 10/17/16  Yes [name], DO  nitrofurantoin (MACRODANTIN) 100 MG capsule Take 1 capsule (100 mg total) by mouth at bedtime. To prevent UTI 01/29/17  Yes Alexander, Lanelle Bal, DO  ondansetron (ZOFRAN-ODT) 8 MG disintegrating tablet Take 1 tablet (8 mg total) by mouth every 8 (eight) hours as needed for nausea. 11/24/16  Yes Silverio Decamp, MD  pramipexole (MIRAPEX) 0.25 MG tablet Take 1 tablet (0.25 mg total) by mouth 2 (two) times daily. 10/17/16  Yes [name], DO  metoprolol tartrate (LOPRESSOR) 50 MG tablet Take 1 tablet (50 mg total) by mouth 2 (two) times daily. 04/17/17   Daleen Bo, MD    Family History Family History  Problem Relation Age of Onset  . Emphysema Mother   . Heart disease Father   . Diabetes Father   . Emphysema Father   . Hypertension Sister   . Bladder Cancer Brother   . Hypertension Brother   . Heart attack Maternal Grandmother   . Kidney failure Paternal Grandmother   . Stroke Paternal Grandfather   . Hypertension Brother     Social History Social History  Substance Use Topics  . Smoking status: Former Smoker    Packs/day: 1.00    Years: 55.00  . Smokeless tobacco: Never Used  . Alcohol use No     Allergies   Vancomycin; Penicillins; and Tape   Review of Systems Review of Systems  All other systems reviewed and are  negative.    Physical Exam Updated Vital Signs BP 123/88   Pulse 98   Resp 13   Ht 5\' 4"  (1.626 m)   Wt 66.2 kg (146 lb)   SpO2 98%   BMI 25.06 kg/m   Physical Exam  Constitutional: She is oriented to person, place, and time. She appears well-developed.  Elderly, frail  HENT:  Head: Normocephalic and atraumatic.  Eyes: Conjunctivae and EOM are normal. Pupils are equal, round, and reactive to light.  Neck: Normal range of motion and phonation normal. Neck supple.  Cardiovascular:  Tachycardic.  Cardiac monitor with ectopy, which appears to be periods of bigeminy, with occasional couplets, and runs up to 3 followed by narrow complex QRS.  Rhythm is regular.  Pulmonary/Chest: Effort normal and breath sounds normal. She exhibits no tenderness.  Abdominal: Soft. She exhibits no distension. There is no tenderness. There is no  guarding.  Musculoskeletal: Normal range of motion. She exhibits no edema, tenderness or deformity.  Neurological: She is alert and oriented to person, place, and time. She exhibits normal muscle tone.  Skin: Skin is warm and dry.  Psychiatric: She has a normal mood and affect. Her behavior is normal. Judgment and thought content normal.  Nursing note and vitals reviewed.    ED Treatments / Results  Labs (all labs ordered are listed, but only abnormal results are displayed) Labs Reviewed  URINALYSIS, ROUTINE W REFLEX MICROSCOPIC - Abnormal; Notable for the following:       Result Value   Color, Urine STRAW (*)    Hgb urine dipstick SMALL (*)    Leukocytes, UA TRACE (*)    Squamous Epithelial / LPF 0-5 (*)    All other components within normal limits  BASIC METABOLIC PANEL  CBC  PROTIME-INR  I-STAT TROPOININ, ED    EKG  EKG Interpretation  Date/Time:  Tuesday Apr 17 2017 17:08:37 EDT Ventricular Rate:  103 PR Interval:    QRS Duration: 88 QT Interval:  340 QTC Calculation: 439 R Axis:   12 Text Interpretation:  Atrial fibrillation Paired  ventricular premature complexes Consider anterior infarct Minimal ST depression, inferior leads No old tracing to compare Confirmed by Daleen Bo [phone]) on 04/17/2017 6:00:16 PM       Radiology Dg Chest 2 View  Result Date: 04/17/2017 CLINICAL DATA:  Lightheadedness, uncomfortable feeling in chest, atrial fibrillation, hypertension, former smoker EXAM: CHEST  2 VIEW COMPARISON:  None FINDINGS: LEFT subclavian transvenous pacemaker leads project at RIGHT atrium and RIGHT ventricle. Normal heart size, mediastinal contours, and pulmonary vascularity. Atherosclerotic calcification aorta. Lungs minimally hyperinflated but clear. No infiltrate, pleural effusion or pneumothorax. Bones demineralized with evidence of prior cervical spine fusion. IMPRESSION: No acute abnormalities. Aortic atherosclerosis. Electronically Signed   By: Lavonia Dana M.D.   On: 04/17/2017 17:58    Procedures Procedures (including critical care time)  Medications Ordered in ED Medications  sodium chloride 0.9 % bolus 500 mL (500 mLs Intravenous New Bag/Given 04/17/17 1911)  metoprolol tartrate (LOPRESSOR) injection 5 mg (5 mg Intravenous Given 04/17/17 1911)     Initial Impression / Assessment and Plan / ED Course  I have reviewed the triage vital signs and the nursing notes.  Pertinent labs & imaging results that were available during my care of the patient were reviewed by me and considered in my medical decision making (see chart for details).      Patient Vitals for the past 24 hrs:  BP Pulse Resp SpO2 Height Weight  04/17/17 2000 123/88 98 - 98 % - -  04/17/17 1945 (!) 131/96 95 - 99 % - -  04/17/17 1943 - - - 99 % - -  04/17/17 1930 119/84 (!) 118 13 98 % - -  04/17/17 1915 118/86 (!) 124 12 98 % - -  04/17/17 1845 121/75 (!) 101 18 98 % - -  04/17/17 1830 117/80 (!) 101 13 98 % - -  04/17/17 1815 122/77 (!) 103 17 100 % - -  04/17/17 1730 134/90 (!) 101 14 98 % - -  04/17/17 1715 (!) 143/91 (!) 101 15  98 % - -  04/17/17 1705 - - - - 5\' 4"  (1.626 m) 66.2 kg (146 lb)  04/17/17 1702 - - - 99 % - -    The patient's [employer] pacemaker was interrogated.  I discussed the results with the technician who reviewed the  interrogation data.  The patient has a functioning dual-chamber cardiac pacemaker.  She has had runs of atrial flutter, with occasional RVR.  There has been no ventricular tachycardia, or ventricular ectopy, that could be discerned.  She has not had any ventricular pause.  The pacemaker is functioning normally.  20: 15-request for cardiology consultation, placed.  Discussed case with Dr. Kenton Kingfisher, who agrees with reinstating metoprolol 50 mg twice daily, to control symptoms.  8:17 PM Reevaluation with update and discussion. After initial assessment and treatment, an updated evaluation reveals she remains comfortable, is eating and has no further complaints.Daleen Bo L    Final Clinical Impressions(s) / ED Diagnoses   Final diagnoses:  Atypical atrial flutter (HCC)  Tachycardia    Atrial fibrillation by history with atrial flutter currently.  Hemodynamically stable, with periods of tachycardia.  Patient improved heart rate, with single dose of IV Lopressor.  No evident causative factors for worsening of cardiovascular status.  Patient able to eat in the emergency department.  Consultation with cardiology.   Nursing Notes Reviewed/ Care Coordinated Applicable Imaging Reviewed Interpretation of Laboratory Data incorporated into ED treatment  The patient appears reasonably screened and/or stabilized for discharge and I doubt any other medical condition or other [facility] requiring further screening, evaluation, or treatment in the ED at this time prior to discharge.  Plan: Home Medications-restart metoprolol, 50 mg twice daily; Home Treatments-rest, fluids; return here if the recommended treatment, does not improve the symptoms; Recommended follow up-cardiology follow-up in 1 or  2 weeks for reevaluation.   New Prescriptions Current Discharge Medication List       Daleen Bo, MD 04/17/17 2046    Daleen Bo, MD 04/17/17 2047

## 2017-04-17 NOTE — ED Notes (Signed)
Pacemaker interrelated and transmitted.

## 2017-04-17 NOTE — ED Notes (Signed)
Gave pt pre-packed bag lunch ([place] sandwich & applesauce) & Ginger Ale, per Jazmine - RN.

## 2017-04-17 NOTE — Discharge Instructions (Signed)
We are increasing your metoprolol, back to 50 mg twice daily.  Make sure that you take your medicines as directed.  Also try to eat and drink regularly, to prevent problems.  Return here, if needed, for problems.

## 2017-04-17 NOTE — ED Notes (Signed)
Patient given ginger ale to drink 

## 2017-04-18 ENCOUNTER — Telehealth: Payer: Self-pay | Admitting: Cardiology

## 2017-04-18 NOTE — Telephone Encounter (Signed)
Reports SOB when walking around, occasionally light-headed and weak.  She is going to continue to monitor and call office/go to ED if symptoms worsen. Pt is going to give increased Metoprolol dose time to "start working". Advised pt soonest appt is 6/5 and scheduled her w/ Dr. Curt Bears.   Pt understands office will call her back if sooner appt becomes available.  She thanks me for calling and speaking with her.

## 2017-04-18 NOTE — Telephone Encounter (Signed)
Donna [name] is calling because she went to the ER on yesterday for AFIB and was told to make an appt w/ Dr. Curt Bears . The next appt is w/ the PA on Tuesday 04/24/17, but Donna [name] is stating that her heart rate is still high and it was 102 the last she checked ,also she is unable to move around , when she gets up her blood pressure and heart goes up and would like to come in sooner. Please call

## 2017-04-18 NOTE — Telephone Encounter (Signed)
Follow up   Pt is calling back for Rn.

## 2017-04-20 ENCOUNTER — Telehealth: Payer: Self-pay | Admitting: Cardiology

## 2017-04-20 ENCOUNTER — Ambulatory Visit (INDEPENDENT_AMBULATORY_CARE_PROVIDER_SITE_OTHER): Payer: Medicare Other | Admitting: Cardiology

## 2017-04-20 ENCOUNTER — Ambulatory Visit (INDEPENDENT_AMBULATORY_CARE_PROVIDER_SITE_OTHER): Payer: Medicare Other | Admitting: Osteopathic Medicine

## 2017-04-20 ENCOUNTER — Encounter: Payer: Self-pay | Admitting: Osteopathic Medicine

## 2017-04-20 VITALS — BP 124/84 | HR 113 | Ht 64.0 in | Wt 149.0 lb

## 2017-04-20 VITALS — BP 118/83 | HR 111 | Wt 149.0 lb

## 2017-04-20 DIAGNOSIS — I480 Paroxysmal atrial fibrillation: Secondary | ICD-10-CM | POA: Diagnosis not present

## 2017-04-20 DIAGNOSIS — I4892 Unspecified atrial flutter: Secondary | ICD-10-CM | POA: Diagnosis not present

## 2017-04-20 DIAGNOSIS — R9431 Abnormal electrocardiogram [ECG] [EKG]: Secondary | ICD-10-CM | POA: Diagnosis not present

## 2017-04-20 DIAGNOSIS — I499 Cardiac arrhythmia, unspecified: Secondary | ICD-10-CM

## 2017-04-20 LAB — CMP AND LIVER
ALBUMIN: 3.8 g/dL (ref 3.6–5.1)
ALK PHOS: 76 U/L (ref 33–130)
ALT: 17 U/L (ref 6–29)
AST: 15 U/L (ref 10–35)
BUN: 15 mg/dL (ref 7–25)
Bilirubin, Direct: 0 mg/dL (ref <=0.2)
CALCIUM: 8.6 mg/dL (ref 8.6–10.4)
CHLORIDE: 104 mmol/L (ref 98–110)
CO2: 27 mmol/L (ref 20–31)
CREATININE: 0.69 mg/dL (ref 0.60–0.93)
GLUCOSE: 94 mg/dL (ref 65–99)
Indirect Bilirubin: 0.3 mg/dL (ref 0.2–1.2)
POTASSIUM: 4.8 mmol/L (ref 3.5–5.3)
SODIUM: 139 mmol/L (ref 135–146)
Total Bilirubin: 0.3 mg/dL (ref 0.2–1.2)
Total Protein: 6.7 g/dL (ref 6.1–8.1)

## 2017-04-20 LAB — CREATININE KINASE MB
CK, MB: 1.4 ng/mL (ref 0.0–5.0)
Relative Index: 4.1 — ABNORMAL HIGH (ref 0.0–4.0)

## 2017-04-20 LAB — CK: CK TOTAL: 34 U/L (ref 29–143)

## 2017-04-20 LAB — TROPONIN I

## 2017-04-20 MED ORDER — METOPROLOL TARTRATE 50 MG PO TABS: 75.0000 mg | ORAL_TABLET | Freq: Two times a day (BID) | ORAL | 3 refills | Status: DC

## 2017-04-20 NOTE — Progress Notes (Signed)
1.  Reason for visit: Abnormal EKG at PCP this morning  2.  Name of MD requesting visit:  Dr. Sheppard Coil  3. H&P:  See chart  4.  ROS related to problem:  n/a  5.  Assessment and plan per MD:    Pt seen at primary this morning w/ abn ekg.  Dr. Sheppard Coil called and asked that we see pt today. EKG performed in our office and pt is in AFlutter RVR w/ new LBBB,  HR 113.  Only symptoms are DOE & fatigue. Device interrogated. Reviewed w/ Dr. Radford Pax. Order received to increase Lopressor to 75 mg BID and keep appt on Tuesday w/ Camnitz.  Pt is agreeable to plan but concerned about BP dropping too low.  Pt advised to call office if she begins to experience low BP w/ or w/o SE.  Advised that she may also reduce dosage back down if pressure too low at home. She is agreeable to plan and will see [place] next week.

## 2017-04-20 NOTE — Progress Notes (Signed)
DOS 02/15/2017 Bunionectomy with osteotomy right foot: Tailors Bunionectomy right: Hammertoe repair two, three, four, five right foot: Hammertoe repair fourth digit left foot.

## 2017-04-20 NOTE — Telephone Encounter (Signed)
Spoke with Dr Sheppard Coil  EKG reviewed WIll have pt come in today   Scheduling knows to call

## 2017-04-20 NOTE — Patient Instructions (Signed)
Medication Instructions:    Your physician has recommended you make the following change in your medication:  1) INCREASE Lopressor to 75 mg twice daily  - If you need a refill on your cardiac medications before your next appointment, please call your pharmacy.   Labwork:  None ordered  Testing/Procedures:  None ordered  Follow-Up:  Keep scheduled follow up on 6/5 w/ Dr. Curt Bears  Thank you for choosing [facility]!!

## 2017-04-20 NOTE — Progress Notes (Addendum)
HPI: Donna [name] is a 76 y.o. female  who presents to [facility] today, 04/20/17,  for chief complaint of:  Chief Complaint  Patient presents with  . Hospitalization Follow-up    [place]     . Recently seen in ED 3 days ago for lightheadedness, released w/ dx atypical A-flutter, increased metoprolol to 50 mg bid and f/u w/ cardio. Her cardiologist wasn't available until next week, she has appt for 4 days from today.  . HR has been higher per patient since out of the ER, up to 110s almost all the time, continued SOB w/ exertion, no chest pain, still lightheaded, no dizziness.   Hx Afib w/pacemaker  ER notes reviewed:  "Cardiovascular:  Tachycardic.  Cardiac monitor with ectopy, which appears to be periods of bigeminy, with occasional couplets, and runs up to 3 followed by narrow complex QRS.  Rhythm is regular.   The patient's [employer] pacemaker was interrogated.  I discussed the results with the technician who reviewed the interrogation data.  The patient has a functioning dual-chamber cardiac pacemaker.  She has had runs of atrial flutter, with occasional RVR.  There has been no ventricular tachycardia, or ventricular ectopy, that could be discerned.  She has not had any ventricular pause.  The pacemaker is functioning normally.  20:15-request for cardiology consultation, placed.  Discussed case with Dr. Kenton Kingfisher, who agrees with reinstating metoprolol 50 mg twice daily, to control symptoms.  8:17 PM Reevaluation with update and discussion. After initial assessment and treatment, an updated evaluation reveals she remains comfortable, is eating and has no further complaints."  Recent cardio notes reviewed: seen 02/08/17  Hx Afib w/ multiple hospitalization and cardioversion, hx diastolic HF  Pacer in place for Afib,   "Personal review of the ekg ordered 10/30/17 shows sinus rhythm, rate 63"  "TTE 10/26/16  Review of the  above records today demonstrates:  - Left ventricle: The cavity size was normal. Wall thickness was increased in a pattern of mild LVH. Systolic function was normal. The estimated ejection fraction was in the range of 55% to 60%. Wall motion was normal; there were no regional wall motion abnormalities. Features are consistent with a pseudonormal left ventricular filling pattern, with concomitant abnormal relaxation and increased filling pressure (grade 2 diastolic dysfunction). - Aortic valve: Trileaflet; moderately calcified leaflets. Sclerosis without stenosis. - Mitral valve: Moderately calcified annulus. Mildly calcified leaflets . There was trivial regurgitation. - Left atrium: The atrium was moderately dilated. - Right ventricle: The cavity size was mildly dilated. Pacer wire or catheter noted in right ventricle. Systolic function was normal. - Right atrium: The atrium was moderately dilated. - Tricuspid valve: Peak RV-RA gradient (S): 26 mm Hg. - Pulmonary arteries: PA peak pressure: 29 mm Hg (S). - Inferior vena cava: The vessel was normal in size. The respirophasic diameter changes were in the normal range (>= 50%), consistent with normal central venous pressure."  Past medical history, surgical history, social history and family history reviewed.  Patient Active Problem List   Diagnosis Date Noted  . Osteoporosis 03/20/2017  . Ischemic changes on head CT 03/01/2017  . Pacemaker 03/01/2017  . Prediabetes 03/01/2017  . Recurrent UTI 01/29/2017  . Mucinous cyst of left thumb 12/18/2016  . Chronic idiopathic constipation 12/08/2016  . Chronic diastolic congestive heart failure ([place]) 11/08/2016  . Benign paroxysmal positional vertigo 10/18/2016  . Lower extremity edema 10/18/2016  . Acute pyelonephritis, right 10/18/2016  . Vitamin D deficiency  10/18/2016  . History of anemia 10/18/2016  . Restless leg syndrome 10/17/2016  . CAD (coronary  atherosclerotic disease) 10/17/2016  . Paroxysmal atrial fibrillation ([place]) 10/17/2016  . History of gastric bypass 10/17/2016  . Hyperlipidemia 10/17/2016  . Sleep disorder 10/17/2016  . Tobacco dependence 10/17/2016    Current medication list and allergy/intolerance information reviewed.   Current Outpatient Prescriptions on File Prior to Visit  Medication Sig Dispense Refill  . atorvastatin (LIPITOR) 10 MG tablet Take 1 tablet (10 mg total) by mouth daily. 90 tablet 3  . Cholecalciferol (VITAMIN D3) 5000 units CAPS Take 5,000 Units by mouth daily.     . clobetasol ointment (TEMOVATE) 0.08 % Apply 1 application topically 2 (two) times daily. 30 g 0  . co-enzyme Q-10 30 MG capsule Take 30 mg by mouth 2 (two) times daily.     [name] denosumab (PROLIA) 60 MG/ML SOLN injection Inject 60 mg into the skin every 6 (six) months. Administer in upper arm, thigh, or abdomen    . ELIQUIS 5 MG TABS tablet Take 1 tablet (5 mg total) by mouth 2 (two) times daily. 180 tablet 3  . Ferrous Sulfate (IRON) 325 (65 Fe) MG TABS Take 325 mg by mouth daily with breakfast.     . folic acid (FOLVITE) 676 MCG tablet Take 800 mcg by mouth daily.    . furosemide (LASIX) 20 MG tablet Take 1 tablet (20 mg total) by mouth as directed. Take as directed (Patient taking differently: Take 20 mg by mouth daily as needed for fluid or edema. ) 20 tablet 0  . gentamicin cream (GARAMYCIN) 0.1 % Apply 1 application topically 3 (three) times daily. (Patient taking differently: Apply 1 application topically 3 (three) times daily. TO AFFECTED AREA(S) ON LEG(S)) 30 g 1  . loperamide (IMODIUM) 2 MG capsule Take 2 mg by mouth See admin instructions. After AM bowel movement (as needed for leakage)    . meclizine (ANTIVERT) 12.5 MG tablet Take 12.5 mg by mouth 2 (two) times daily.    . metoprolol tartrate (LOPRESSOR) 50 MG tablet Take 1 tablet (50 mg total) by mouth 2 (two) times daily. 180 tablet 0  . modafinil (PROVIGIL) 200 MG tablet Take 1  tablet (200 mg total) by mouth 2 (two) times daily. 60 tablet 5  . nitrofurantoin (MACRODANTIN) 100 MG capsule Take 1 capsule (100 mg total) by mouth at bedtime. To prevent UTI 90 capsule 1  . ondansetron (ZOFRAN-ODT) 8 MG disintegrating tablet Take 1 tablet (8 mg total) by mouth every 8 (eight) hours as needed for nausea. 20 tablet 3  . pramipexole (MIRAPEX) 0.25 MG tablet Take 1 tablet (0.25 mg total) by mouth 2 (two) times daily. 180 tablet 3   No current facility-administered medications on file prior to visit.    Allergies  Allergen Reactions  . Vancomycin Anaphylaxis  . Penicillins Hives    Has patient had a PCN reaction causing immediate rash, facial/tongue/throat swelling, SOB or lightheadedness with hypotension: Yes Has patient had a PCN reaction causing severe rash involving mucus membranes or skin necrosis: No Has patient had a PCN reaction that required hospitalization: No Has patient had a PCN reaction occurring within the last 10 years: No If all of the above answers are "NO", then may proceed with Cephalosporin use.   . Tape Rash      Review of Systems:  Constitutional: No recent illness  HEENT: No  headache, no vision change  Cardiac: No  chest pain, No  pressure, +palpitations  Respiratory:  +shortness of breath as per HPI. No  Cough  Gastrointestinal: No  abdominal pain, no change on bowel habits  Skin: No  Rash   Neurologic: +generalized weakness, +chronic Dizziness with some worse lightheadedness lately per HPI   Exam:  BP 118/83   Pulse (!) 111   Wt 149 lb (67.6 kg)   BMI 25.58 kg/m   Constitutional: VS see above. General Appearance: alert, well-developed, well-nourished, NAD  Eyes: Normal lids and conjunctive, non-icteric sclera  Ears, Nose, Mouth, Throat: MMM, Normal external inspection ears/nares/mouth/lips/gums.  Neck: No masses, trachea midline.   Respiratory: Normal respiratory effort. no wheeze, no rhonchi, no rales  Cardiovascular:  S1/S2 normal, no murmur, no rub/gallop auscultated. Tachycardic. No LE edema  Musculoskeletal: Gait normal. Symmetric and independent movement of all extremities  Neurological: Normal balance/coordination. No tremor.  Skin: warm, dry, intact.   Psychiatric: Normal judgment/insight. Normal mood and affect. Oriented x3.    Results for orders placed or performed during the hospital encounter of 04/17/17 (from the past 72 hour(s))  Basic metabolic panel     Status: None   Collection Time: 04/17/17  5:08 PM  Result Value Ref Range   Sodium 139 135 - 145 mmol/L   Potassium 3.8 3.5 - 5.1 mmol/L   Chloride 104 101 - 111 mmol/L   CO2 24 22 - 32 mmol/L   Glucose, Bld 90 65 - 99 mg/dL   BUN 11 6 - 20 mg/dL   Creatinine, Ser 0.66 0.44 - 1.00 mg/dL   Calcium 8.9 8.9 - 10.3 mg/dL   GFR calc non Af Amer >60 >60 mL/min   GFR calc Af Amer >60 >60 mL/min    Comment: (NOTE) The eGFR has been calculated using the CKD EPI equation. This calculation has not been validated in all clinical situations. eGFR's persistently <60 mL/min signify possible Chronic Kidney Disease.    Anion gap 11 5 - 15  CBC     Status: None   Collection Time: 04/17/17  5:08 PM  Result Value Ref Range   WBC 6.9 4.0 - 10.5 K/uL   RBC 4.68 3.87 - 5.11 MIL/uL   Hemoglobin 13.9 12.0 - 15.0 g/dL   HCT 43.7 36.0 - 46.0 %   MCV 93.4 78.0 - 100.0 fL   MCH 29.7 26.0 - 34.0 pg   MCHC 31.8 30.0 - 36.0 g/dL   RDW 14.4 11.5 - 15.5 %   Platelets 230 150 - 400 K/uL  Protime-INR (order if Patient is taking Coumadin / Warfarin)     Status: None   Collection Time: 04/17/17  5:08 PM  Result Value Ref Range   Prothrombin Time 13.6 11.4 - 15.2 seconds   INR 1.03   I-stat troponin, ED     Status: None   Collection Time: 04/17/17  5:16 PM  Result Value Ref Range   Troponin i, poc 0.02 0.00 - 0.08 ng/mL   Comment 3            Comment: Due to the release kinetics of cTnI, a negative result within the first hours of the onset of  symptoms does not rule out myocardial infarction with certainty. If myocardial infarction is still suspected, repeat the test at appropriate intervals.   Urinalysis, Routine w reflex microscopic     Status: Abnormal   Collection Time: 04/17/17  7:06 PM  Result Value Ref Range   Color, Urine STRAW (A) YELLOW   APPearance CLEAR CLEAR   Specific Gravity,  Urine 1.008 1.005 - 1.030   pH 7.0 5.0 - 8.0   Glucose, UA NEGATIVE NEGATIVE mg/dL   Hgb urine dipstick SMALL (A) NEGATIVE   Bilirubin Urine NEGATIVE NEGATIVE   Ketones, ur NEGATIVE NEGATIVE mg/dL   Protein, ur NEGATIVE NEGATIVE mg/dL   Nitrite NEGATIVE NEGATIVE   Leukocytes, UA TRACE (A) NEGATIVE   RBC / HPF 0-5 0 - 5 RBC/hpf   WBC, UA 0-5 0 - 5 WBC/hpf   Bacteria, UA NONE SEEN NONE SEEN   Squamous Epithelial / LPF 0-5 (A) NONE SEEN     Dg Chest 2 View  Result Date: 04/17/2017 CLINICAL DATA:  Lightheadedness, uncomfortable feeling in chest, atrial fibrillation, hypertension, former smoker EXAM: CHEST  2 VIEW COMPARISON:  None FINDINGS: LEFT subclavian transvenous pacemaker leads project at RIGHT atrium and RIGHT ventricle. Normal heart size, mediastinal contours, and pulmonary vascularity. Atherosclerotic calcification aorta. Lungs minimally hyperinflated but clear. No infiltrate, pleural effusion or pneumothorax. Bones demineralized with evidence of prior cervical spine fusion. IMPRESSION: No acute abnormalities. Aortic atherosclerosis. Electronically Signed   By: Lavonia Dana M.D.   On: 04/17/2017 17:58     EKG today: Changes from ER - longer QRS, inverted T waves in V2, T wves taller but not really peaked noted in V4 V5 I don't see P waves       ASSESSMENT/PLAN: The primary encounter diagnosis was Irregular heart beats. Diagnoses of Paroxysmal atrial fibrillation (HCC) and Abnormal EKG were also pertinent to this visit.   Patient really wants to avoid ER again  EKG is certainly abnormal but without chest pain/angina  symptoms or obvious MI on EKG I'm somewhat reassured but will await stat labs. May have had MI in interim between ER visit and now.   I spoke with cardiology office reception staff, Dr Fransico Him is on call and I asked them to have her contact me on my cell for further discussion. Will forward her and primary cardio a copy of this note as FYI  I advised increase metoprolol further but patient does not want to change meds due to concern for blood pressure dropping too low, she feels it is too low already. I explained possibility of demand ischemia if persistently high HR, she understands and is more worried about blood pressure dropping.   Patient advised if abnormal labs or worsening symptoms, will insist on ER referral, otherwise ok to wait until cardio followup Tuesday, I will see her Monday to recheck vitals and EKG  Orders Placed This Encounter  Procedures  . Troponin I  . CKMB  . CMP and Liver  . EKG 12-Lead     ADDENDUM: Just spoke on phone 12:55 PM w/ on-call cardiologist Dr. Dorris Carnes, she will have her office schedule the patient for urgent appointment - I appreciate her input and assistance!    Follow-up plan: Return in about 3 days (around 04/23/2017) for recheck vitals and EKG, TO ER BEFORE THEN IF NEEDED.  Visit summary with medication list and pertinent instructions was printed for patient to review, alert [place] if any changes needed. All questions at time of visit were answered - patient instructed to contact office with any additional concerns. ER/RTC precautions were reviewed with the patient and understanding verbalized.

## 2017-04-20 NOTE — Telephone Encounter (Signed)
I called pt to confirm appt with [place] on Tuesday, she said she was hoping he could see her sooner. I explained to her that is the next day he is in the office. Dr. Sheppard Coil then got on the phone and stated pt having high heart rate and would like to discuss with the Dr. on call today

## 2017-04-23 ENCOUNTER — Ambulatory Visit (INDEPENDENT_AMBULATORY_CARE_PROVIDER_SITE_OTHER): Payer: Medicare Other | Admitting: Osteopathic Medicine

## 2017-04-23 ENCOUNTER — Encounter: Payer: Self-pay | Admitting: Osteopathic Medicine

## 2017-04-23 VITALS — BP 116/82 | HR 114 | Wt 151.0 lb

## 2017-04-23 DIAGNOSIS — I447 Left bundle-branch block, unspecified: Secondary | ICD-10-CM | POA: Diagnosis not present

## 2017-04-23 DIAGNOSIS — I4892 Unspecified atrial flutter: Secondary | ICD-10-CM | POA: Diagnosis not present

## 2017-04-23 NOTE — Progress Notes (Signed)
HPI: Donna [name] is a 76 y.o. female  who presents to [facility] today, 04/23/17,  for chief complaint of:  Chief Complaint  Patient presents with  . Follow-up    IRREGULAR HEART BEAT    Cardio notes reviewed, increased Lopressor which hasn't seemed to help much. Pt still experiencing fast HR at home and fatigue but no new chest pain/pressure, +SOB on exertion. No LOC/dizziness. She is nervous to increase beta blockers d/t concern for low BP. She is mainly here today to check BP and she declines repeat EKG today.   Past medical history, surgical history, social history and family history reviewed.  Patient Active Problem List   Diagnosis Date Noted  . Osteoporosis 03/20/2017  . Ischemic changes on head CT 03/01/2017  . Pacemaker 03/01/2017  . Prediabetes 03/01/2017  . Recurrent UTI 01/29/2017  . Mucinous cyst of left thumb 12/18/2016  . Chronic idiopathic constipation 12/08/2016  . Chronic diastolic congestive heart failure ([place]) 11/08/2016  . Benign paroxysmal positional vertigo 10/18/2016  . Lower extremity edema 10/18/2016  . Acute pyelonephritis, right 10/18/2016  . Vitamin D deficiency 10/18/2016  . History of anemia 10/18/2016  . Restless leg syndrome 10/17/2016  . CAD (coronary atherosclerotic disease) 10/17/2016  . Paroxysmal atrial fibrillation ([place]) 10/17/2016  . History of gastric bypass 10/17/2016  . Hyperlipidemia 10/17/2016  . Sleep disorder 10/17/2016  . Tobacco dependence 10/17/2016    Current medication list and allergy/intolerance information reviewed.   Current Outpatient Prescriptions on File Prior to Visit  Medication Sig Dispense Refill  . atorvastatin (LIPITOR) 10 MG tablet Take 1 tablet (10 mg total) by mouth daily. 90 tablet 3  . Cholecalciferol (VITAMIN D3) 5000 units CAPS Take 5,000 Units by mouth daily.     . clobetasol ointment (TEMOVATE) 8.58 % Apply 1 application topically 2 (two) times daily. 30 g 0   . co-enzyme Q-10 30 MG capsule Take 30 mg by mouth 2 (two) times daily.     [name] denosumab (PROLIA) 60 MG/ML SOLN injection Inject 60 mg into the skin every 6 (six) months. Administer in upper arm, thigh, or abdomen    . ELIQUIS 5 MG TABS tablet Take 1 tablet (5 mg total) by mouth 2 (two) times daily. 180 tablet 3  . Ferrous Sulfate (IRON) 325 (65 Fe) MG TABS Take 325 mg by mouth daily with breakfast.     . folic acid (FOLVITE) 850 MCG tablet Take 800 mcg by mouth daily.    . furosemide (LASIX) 20 MG tablet Take 1 tablet (20 mg total) by mouth as directed. Take as directed (Patient taking differently: Take 20 mg by mouth daily as needed for fluid or edema. ) 20 tablet 0  . gentamicin cream (GARAMYCIN) 0.1 % Apply 1 application topically 3 (three) times daily. (Patient taking differently: Apply 1 application topically 3 (three) times daily. TO AFFECTED AREA(S) ON LEG(S)) 30 g 1  . loperamide (IMODIUM) 2 MG capsule Take 2 mg by mouth See admin instructions. After AM bowel movement (as needed for leakage)    . meclizine (ANTIVERT) 12.5 MG tablet Take 12.5 mg by mouth 2 (two) times daily.    . metoprolol tartrate (LOPRESSOR) 50 MG tablet Take 1.5 tablets (75 mg total) by mouth 2 (two) times daily. 180 tablet 3  . modafinil (PROVIGIL) 200 MG tablet Take 1 tablet (200 mg total) by mouth 2 (two) times daily. 60 tablet 5  . nitrofurantoin (MACRODANTIN) 100 MG capsule Take 1 capsule (100  mg total) by mouth at bedtime. To prevent UTI 90 capsule 1  . ondansetron (ZOFRAN-ODT) 8 MG disintegrating tablet Take 1 tablet (8 mg total) by mouth every 8 (eight) hours as needed for nausea. 20 tablet 3  . pramipexole (MIRAPEX) 0.25 MG tablet Take 1 tablet (0.25 mg total) by mouth 2 (two) times daily. 180 tablet 3   No current facility-administered medications on file prior to visit.    Allergies  Allergen Reactions  . Vancomycin Anaphylaxis  . Penicillins Hives      . Tape Rash      Review of  Systems:  Constitutional: No recent illness, +fatigue  HEENT: +headache, no vision change  Cardiac: No  pressure, +palpitations  Respiratory:  +shortness of breath. No  Cough  Neurologic: +generalized weakness, No  Dizziness  Exam:  BP 116/82   Pulse (!) 114   Wt 151 lb (68.5 kg)   BMI 25.92 kg/m   Constitutional: VS see above. General Appearance: alert, well-developed, well-nourished, NAD  Respiratory: Normal respiratory effort. no wheeze, no rhonchi, no rales  Cardiovascular: S1/S2 normal, no murmur, no rub/gallop auscultated. Reg rhythm, tachycardic at about 100-110   Musculoskeletal: Gait normal. Symmetric and independent movement of all extremities  Neurological: Normal balance/coordination. No tremor.  Skin: warm, dry, intact.   Psychiatric: Normal judgment/insight. Normal mood and affect. Oriented x3.   Reviewed cardio notes: 04/20/2017 - Aflutter RVR w/ new LBBB   ASSESSMENT/PLAN: The primary encounter diagnosis was Atrial flutter, unspecified type ([place]). A diagnosis of Left bundle branch block was also pertinent to this visit.   BP ok but I'm reluctant to increase beta blocker further. Await f/u tomorrow w/ primary cardiologist.   Follow-up plan: Return for routine car when due, otherwise as recommended by cardiology .  All questions at time of visit were answered - patient instructed to contact office with any additional concerns. ER/RTC precautions were reviewed with the patient and understanding verbalized.

## 2017-04-24 ENCOUNTER — Encounter: Payer: Self-pay | Admitting: Cardiology

## 2017-04-24 ENCOUNTER — Encounter: Payer: Self-pay | Admitting: *Deleted

## 2017-04-24 ENCOUNTER — Ambulatory Visit (INDEPENDENT_AMBULATORY_CARE_PROVIDER_SITE_OTHER): Payer: Medicare Other | Admitting: Cardiology

## 2017-04-24 ENCOUNTER — Other Ambulatory Visit: Payer: Self-pay | Admitting: Cardiology

## 2017-04-24 VITALS — BP 106/68 | HR 116 | Ht 64.0 in | Wt 148.0 lb

## 2017-04-24 DIAGNOSIS — I10 Essential (primary) hypertension: Secondary | ICD-10-CM

## 2017-04-24 DIAGNOSIS — I495 Sick sinus syndrome: Secondary | ICD-10-CM

## 2017-04-24 DIAGNOSIS — I4892 Unspecified atrial flutter: Secondary | ICD-10-CM

## 2017-04-24 DIAGNOSIS — I5032 Chronic diastolic (congestive) heart failure: Secondary | ICD-10-CM | POA: Diagnosis not present

## 2017-04-24 LAB — CUP PACEART INCLINIC DEVICE CHECK
Brady Statistic RV Percent Paced: 1 %
Date Time Interrogation Session: 20180605040000
Implantable Lead Implant Date: 20150324
Implantable Lead Location: 753859
Implantable Lead Model: 4456
Implantable Lead Serial Number: 479154
Implantable Lead Serial Number: 531114
Implantable Pulse Generator Implant Date: 20150324
Lead Channel Impedance Value: 548 Ohm
Lead Channel Pacing Threshold Amplitude: 0.9 V
Lead Channel Pacing Threshold Pulse Width: 0.4 ms
Lead Channel Sensing Intrinsic Amplitude: 2 mV
Lead Channel Sensing Intrinsic Amplitude: 20.5 mV
Lead Channel Setting Sensing Sensitivity: 2.5 mV
MDC IDC LEAD IMPLANT DT: 20150324
MDC IDC LEAD LOCATION: 753860
MDC IDC MSMT LEADCHNL RA IMPEDANCE VALUE: 440 Ohm
MDC IDC PG SERIAL: 391090
MDC IDC SET LEADCHNL RA PACING AMPLITUDE: 2 V
MDC IDC SET LEADCHNL RV PACING AMPLITUDE: 2 V
MDC IDC SET LEADCHNL RV PACING PULSEWIDTH: 0.4 ms
MDC IDC STAT BRADY RA PERCENT PACED: 36 %

## 2017-04-24 NOTE — Progress Notes (Signed)
Electrophysiology Office Note   Date:  04/24/2017   ID:  Donna [name], DOB 28-May-1941, MRN [id number]  PCP:  [name], DO  Primary Electrophysiologist:  [name], MD    Chief Complaint  Patient presents with  . Pacemaker Check    AFib/AFlutter post hospital  . Shortness of Breath  . Dizziness     History of Present Illness: Donna [name] is a 76 y.o. female who presents today for electrophysiology evaluation.   She has a history of atrial fibrillation, and a pacemaker placed for her atrial fibrillation. She has had multiple hospitalizations and cardioversion for her atrial fibrillation. She presents today feeling poorly. She has been in atrial flutter for approximately 10 days. Her main complaint is weakness and fatigue. She cannot go about her daily activities without significant weakness and fatigue. Otherwise she has some mild chest pressure, but she feels that this is likely due to the fact that she is tachycardic.  Today, denies symptoms of orthopnea, PND, lower extremity edema, claudication, dizziness, presyncope, syncope, bleeding, or neurologic sequela. The patient is tolerating medications without difficulties.    Past Medical History:  Diagnosis Date  . Atrial fibrillation ([place]) 10/17/2016   On Metoprolol, Eliquis  . Benign paroxysmal positional vertigo 10/18/2016  . CAD (coronary atherosclerotic disease) 10/17/2016   Hx MI  . Cystitis 10/18/2016  . Dysuria 10/18/2016  . History of anemia 10/18/2016  . History of gastric bypass 10/17/2016  . Hyperlipidemia 10/17/2016  . Lower extremity edema 10/18/2016  . Restless leg syndrome 10/17/2016  . Sleep disorder 10/17/2016   Modafinil for sleep apnea and narcolepsy  . Tobacco dependence 10/17/2016  . Urinary frequency 10/18/2016  . Vitamin D deficiency 10/18/2016   Past Surgical History:  Procedure Laterality Date  . CARDIAC CATHETERIZATION       Current Outpatient  Prescriptions  Medication Sig Dispense Refill  . atorvastatin (LIPITOR) 10 MG tablet Take 1 tablet (10 mg total) by mouth daily. 90 tablet 3  . Cholecalciferol (VITAMIN D3) 5000 units CAPS Take 5,000 Units by mouth daily.     . clobetasol ointment (TEMOVATE) 3.26 % Apply 1 application topically 2 (two) times daily. 30 g 0  . co-enzyme Q-10 30 MG capsule Take 30 mg by mouth 2 (two) times daily.     [name] denosumab (PROLIA) 60 MG/ML SOLN injection Inject 60 mg into the skin every 6 (six) months. Administer in upper arm, thigh, or abdomen    . ELIQUIS 5 MG TABS tablet Take 1 tablet (5 mg total) by mouth 2 (two) times daily. 180 tablet 3  . Ferrous Sulfate (IRON) 325 (65 Fe) MG TABS Take 325 mg by mouth daily with breakfast.     . folic acid (FOLVITE) 712 MCG tablet Take 800 mcg by mouth daily.    . furosemide (LASIX) 20 MG tablet Take 1 tablet (20 mg total) by mouth as directed. Take as directed (Patient taking differently: Take 20 mg by mouth daily as needed for fluid or edema. ) 20 tablet 0  . gentamicin cream (GARAMYCIN) 0.1 % Apply 1 application topically 3 (three) times daily. (Patient taking differently: Apply 1 application topically 3 (three) times daily. TO AFFECTED AREA(S) ON LEG(S)) 30 g 1  . loperamide (IMODIUM) 2 MG capsule Take 2 mg by mouth See admin instructions. After AM bowel movement (as needed for leakage)    . meclizine (ANTIVERT) 12.5 MG tablet Take 12.5 mg by mouth 2 (two) times daily.    . metoprolol  tartrate (LOPRESSOR) 50 MG tablet Take 1.5 tablets (75 mg total) by mouth 2 (two) times daily. 180 tablet 3  . modafinil (PROVIGIL) 200 MG tablet Take 1 tablet (200 mg total) by mouth 2 (two) times daily. 60 tablet 5  . nitrofurantoin (MACRODANTIN) 100 MG capsule Take 1 capsule (100 mg total) by mouth at bedtime. To prevent UTI 90 capsule 1  . ondansetron (ZOFRAN-ODT) 8 MG disintegrating tablet Take 1 tablet (8 mg total) by mouth every 8 (eight) hours as needed for nausea. 20 tablet 3    . pramipexole (MIRAPEX) 0.25 MG tablet Take 1 tablet (0.25 mg total) by mouth 2 (two) times daily. 180 tablet 3   No current facility-administered medications for this visit.     Allergies:   Vancomycin; Penicillins; and Tape   Social History:  The patient  reports that she has quit smoking. She has a 55.00 pack-year smoking history. She has never used smokeless tobacco. She reports that she does not drink alcohol or use drugs.   Family History:  The patient's family history includes Bladder Cancer in her brother; Diabetes in her father; Emphysema in her father and mother; Heart attack in her maternal grandmother; Heart disease in her father; Hypertension in her brother, brother, and sister; Kidney failure in her paternal grandmother; Stroke in her paternal grandfather.    ROS:  Please see the history of present illness.   Otherwise, review of systems is positive for Fatigue, shortness of breath, chest pressure, palpitations, dizziness, headaches.   All other systems are reviewed and negative.     PHYSICAL EXAM: VS:  BP 106/68   Pulse (!) 116   Ht 5\' 4"  (1.626 m)   Wt 148 lb (67.1 kg)   BMI 25.40 kg/m  , BMI Body mass index is 25.4 kg/m. GEN: Well nourished, well developed, in no acute distress  HEENT: normal  Neck: no JVD, carotid bruits, or masses Cardiac: iRRR; no murmurs, rubs, or gallops,no edema  Respiratory:  clear to auscultation bilaterally, normal work of breathing GI: soft, nontender, nondistended, + BS MS: no deformity or atrophy  Skin: warm and dry, Device site well healed Neuro:  Strength and sensation are intact Psych: euthymic mood, full affect  EKG:  EKG is not ordered today. Personal review of the ekg ordered 04/20/17 shows atrial flutter   Personal review of the device interrogation today. Results in [place]: 10/17/2016: Brain Natriuretic Peptide 238.4; TSH 2.53 04/17/2017: Hemoglobin 13.9; Platelets 230 04/20/2017: ALT 17; BUN 15; Creat  0.69; Potassium 4.8; Sodium 139    Lipid Panel     Component Value Date/Time   CHOL 214 (H) 01/18/2017 0833   TRIG 81 01/18/2017 0833   HDL 109 01/18/2017 0833   CHOLHDL 2.0 01/18/2017 0833   VLDL 16 01/18/2017 0833   LDLCALC 89 01/18/2017 0833     Wt Readings from Last 3 Encounters:  04/24/17 148 lb (67.1 kg)  04/23/17 151 lb (68.5 kg)  04/20/17 149 lb (67.6 kg)      Other studies Reviewed: Additional studies/ records that were reviewed today include: TTE 10/26/16  Review of the above records today demonstrates:  - Left ventricle: The cavity size was normal. Wall thickness was   increased in a pattern of mild LVH. Systolic function was normal.   The estimated ejection fraction was in the range of 55% to 60%.   Wall motion was normal; there were no regional wall motion   abnormalities. Features are consistent  with a pseudonormal left   ventricular filling pattern, with concomitant abnormal relaxation   and increased filling pressure (grade 2 diastolic dysfunction). - Aortic valve: Trileaflet; moderately calcified leaflets.   Sclerosis without stenosis. - Mitral valve: Moderately calcified annulus. Mildly calcified   leaflets . There was trivial regurgitation. - Left atrium: The atrium was moderately dilated. - Right ventricle: The cavity size was mildly dilated. Pacer wire   or catheter noted in right ventricle. Systolic function was   normal. - Right atrium: The atrium was moderately dilated. - Tricuspid valve: Peak RV-RA gradient (S): 26 mm Hg. - Pulmonary arteries: PA peak pressure: 29 mm Hg (S). - Inferior vena cava: The vessel was normal in size. The   respirophasic diameter changes were in the normal range (>= 50%),   consistent with normal central venous pressure.   ASSESSMENT AND PLAN:  1.  Atrial fibrillation/flutter: Has symptoms of fatigue and shortness of breath. Has been compliant with her Eliquis for anticoagulation. We'll plan for cardioversion. Risks  and benefits discussed. She understands the risks and has agreed to the procedure. We'll also start her on 100 mg of flecainide twice a day for rhythm control.  2. Pacemaker: Stable leads, interrogation showed atrial flutter. No changes today.  3. Hypertension: Well-controlled, no changes made today.  4. Diastolic heart failure: Volume has been stable. No changes at this time.    Current medicines are reviewed at length with the patient today.   The patient does not have concerns regarding her medicines.  The following changes were made today:  Flecainide  Labs/ tests ordered today include:  No orders of the defined types were placed in this encounter.    Disposition:   FU with Adom Schoeneck 3 months  Signed, Halston Fairclough Meredith Leeds, MD  04/24/2017 2:54 PM     [address] [address] [phone] (office) [phone] (fax)

## 2017-04-24 NOTE — Patient Instructions (Addendum)
Medication Instructions:   Your physician has recommended you make the following change in your medication: 1) START Flecainide 100 mg twice a day --- you will start this medication 7-10 days prior to stress test  - If you need a refill on your cardiac medications before your next appointment, please call your pharmacy.   Labwork:  None ordered  Testing/Procedures: Your physician has recommended that you have a Cardioversion (DCCV). Electrical Cardioversion uses a jolt of electricity to your heart either through paddles or wired patches attached to your chest. This is a controlled, usually prescheduled, procedure. Defibrillation is done under light anesthesia in the hospital, and you usually go home the day of the procedure. This is done to get your heart back into a normal rhythm. You are not awake for the procedure. Please see the instruction sheet given to you today.   Your physician has requested that you have an exercise tolerance test (you will have this test performed 7-10 days after starting Flecainide medication). For further information please visit [email or web address]. Please also follow instruction sheet, as given.  Follow-Up:  Your physician recommends that you schedule a follow-up appointment in: 3 months with Dr. Curt Bears.  Thank you for choosing [facility]!!   Trinidad Curet, RN [phone]  Any Other Special Instructions Will Be Listed Below (If Applicable).  Flecainide tablets What is this medicine? FLECAINIDE (FLEK a nide) is an antiarrhythmic drug. This medicine is used to prevent irregular heart rhythm. It can also slow down fast heartbeats called tachycardia. This medicine may be used for other purposes; ask your health care provider or pharmacist if you have questions. COMMON BRAND NAME(S): Tambocor What should I tell my health care provider before I take this medicine? They need to know if you have any of these conditions: -abnormal levels of potassium  in the blood -heart disease including heart rhythm and heart rate problems -kidney or liver disease -recent heart attack -an unusual or allergic reaction to flecainide, local anesthetics, other medicines, foods, dyes, or preservatives -pregnant or trying to get pregnant -breast-feeding How should I use this medicine? Take this medicine by mouth with a glass of water. Follow the directions on the prescription label. You can take this medicine with or without food. Take your doses at regular intervals. Do not take your medicine more often than directed. Do not stop taking this medicine suddenly. This may cause serious, heart-related side effects. If your doctor wants you to stop the medicine, the dose may be slowly lowered over time to avoid any side effects. Talk to your pediatrician regarding the use of this medicine in children. While this drug may be prescribed for children as young as 1 year of age for selected conditions, precautions do apply. Overdosage: If you think you have taken too much of this medicine contact a poison control center or emergency room at once. NOTE: This medicine is only for you. Do not share this medicine with others. What if I miss a dose? If you miss a dose, take it as soon as you can. If it is almost time for your next dose, take only that dose. Do not take double or extra doses. What may interact with this medicine? Do not take this medicine with any of the following medications: -amoxapine -arsenic trioxide -certain antibiotics like clarithromycin, erythromycin, gatifloxacin, gemifloxacin, levofloxacin, moxifloxacin, sparfloxacin, or troleandomycin -certain antidepressants called tricyclic antidepressants like amitriptyline, imipramine, or nortriptyline -certain medicines to control heart rhythm like disopyramide, dofetilide, encainide, moricizine, procainamide, propafenone, and  quinidine -cisapride -cyclobenzaprine -delavirdine -droperidol -haloperidol -hawthorn -imatinib -levomethadyl -maprotiline -medicines for malaria like chloroquine and halofantrine -pentamidine -phenothiazines like chlorpromazine, mesoridazine, prochlorperazine, thioridazine -pimozide -quinine -ranolazine -ritonavir -sertindole -ziprasidone This medicine may also interact with the following medications: -cimetidine -medicines for angina or high blood pressure -medicines to control heart rhythm like amiodarone and digoxin This list may not describe all possible interactions. Give your health care provider a list of all the medicines, herbs, non-prescription drugs, or dietary supplements you use. Also tell them if you smoke, drink alcohol, or use illegal drugs. Some items may interact with your medicine. What should I watch for while using this medicine? Visit your doctor or health care professional for regular checks on your progress. Because your condition and the use of this medicine carries some risk, it is a good idea to carry an identification card, necklace or bracelet with details of your condition, medications and doctor or health care professional. Check your blood pressure and pulse rate regularly. Ask your health care professional what your blood pressure and pulse rate should be, and when you should contact him or her. Your doctor or health care professional also may schedule regular blood tests and electrocardiograms to check your progress. You may get drowsy or dizzy. Do not drive, use machinery, or do anything that needs mental alertness until you know how this medicine affects you. Do not stand or sit up quickly, especially if you are an older patient. This reduces the risk of dizzy or fainting spells. Alcohol can make you more dizzy, increase flushing and rapid heartbeats. Avoid alcoholic drinks. What side effects may I notice from receiving this medicine? Side effects  that you should report to your doctor or health care professional as soon as possible: -chest pain, continued irregular heartbeats -difficulty breathing -swelling of the legs or feet -trembling, shaking -unusually weak or tired Side effects that usually do not require medical attention (report to your doctor or health care professional if they continue or are bothersome): -blurred vision -constipation -headache -nausea, vomiting -stomach pain This list may not describe all possible side effects. Call your doctor for medical advice about side effects. You may report side effects to FDA at 1-800-FDA-1088. Where should I keep my medicine? Keep out of the reach of children. Store at room temperature between 15 and 30 degrees C (59 and 86 degrees F). Protect from light. Keep container tightly closed. Throw away any unused medicine after the expiration date. NOTE: This sheet is a summary. It may not cover all possible information. If you have questions about this medicine, talk to your doctor, pharmacist, or health care provider.  2018 Elsevier/Gold Standard (2008-03-11 16:46:09)

## 2017-04-25 ENCOUNTER — Other Ambulatory Visit: Payer: Self-pay | Admitting: Cardiology

## 2017-04-26 ENCOUNTER — Telehealth: Payer: Self-pay | Admitting: *Deleted

## 2017-04-26 MED ORDER — FLECAINIDE ACETATE 100 MG PO TABS: 100.0000 mg | ORAL_TABLET | Freq: Two times a day (BID) | ORAL | 3 refills | Status: DC

## 2017-04-26 NOTE — Telephone Encounter (Signed)
Left detailed message on voicemail (DPR on file) advising pt to start Flecainide 100 mg BID on 6/9. Flecainide rx sent to [facility]

## 2017-04-27 ENCOUNTER — Ambulatory Visit (HOSPITAL_COMMUNITY): Payer: Medicare Other | Admitting: Certified Registered Nurse Anesthetist

## 2017-04-27 ENCOUNTER — Encounter (HOSPITAL_COMMUNITY)
Admission: RE | Disposition: A | Discharge status: Home / Self Care | Payer: Self-pay | Source: Ambulatory Visit | Attending: Cardiovascular Disease

## 2017-04-27 ENCOUNTER — Telehealth: Payer: Self-pay | Admitting: *Deleted

## 2017-04-27 ENCOUNTER — Ambulatory Visit (HOSPITAL_COMMUNITY)
Admission: RE | Admit: 2017-04-27 | Discharge: 2017-04-27 | Disposition: A | Discharge status: Home / Self Care | Payer: Medicare Other | Source: Ambulatory Visit | Attending: Cardiovascular Disease | Admitting: Cardiovascular Disease

## 2017-04-27 ENCOUNTER — Encounter (HOSPITAL_COMMUNITY): Payer: Self-pay

## 2017-04-27 DIAGNOSIS — I110 Hypertensive heart disease with heart failure: Secondary | ICD-10-CM | POA: Insufficient documentation

## 2017-04-27 DIAGNOSIS — E559 Vitamin D deficiency, unspecified: Secondary | ICD-10-CM | POA: Diagnosis not present

## 2017-04-27 DIAGNOSIS — I5030 Unspecified diastolic (congestive) heart failure: Secondary | ICD-10-CM | POA: Insufficient documentation

## 2017-04-27 DIAGNOSIS — I252 Old myocardial infarction: Secondary | ICD-10-CM | POA: Diagnosis not present

## 2017-04-27 DIAGNOSIS — Z8052 Family history of malignant neoplasm of bladder: Secondary | ICD-10-CM | POA: Insufficient documentation

## 2017-04-27 DIAGNOSIS — Z836 Family history of other diseases of the respiratory system: Secondary | ICD-10-CM | POA: Insufficient documentation

## 2017-04-27 DIAGNOSIS — H8110 Benign paroxysmal vertigo, unspecified ear: Secondary | ICD-10-CM | POA: Insufficient documentation

## 2017-04-27 DIAGNOSIS — Z8249 Family history of ischemic heart disease and other diseases of the circulatory system: Secondary | ICD-10-CM | POA: Diagnosis not present

## 2017-04-27 DIAGNOSIS — Z79899 Other long term (current) drug therapy: Secondary | ICD-10-CM | POA: Insufficient documentation

## 2017-04-27 DIAGNOSIS — Z881 Allergy status to other antibiotic agents status: Secondary | ICD-10-CM | POA: Diagnosis not present

## 2017-04-27 DIAGNOSIS — Z87891 Personal history of nicotine dependence: Secondary | ICD-10-CM | POA: Diagnosis not present

## 2017-04-27 DIAGNOSIS — Z833 Family history of diabetes mellitus: Secondary | ICD-10-CM | POA: Insufficient documentation

## 2017-04-27 DIAGNOSIS — G2581 Restless legs syndrome: Secondary | ICD-10-CM | POA: Insufficient documentation

## 2017-04-27 DIAGNOSIS — I4891 Unspecified atrial fibrillation: Secondary | ICD-10-CM | POA: Diagnosis not present

## 2017-04-27 DIAGNOSIS — Z7901 Long term (current) use of anticoagulants: Secondary | ICD-10-CM | POA: Diagnosis not present

## 2017-04-27 DIAGNOSIS — Z823 Family history of stroke: Secondary | ICD-10-CM | POA: Insufficient documentation

## 2017-04-27 DIAGNOSIS — Z91048 Other nonmedicinal substance allergy status: Secondary | ICD-10-CM | POA: Diagnosis not present

## 2017-04-27 DIAGNOSIS — Z880 Allergy status to penicillin: Secondary | ICD-10-CM | POA: Diagnosis not present

## 2017-04-27 DIAGNOSIS — I4892 Unspecified atrial flutter: Secondary | ICD-10-CM

## 2017-04-27 DIAGNOSIS — Z9889 Other specified postprocedural states: Secondary | ICD-10-CM | POA: Diagnosis not present

## 2017-04-27 DIAGNOSIS — Z9884 Bariatric surgery status: Secondary | ICD-10-CM | POA: Insufficient documentation

## 2017-04-27 DIAGNOSIS — I2510 Atherosclerotic heart disease of native coronary artery without angina pectoris: Secondary | ICD-10-CM | POA: Insufficient documentation

## 2017-04-27 DIAGNOSIS — G4730 Sleep apnea, unspecified: Secondary | ICD-10-CM | POA: Diagnosis not present

## 2017-04-27 DIAGNOSIS — E785 Hyperlipidemia, unspecified: Secondary | ICD-10-CM | POA: Diagnosis not present

## 2017-04-27 DIAGNOSIS — Z950 Presence of cardiac pacemaker: Secondary | ICD-10-CM | POA: Insufficient documentation

## 2017-04-27 DIAGNOSIS — G47419 Narcolepsy without cataplexy: Secondary | ICD-10-CM | POA: Insufficient documentation

## 2017-04-27 DIAGNOSIS — Z841 Family history of disorders of kidney and ureter: Secondary | ICD-10-CM | POA: Insufficient documentation

## 2017-04-27 HISTORY — PX: CARDIOVERSION: SHX1299

## 2017-04-27 SURGERY — CARDIOVERSION
Anesthesia: General

## 2017-04-27 MED ORDER — PROPOFOL 10 MG/ML IV BOLUS
INTRAVENOUS | Status: DC | PRN
  Administered 2017-04-27: 20 mg via INTRAVENOUS
  Administered 2017-04-27: 30 mg via INTRAVENOUS

## 2017-04-27 MED ORDER — SODIUM CHLORIDE 0.9 % IV SOLN
INTRAVENOUS | Status: DC | PRN
  Administered 2017-04-27: 10:00:00 via INTRAVENOUS

## 2017-04-27 MED ORDER — METOPROLOL TARTRATE 50 MG PO TABS: 75.0000 mg | ORAL_TABLET | Freq: Two times a day (BID) | ORAL | 3 refills | Status: DC

## 2017-04-27 NOTE — Anesthesia Preprocedure Evaluation (Signed)
Anesthesia Evaluation  Patient identified by MRN, date of birth, ID band Patient awake    Reviewed: Allergy & Precautions, NPO status , Patient's Chart, lab work & pertinent test results, reviewed documented beta blocker date and time   History of Anesthesia Complications Negative for: history of anesthetic complications  Airway Mallampati: II  TM Distance: >3 FB Neck ROM: Full    Dental  (+) Partial Lower, Missing, Poor Dentition   Pulmonary neg shortness of breath, sleep apnea , neg COPD, neg recent URI, former smoker,    breath sounds clear to auscultation       Cardiovascular + CAD and +CHF  + dysrhythmias Atrial Fibrillation + pacemaker  Rhythm:Irregular     Neuro/Psych negative neurological ROS     GI/Hepatic negative GI ROS, Neg liver ROS,   Endo/Other  negative endocrine ROS  Renal/GU negative Renal ROS     Musculoskeletal   Abdominal   Peds  Hematology negative hematology ROS (+)   Anesthesia Other Findings   Reproductive/Obstetrics                             Anesthesia Physical Anesthesia Plan  ASA: II  Anesthesia Plan: General   Post-op Pain Management:    Induction: Intravenous  PONV Risk Score and Plan: 3 and Propofol and Treatment may vary due to age  Airway Management Planned: Mask  Additional Equipment: None  Intra-op Plan:   Post-operative Plan:   Informed Consent: I have reviewed the patients History and Physical, chart, labs and discussed the procedure including the risks, benefits and alternatives for the proposed anesthesia with the patient or authorized representative who has indicated his/her understanding and acceptance.   Dental advisory given  Plan Discussed with: CRNA and Surgeon  Anesthesia Plan Comments:         Anesthesia Quick Evaluation

## 2017-04-27 NOTE — Transfer of Care (Signed)
Immediate Anesthesia Transfer of Care Note  Patient: Donna [name]  Procedure(s) Performed: Procedure(s): CARDIOVERSION (N/A)  Patient Location: Endoscopy Unit  Anesthesia Type:General  Level of Consciousness: awake, alert , oriented and patient cooperative  Airway & Oxygen Therapy: Patient Spontanous Breathing  Post-op Assessment: Report given to RN and Post -op Vital signs reviewed and stable  Post vital signs: Reviewed and stable  Last Vitals:  Vitals:   04/27/17 0955 04/27/17 0956  BP:  106/77  Pulse:  (!) 59  Resp: (!) 40 15  Temp:      Last Pain:  Vitals:   04/27/17 0908  TempSrc: Oral         Complications: No apparent anesthesia complications

## 2017-04-27 NOTE — Telephone Encounter (Signed)
-----   Message from Thayer Headings, MD sent at 04/27/2017  9:57 AM EDT ----- S/p cardioversion  She has increased her dose of metoprolol to 75 PO BID but needs a script for the increased dose. Will you send in a script for metoprolol [facility] .  Federico Flake,  [place],   Thanks  [employer]

## 2017-04-27 NOTE — CV Procedure (Signed)
    Cardioversion Note  Alexea Blase [id number] 28-Dec-1940  Procedure: DC Cardioversion Indications: atrial flutter   Procedure Details Consent: Obtained Time Out: Verified patient identification, verified procedure, site/side was marked, verified correct patient position, special equipment/implants available, Radiology Safety Procedures followed,  medications/allergies/relevent history reviewed, required imaging and test results available.  Performed  The patient has been on adequate anticoagulation.  The patient received IV Propofol 50 mg IV  for sedation.  Synchronous cardioversion was performed at 120  joules.  The cardioversion was successful     Complications: No apparent complications Patient did tolerate procedure well.   Thayer Headings, Brooke Bonito., MD, [facility] 04/27/2017, 10:41 AM

## 2017-04-27 NOTE — Discharge Instructions (Signed)
Electrical Cardioversion, Care After °This sheet gives you information about how to care for yourself after your procedure. Your health care provider may also give you more specific instructions. If you have problems or questions, contact your health care provider. °What can I expect after the procedure? °After the procedure, it is common to have: °· Some redness on the skin where the shocks were given. ° °Follow these instructions at home: °· Do not drive for 24 hours if you were given a medicine to help you relax (sedative). °· Take over-the-counter and prescription medicines only as told by your health care provider. °· Ask your health care provider how to check your pulse. Check it often. °· Rest for 48 hours after the procedure or as told by your health care provider. °· Avoid or limit your caffeine use as told by your health care provider. °Contact a health care provider if: °· You feel like your heart is beating too quickly or your pulse is not regular. °· You have a serious muscle cramp that does not go away. °Get help right away if: °· You have discomfort in your chest. °· You are dizzy or you feel faint. °· You have trouble breathing or you are short of breath. °· Your speech is slurred. °· You have trouble moving an arm or leg on one side of your body. °· Your fingers or toes turn cold or blue. °This information is not intended to replace advice given to you by your health care provider. Make sure you discuss any questions you have with your health care provider. °Document Released: 08/27/2013 Document Revised: 06/09/2016 Document Reviewed: 05/12/2016 °Elsevier Interactive Patient Education © 2018 Elsevier Inc. ° °

## 2017-04-27 NOTE — Anesthesia Postprocedure Evaluation (Signed)
Anesthesia Post Note  Patient: Donna [name]  Procedure(s) Performed: Procedure(s) (LRB): CARDIOVERSION (N/A)     Patient location during evaluation: Endoscopy Anesthesia Type: General Level of consciousness: awake and alert Pain management: pain level controlled Vital Signs Assessment: post-procedure vital signs reviewed and stable Respiratory status: spontaneous breathing, nonlabored ventilation, respiratory function stable and patient connected to nasal cannula oxygen Cardiovascular status: stable Postop Assessment: no signs of nausea or vomiting Anesthetic complications: no    Last Vitals:  Vitals:   04/27/17 1027 04/27/17 1040  BP: 95/61 105/69  Pulse: (!) 59 (!) 58  Resp: (!) 23 11  Temp:      Last Pain:  Vitals:   04/27/17 1005  TempSrc: Oral                 Zania Kalisz

## 2017-04-27 NOTE — Progress Notes (Signed)
Placed in trendelenberg, IVF open.

## 2017-04-27 NOTE — Interval H&P Note (Signed)
History and Physical Interval Note:  04/27/2017 9:47 AM  Jen Benedict  has presented today for surgery, with the diagnosis of AFIB  The various methods of treatment have been discussed with the patient and family. After consideration of risks, benefits and other options for treatment, the patient has consented to  Procedure(s): CARDIOVERSION (N/A) as a surgical intervention .  The patient's history has been reviewed, patient examined, no change in status, stable for surgery.  I have reviewed the patient's chart and labs.  Questions were answered to the patient's satisfaction.     Mertie Moores

## 2017-04-27 NOTE — H&P (View-Only) (Signed)
Electrophysiology Office Note   Date:  04/24/2017   ID:  Donna [name], DOB 24-Sep-1941, MRN [id number]  PCP:  [name], DO  Primary Electrophysiologist:  [name], MD    Chief Complaint  Patient presents with  . Pacemaker Check    AFib/AFlutter post hospital  . Shortness of Breath  . Dizziness     History of Present Illness: Donna [name] is a 76 y.o. female who presents today for electrophysiology evaluation.   She has a history of atrial fibrillation, and a pacemaker placed for her atrial fibrillation. She has had multiple hospitalizations and cardioversion for her atrial fibrillation. She presents today feeling poorly. She has been in atrial flutter for approximately 10 days. Her main complaint is weakness and fatigue. She cannot go about her daily activities without significant weakness and fatigue. Otherwise she has some mild chest pressure, but she feels that this is likely due to the fact that she is tachycardic.  Today, denies symptoms of orthopnea, PND, lower extremity edema, claudication, dizziness, presyncope, syncope, bleeding, or neurologic sequela. The patient is tolerating medications without difficulties.    Past Medical History:  Diagnosis Date  . Atrial fibrillation ([place]) 10/17/2016   On Metoprolol, Eliquis  . Benign paroxysmal positional vertigo 10/18/2016  . CAD (coronary atherosclerotic disease) 10/17/2016   Hx MI  . Cystitis 10/18/2016  . Dysuria 10/18/2016  . History of anemia 10/18/2016  . History of gastric bypass 10/17/2016  . Hyperlipidemia 10/17/2016  . Lower extremity edema 10/18/2016  . Restless leg syndrome 10/17/2016  . Sleep disorder 10/17/2016   Modafinil for sleep apnea and narcolepsy  . Tobacco dependence 10/17/2016  . Urinary frequency 10/18/2016  . Vitamin D deficiency 10/18/2016   Past Surgical History:  Procedure Laterality Date  . CARDIAC CATHETERIZATION       Current Outpatient  Prescriptions  Medication Sig Dispense Refill  . atorvastatin (LIPITOR) 10 MG tablet Take 1 tablet (10 mg total) by mouth daily. 90 tablet 3  . Cholecalciferol (VITAMIN D3) 5000 units CAPS Take 5,000 Units by mouth daily.     . clobetasol ointment (TEMOVATE) 4.40 % Apply 1 application topically 2 (two) times daily. 30 g 0  . co-enzyme Q-10 30 MG capsule Take 30 mg by mouth 2 (two) times daily.     [name] denosumab (PROLIA) 60 MG/ML SOLN injection Inject 60 mg into the skin every 6 (six) months. Administer in upper arm, thigh, or abdomen    . ELIQUIS 5 MG TABS tablet Take 1 tablet (5 mg total) by mouth 2 (two) times daily. 180 tablet 3  . Ferrous Sulfate (IRON) 325 (65 Fe) MG TABS Take 325 mg by mouth daily with breakfast.     . folic acid (FOLVITE) 102 MCG tablet Take 800 mcg by mouth daily.    . furosemide (LASIX) 20 MG tablet Take 1 tablet (20 mg total) by mouth as directed. Take as directed (Patient taking differently: Take 20 mg by mouth daily as needed for fluid or edema. ) 20 tablet 0  . gentamicin cream (GARAMYCIN) 0.1 % Apply 1 application topically 3 (three) times daily. (Patient taking differently: Apply 1 application topically 3 (three) times daily. TO AFFECTED AREA(S) ON LEG(S)) 30 g 1  . loperamide (IMODIUM) 2 MG capsule Take 2 mg by mouth See admin instructions. After AM bowel movement (as needed for leakage)    . meclizine (ANTIVERT) 12.5 MG tablet Take 12.5 mg by mouth 2 (two) times daily.    . metoprolol  tartrate (LOPRESSOR) 50 MG tablet Take 1.5 tablets (75 mg total) by mouth 2 (two) times daily. 180 tablet 3  . modafinil (PROVIGIL) 200 MG tablet Take 1 tablet (200 mg total) by mouth 2 (two) times daily. 60 tablet 5  . nitrofurantoin (MACRODANTIN) 100 MG capsule Take 1 capsule (100 mg total) by mouth at bedtime. To prevent UTI 90 capsule 1  . ondansetron (ZOFRAN-ODT) 8 MG disintegrating tablet Take 1 tablet (8 mg total) by mouth every 8 (eight) hours as needed for nausea. 20 tablet 3    . pramipexole (MIRAPEX) 0.25 MG tablet Take 1 tablet (0.25 mg total) by mouth 2 (two) times daily. 180 tablet 3   No current facility-administered medications for this visit.     Allergies:   Vancomycin; Penicillins; and Tape   Social History:  The patient  reports that she has quit smoking. She has a 55.00 pack-year smoking history. She has never used smokeless tobacco. She reports that she does not drink alcohol or use drugs.   Family History:  The patient's family history includes Bladder Cancer in her brother; Diabetes in her father; Emphysema in her father and mother; Heart attack in her maternal grandmother; Heart disease in her father; Hypertension in her brother, brother, and sister; Kidney failure in her paternal grandmother; Stroke in her paternal grandfather.    ROS:  Please see the history of present illness.   Otherwise, review of systems is positive for Fatigue, shortness of breath, chest pressure, palpitations, dizziness, headaches.   All other systems are reviewed and negative.     PHYSICAL EXAM: VS:  BP 106/68   Pulse (!) 116   Ht 5\' 4"  (1.626 m)   Wt 148 lb (67.1 kg)   BMI 25.40 kg/m  , BMI Body mass index is 25.4 kg/m. GEN: Well nourished, well developed, in no acute distress  HEENT: normal  Neck: no JVD, carotid bruits, or masses Cardiac: iRRR; no murmurs, rubs, or gallops,no edema  Respiratory:  clear to auscultation bilaterally, normal work of breathing GI: soft, nontender, nondistended, + BS MS: no deformity or atrophy  Skin: warm and dry, Device site well healed Neuro:  Strength and sensation are intact Psych: euthymic mood, full affect  EKG:  EKG is not ordered today. Personal review of the ekg ordered 04/20/17 shows atrial flutter   Personal review of the device interrogation today. Results in [place]: 10/17/2016: Brain Natriuretic Peptide 238.4; TSH 2.53 04/17/2017: Hemoglobin 13.9; Platelets 230 04/20/2017: ALT 17; BUN 15; Creat  0.69; Potassium 4.8; Sodium 139    Lipid Panel     Component Value Date/Time   CHOL 214 (H) 01/18/2017 0833   TRIG 81 01/18/2017 0833   HDL 109 01/18/2017 0833   CHOLHDL 2.0 01/18/2017 0833   VLDL 16 01/18/2017 0833   LDLCALC 89 01/18/2017 0833     Wt Readings from Last 3 Encounters:  04/24/17 148 lb (67.1 kg)  04/23/17 151 lb (68.5 kg)  04/20/17 149 lb (67.6 kg)      Other studies Reviewed: Additional studies/ records that were reviewed today include: TTE 10/26/16  Review of the above records today demonstrates:  - Left ventricle: The cavity size was normal. Wall thickness was   increased in a pattern of mild LVH. Systolic function was normal.   The estimated ejection fraction was in the range of 55% to 60%.   Wall motion was normal; there were no regional wall motion   abnormalities. Features are consistent  with a pseudonormal left   ventricular filling pattern, with concomitant abnormal relaxation   and increased filling pressure (grade 2 diastolic dysfunction). - Aortic valve: Trileaflet; moderately calcified leaflets.   Sclerosis without stenosis. - Mitral valve: Moderately calcified annulus. Mildly calcified   leaflets . There was trivial regurgitation. - Left atrium: The atrium was moderately dilated. - Right ventricle: The cavity size was mildly dilated. Pacer wire   or catheter noted in right ventricle. Systolic function was   normal. - Right atrium: The atrium was moderately dilated. - Tricuspid valve: Peak RV-RA gradient (S): 26 mm Hg. - Pulmonary arteries: PA peak pressure: 29 mm Hg (S). - Inferior vena cava: The vessel was normal in size. The   respirophasic diameter changes were in the normal range (>= 50%),   consistent with normal central venous pressure.   ASSESSMENT AND PLAN:  1.  Atrial fibrillation/flutter: Has symptoms of fatigue and shortness of breath. Has been compliant with her Eliquis for anticoagulation. We'll plan for cardioversion. Risks  and benefits discussed. She understands the risks and has agreed to the procedure. We'll also start her on 100 mg of flecainide twice a day for rhythm control.  2. Pacemaker: Stable leads, interrogation showed atrial flutter. No changes today.  3. Hypertension: Well-controlled, no changes made today.  4. Diastolic heart failure: Volume has been stable. No changes at this time.    Current medicines are reviewed at length with the patient today.   The patient does not have concerns regarding her medicines.  The following changes were made today:  Flecainide  Labs/ tests ordered today include:  No orders of the defined types were placed in this encounter.    Disposition:   FU with Arsal Tappan 3 months  Signed, Damir Leung Meredith Leeds, MD  04/24/2017 2:54 PM     [address] [place] [address] [phone] (office) [phone] (fax)

## 2017-04-28 ENCOUNTER — Telehealth: Payer: Self-pay | Admitting: Cardiology

## 2017-04-28 NOTE — Telephone Encounter (Signed)
Patient called with complaints of low BP 87/52. Had a DCCV yesterday. Recently increased metoprolol dose to 75 mg bid. Pulse now 60. Reports fever and chills earlier today- took Tylenol and symptoms resolved. Does not have cough, dysuria. No dizziness. Recommend she hold metoprolol tonight. Resume tomorrow at lower dose of 50 mg bid. Continue to monitor BP and temperature.  Firman Petrow Martinique MD, [facility]

## 2017-04-28 NOTE — Telephone Encounter (Signed)
Erroneous encounter  Peter Martinique MD, [facility]

## 2017-04-30 ENCOUNTER — Telehealth: Payer: Self-pay | Admitting: Cardiology

## 2017-04-30 ENCOUNTER — Encounter (HOSPITAL_COMMUNITY): Payer: Self-pay | Admitting: Cardiovascular Disease

## 2017-04-30 MED ORDER — METOPROLOL TARTRATE 50 MG PO TABS: 50.0000 mg | ORAL_TABLET | Freq: Two times a day (BID) | ORAL | 3 refills | Status: DC

## 2017-04-30 MED ORDER — DRONEDARONE HCL 400 MG PO TABS: 400.0000 mg | ORAL_TABLET | Freq: Two times a day (BID) | ORAL | 11 refills | Status: DC

## 2017-04-30 NOTE — Telephone Encounter (Signed)
Reviewed patient complaints with Dr. Curt Bears. He advised patient d/c flecainide and start multaq 400 mg bid on Wednesday 6/13. He would like patient to come in 2 weeks later for an ekg. He advised she continue metoprolol 50 mg bid. I reviewed this information with patient. She is concerned that her HR is elevated currently and I advised her to take her metoprolol and make sure her nutrition is good since she vomited on Saturday and has had decreased appetite. I advised her to come in for ekg on Wed. June 27 at 11am. She asked about GXT which is scheduled for June 19 for f/u on flecainide. I advised that I am cancelling that appointment.  I have forwarded the information on the nurse visit to Trinidad Curet, RN, Dr. Macky Lower nurse for confirmation and will notify patient if date/time needs to change. I advised patient to call back with questions or concerns prior to that appointment. She verbalized understanding and agreement and thanked me for the call.

## 2017-04-30 NOTE — Telephone Encounter (Signed)
New message   Pt calling stating she had cardioversion on Friday Morning and her pulse is high this morning.  Patient c/o Palpitations:  High priority if patient c/o lightheadedness and shortness of breath.  1. How long have you been having palpitations? Just noticed this morning.   2. Are you currently experiencing lightheadedness and shortness of breath? Lightheadedness, dizziness, sob when she gets up   3. Have you checked your BP and heart rate? (document readings) p-96 two hours later p-102  4. Are you experiencing any other symptoms? No

## 2017-04-30 NOTE — Telephone Encounter (Signed)
New message   Pt verbalized that the muiltac 400mg  is 400.00 she needs something else to take

## 2017-04-30 NOTE — Telephone Encounter (Signed)
Received call directly from operator.  Patient c/o dizziness with movement and fast HR. She reports BP today is 115/81 mmHg and pulse 102 bpm. She had cardioversion on Friday and was advised to increase metoprolol to 75 mg bid and to start flecainide 100 mg bid after procedure. She called the on-call provider this weekend with hypotension. Dr. Martinique advised her to hold metoprolol that night and to resume 50 mg bid in the place of 75 mg on Sunday. She states she is nauseated and has decreased appetite. She reports she vomited Saturday night after taking flecainide. She reports she has not taken any medications yet this morning. She complains of SOB and feeling poor; states she feels that she is back in atrial flutter. I advised that I will review with Dr. Curt Bears who is in the office today and call her back with his advice.

## 2017-05-01 ENCOUNTER — Telehealth: Payer: Self-pay

## 2017-05-01 MED ORDER — AMIODARONE HCL 200 MG PO TABS: ORAL_TABLET | ORAL | 1 refills | Status: DC

## 2017-05-01 NOTE — Telephone Encounter (Signed)
Called, spoke with pt. Informed Dr. Curt Bears recommended pt to start Amiodarone 1 tablet (200 mg) twice daily for 1 month, then decrease to 1 tablet (200 mg) daily. Pt will need repeat cardioversion in 3 weeks. Pt stated she would like to schedule on 05/24/17. Informed I will contcat the hospital for scheduling and call pt back to confirm date/time. Pt verbalized understanding.

## 2017-05-01 NOTE — Telephone Encounter (Signed)
F/U call:  Patient c/o Palpitations:  High priority if patient c/o lightheadedness and shortness of breath.  1. How long have you been having palpitations? Since yesterday   2. Are you currently experiencing lightheadedness and shortness of breath? Yes when patient gets up to move around  3. Have you checked your BP and heart rate? (document readings) over 110 , 113 this morning   4. Are you experiencing any other symptoms? Dizziness (usual with AFIB problem)    Patient also calling in regards to Multaq  medication, states that it costs too much and needs an alternative.

## 2017-05-01 NOTE — Telephone Encounter (Signed)
Received incoming call. Pt stated she had cardioversion on 04/27/17. Pt stated it only lasted for a couple of days. Pt denied CP, but stated she has been experiencing SOB for the past 2 weeks. Pt stated her BP today is 100/84, HR 113. Pt stated her HR is irregular, at times. Pt stated the Multaq Rx is $200. Pt stated she would like a replacement medication, because she cannot afford it.. Pt stated she cannot take Flecainide because it makes her sick.

## 2017-05-01 NOTE — Telephone Encounter (Signed)
Called, spoke with pt. Informed Dr. Curt Bears stated to d/c Multaq. Start Amiodarone 200 mg twice daily, then decrease to 200 mg daily. Pt requested med to be sent to [facility] in [place]. Dr. Curt Bears recommended to schedule repeat cardioversion in 3 weeks. Pt tstated she will call our office if she feels she is back in rhythm prior to cardioversion.   Scheduled pt for cardioversion on 05/24/17, arriving at 11:30 AM to the [facility] Entrance at [facility]. Pt scheduled to come in the [facility] office on 05/16/17 for lab work. Reviewed pre- procedure instructions - nothing to eat or drink after midnight, except for meds. Informed to make sure to take Eliquis the morning of procedure. Pt may take meds with a sip or water the morning of procedure - except HOLD Lasix. Pt must have a responsible person to drive her home. Informed pt will need a f/u ov with Dr. Curt Bears, likely 4-6 weeks after cardioversion. Informed someone will call pt to schedule. Pt verbalized understanding.

## 2017-05-04 ENCOUNTER — Ambulatory Visit (HOSPITAL_COMMUNITY)
Admission: RE | Admit: 2017-05-04 | Discharge: 2017-05-04 | Disposition: A | Discharge status: Home / Self Care | Payer: Medicare Other | Source: Ambulatory Visit | Attending: Nurse Practitioner | Admitting: Nurse Practitioner

## 2017-05-04 VITALS — BP 98/66 | HR 87 | Ht 64.0 in | Wt 149.4 lb

## 2017-05-04 DIAGNOSIS — G4730 Sleep apnea, unspecified: Secondary | ICD-10-CM | POA: Diagnosis not present

## 2017-05-04 DIAGNOSIS — Z833 Family history of diabetes mellitus: Secondary | ICD-10-CM | POA: Diagnosis not present

## 2017-05-04 DIAGNOSIS — Z881 Allergy status to other antibiotic agents status: Secondary | ICD-10-CM | POA: Insufficient documentation

## 2017-05-04 DIAGNOSIS — Z9889 Other specified postprocedural states: Secondary | ICD-10-CM | POA: Diagnosis not present

## 2017-05-04 DIAGNOSIS — Z8249 Family history of ischemic heart disease and other diseases of the circulatory system: Secondary | ICD-10-CM | POA: Diagnosis not present

## 2017-05-04 DIAGNOSIS — I481 Persistent atrial fibrillation: Secondary | ICD-10-CM

## 2017-05-04 DIAGNOSIS — I2510 Atherosclerotic heart disease of native coronary artery without angina pectoris: Secondary | ICD-10-CM | POA: Insufficient documentation

## 2017-05-04 DIAGNOSIS — Z9884 Bariatric surgery status: Secondary | ICD-10-CM | POA: Insufficient documentation

## 2017-05-04 DIAGNOSIS — Z836 Family history of other diseases of the respiratory system: Secondary | ICD-10-CM | POA: Insufficient documentation

## 2017-05-04 DIAGNOSIS — E559 Vitamin D deficiency, unspecified: Secondary | ICD-10-CM | POA: Diagnosis not present

## 2017-05-04 DIAGNOSIS — Z7902 Long term (current) use of antithrombotics/antiplatelets: Secondary | ICD-10-CM | POA: Diagnosis not present

## 2017-05-04 DIAGNOSIS — Z8052 Family history of malignant neoplasm of bladder: Secondary | ICD-10-CM | POA: Diagnosis not present

## 2017-05-04 DIAGNOSIS — E785 Hyperlipidemia, unspecified: Secondary | ICD-10-CM | POA: Diagnosis not present

## 2017-05-04 DIAGNOSIS — H8110 Benign paroxysmal vertigo, unspecified ear: Secondary | ICD-10-CM | POA: Diagnosis not present

## 2017-05-04 DIAGNOSIS — Z9109 Other allergy status, other than to drugs and biological substances: Secondary | ICD-10-CM | POA: Insufficient documentation

## 2017-05-04 DIAGNOSIS — I4891 Unspecified atrial fibrillation: Secondary | ICD-10-CM | POA: Insufficient documentation

## 2017-05-04 DIAGNOSIS — Z87891 Personal history of nicotine dependence: Secondary | ICD-10-CM | POA: Insufficient documentation

## 2017-05-04 DIAGNOSIS — I252 Old myocardial infarction: Secondary | ICD-10-CM | POA: Insufficient documentation

## 2017-05-04 DIAGNOSIS — Z841 Family history of disorders of kidney and ureter: Secondary | ICD-10-CM | POA: Diagnosis not present

## 2017-05-04 DIAGNOSIS — I4819 Other persistent atrial fibrillation: Secondary | ICD-10-CM

## 2017-05-04 DIAGNOSIS — G2581 Restless legs syndrome: Secondary | ICD-10-CM | POA: Diagnosis not present

## 2017-05-04 DIAGNOSIS — Z823 Family history of stroke: Secondary | ICD-10-CM | POA: Diagnosis not present

## 2017-05-04 DIAGNOSIS — Z880 Allergy status to penicillin: Secondary | ICD-10-CM | POA: Insufficient documentation

## 2017-05-04 DIAGNOSIS — G47419 Narcolepsy without cataplexy: Secondary | ICD-10-CM | POA: Insufficient documentation

## 2017-05-04 DIAGNOSIS — Z7901 Long term (current) use of anticoagulants: Secondary | ICD-10-CM | POA: Insufficient documentation

## 2017-05-04 NOTE — Telephone Encounter (Signed)
Follow up   Pt c/o medication issue:  1. Name of Medication: Amiodarone    2. How are you currently taking this medication (dosage and times per day)? 200mg  2x day  3. Are you having a reaction (difficulty breathing--STAT)? no  4. What is your medication issue? Dizzy and weak

## 2017-05-04 NOTE — Progress Notes (Signed)
Primary Care Physician: [name], DO Referring Physician: Dr. Curt Bears via Trinidad Curet, RN   Donna [name] is a 76 y.o. female with a h/o, PPM, persistent afib, pt of Dr. Curt Bears that just started amiodarone load 6/12, after failing flecainide. I was asked to see pt because she called to office this am feeling weak and lightheaded. Pt states that she has felt this way ever since she slipped into persistent afib. In the past, she would have episodes of afib but would self convert. Upon questioning, she does not feel any worse on amiodarone, she was hoping she would feel better. She is in afib at 87 bpm . States at rest at home, HR is in the 80's but increases to 120's with activity. Fluid status is stable.   Today, she denies symptoms of palpitations, chest pain, shortness of breath, orthopnea, PND, lower extremity edema, dizziness, presyncope, syncope, or neurologic sequela. The patient is tolerating medications without difficulties and is otherwise without complaint today.   Past Medical History:  Diagnosis Date  . Atrial fibrillation ([place]) 10/17/2016   On Metoprolol, Eliquis  . Benign paroxysmal positional vertigo 10/18/2016  . CAD (coronary atherosclerotic disease) 10/17/2016   Hx MI  . Cystitis 10/18/2016  . Dysuria 10/18/2016  . History of anemia 10/18/2016  . History of gastric bypass 10/17/2016  . Hyperlipidemia 10/17/2016  . Lower extremity edema 10/18/2016  . Restless leg syndrome 10/17/2016  . Sleep disorder 10/17/2016   Modafinil for sleep apnea and narcolepsy  . Tobacco dependence 10/17/2016  . Urinary frequency 10/18/2016  . Vitamin D deficiency 10/18/2016   Past Surgical History:  Procedure Laterality Date  . CARDIAC CATHETERIZATION    . CARDIOVERSION N/A 04/27/2017   Procedure: CARDIOVERSION;  Surgeon: [name], MD;  Location: [facility] ENDOSCOPY;  Service: Cardiovascular;  Laterality: N/A;    Current Outpatient Prescriptions  Medication Sig  Dispense Refill  . amiodarone (PACERONE) 200 MG tablet Take 1 tablet (200 mg) twice daily for 1 month. After that, decrease to 1 tablet (200 mg) daily 90 tablet 1  . atorvastatin (LIPITOR) 10 MG tablet Take 1 tablet (10 mg total) by mouth daily. 90 tablet 3  . Cholecalciferol (VITAMIN D3) 5000 units CAPS Take 5,000 Units by mouth daily.     [name] co-enzyme Q-10 30 MG capsule Take 30 mg by mouth 2 (two) times daily.     [name] ELIQUIS 5 MG TABS tablet Take 1 tablet (5 mg total) by mouth 2 (two) times daily. 180 tablet 3  . Ferrous Sulfate (IRON) 325 (65 Fe) MG TABS Take 325 mg by mouth daily with breakfast.     . folic acid (FOLVITE) 193 MCG tablet Take 800 mcg by mouth daily.    . furosemide (LASIX) 20 MG tablet Take 1 tablet (20 mg total) by mouth as directed. Take as directed (Patient taking differently: Take 20 mg by mouth daily as needed for fluid or edema. ) 20 tablet 0  . metoprolol tartrate (LOPRESSOR) 50 MG tablet Take 1 tablet (50 mg total) by mouth 2 (two) times daily. 135 tablet 3  . modafinil (PROVIGIL) 200 MG tablet Take 1 tablet (200 mg total) by mouth 2 (two) times daily. 60 tablet 5  . nitrofurantoin (MACRODANTIN) 100 MG capsule Take 1 capsule (100 mg total) by mouth at bedtime. To prevent UTI 90 capsule 1  . pramipexole (MIRAPEX) 0.25 MG tablet Take 1 tablet (0.25 mg total) by mouth 2 (two) times daily. 180 tablet 3  . clobetasol  ointment (TEMOVATE) 3.29 % Apply 1 application topically 2 (two) times daily. 30 g 0  . denosumab (PROLIA) 60 MG/ML SOLN injection Inject 60 mg into the skin every 6 (six) months. Administer in upper arm, thigh, or abdomen    . gentamicin cream (GARAMYCIN) 0.1 % Apply 1 application topically 3 (three) times daily. (Patient taking differently: Apply 1 application topically 3 (three) times daily. TO AFFECTED AREA(S) ON LEG(S)) 30 g 1  . loperamide (IMODIUM) 2 MG capsule Take 2 mg by mouth See admin instructions. After AM bowel movement (as needed for leakage)    .  meclizine (ANTIVERT) 12.5 MG tablet Take 12.5 mg by mouth 2 (two) times daily.    . ondansetron (ZOFRAN-ODT) 8 MG disintegrating tablet Take 1 tablet (8 mg total) by mouth every 8 (eight) hours as needed for nausea. 20 tablet 3   No current facility-administered medications for this encounter.     Allergies  Allergen Reactions  . Vancomycin Anaphylaxis  . Penicillins Hives    Has patient had a PCN reaction causing immediate rash, facial/tongue/throat swelling, SOB or lightheadedness with hypotension: Yes Has patient had a PCN reaction causing severe rash involving mucus membranes or skin necrosis: No Has patient had a PCN reaction that required hospitalization: No Has patient had a PCN reaction occurring within the last 10 years: No If all of the above answers are "NO", then may proceed with Cephalosporin use.   . Tape Rash    Social History   Social History  . Marital status: Widowed    Spouse name: N/A  . Number of children: N/A  . Years of education: N/A   Occupational History  . Not on file.   Social History Main Topics  . Smoking status: Former Smoker    Packs/day: 1.00    Years: 55.00  . Smokeless tobacco: Never Used  . Alcohol use No  . Drug use: No  . Sexual activity: Not on file   Other Topics Concern  . Not on file   Social History Narrative  . No narrative on file    Family History  Problem Relation Age of Onset  . Emphysema Mother   . Heart disease Father   . Diabetes Father   . Emphysema Father   . Hypertension Sister   . Bladder Cancer Brother   . Hypertension Brother   . Heart attack Maternal Grandmother   . Kidney failure Paternal Grandmother   . Stroke Paternal Grandfather   . Hypertension Brother     ROS- All systems are reviewed and negative except as per the HPI above  Physical Exam: Vitals:   05/04/17 1116  BP: 98/66  Pulse: 87  Weight: 149 lb 6.4 oz (67.8 kg)  Height: 5\' 4"  (1.626 m)   Wt Readings from Last 3 Encounters:    05/04/17 149 lb 6.4 oz (67.8 kg)  04/27/17 148 lb (67.1 kg)  04/24/17 148 lb (67.1 kg)    Labs: Lab Results  Component Value Date   NA 139 04/20/2017   K 4.8 04/20/2017   CL 104 04/20/2017   CO2 27 04/20/2017   GLUCOSE 94 04/20/2017   BUN 15 04/20/2017   CREATININE 0.69 04/20/2017   CALCIUM 8.6 04/20/2017   Lab Results  Component Value Date   INR 1.03 04/17/2017   Lab Results  Component Value Date   CHOL 214 (H) 01/18/2017   HDL 109 01/18/2017   LDLCALC 89 01/18/2017   TRIG 81 01/18/2017  GEN- The patient is well appearing, alert and oriented x 3 today.   Head- normocephalic, atraumatic Eyes-  Sclera clear, conjunctiva pink Ears- hearing intact Oropharynx- clear Neck- supple, no JVP Lymph- no cervical lymphadenopathy Lungs- Clear to ausculation bilaterally, normal work of breathing Heart- Irregular rate and rhythm, no murmurs, rubs or gallops, PMI not laterally displaced GI- soft, NT, ND, + BS Extremities- no clubbing, cyanosis, or edema MS- no significant deformity or atrophy Skin- no rash or lesion Psych- euthymic mood, full affect Neuro- strength and sensation are intact  EKG- afib at 87 bpm, qrs int 122 ms, qt c 481 ms Epic records reviewed   Assessment and Plan: 1. Persistent symptomatic afib General education re afib and management and what to expect re loading with amiodarone and cardioversion scheduled early July. She is continue loading with amiodarone 200 mg bid  Continue metoprolol 50 mg bid Continue eliquis 5 mg bid for chadsvasc score of  at least 3, reminded no missed doses States fluid status is stable, reminded to call office if weight increases 3-5 lbs, orthopnea, PND or dyspnea worsens, or pedal edema noted  Butch Penny C. Macee Venables, [facility] [address] [place], [place] [address] [phone]

## 2017-05-04 NOTE — Telephone Encounter (Signed)
Reports lightheadedness, dizziness, and no energy.  HRs avg 110s-120s.   BP this morning 114/96 Started Amiodarone on Wednesday, 6/13 at 200 mg BID x 1 mo. Pt apologizes for all the calls recently but she has never had this many issues trying to get AFib "under control".  Reassured her that it was ok and we were here to help. Advised pt that it would be best to get an EKG to determine rhythm and then try and see if there are other ways to help w/ HR control  (she is currently taking Lopressor 50 mg BID, and we cannot increase d/t hypotension). Instructions/directions/number to AFib clinic given to pt. Patient verbalized understanding and agreeable to plan.

## 2017-05-14 ENCOUNTER — Telehealth: Payer: Self-pay | Admitting: Cardiology

## 2017-05-14 DIAGNOSIS — I480 Paroxysmal atrial fibrillation: Secondary | ICD-10-CM

## 2017-05-14 DIAGNOSIS — Z01812 Encounter for preprocedural laboratory examination: Secondary | ICD-10-CM

## 2017-05-14 NOTE — Telephone Encounter (Signed)
Informed patient that she needs to have her lab work done on Wednesday, according to letter on 05/01/17. Ordered lab work and schedule time for patient to come in on 05/16/17. Will forward to Dr. Macky Lower nurse so she is aware.

## 2017-05-14 NOTE — Telephone Encounter (Signed)
°  New Prob  States she started on amiodarone (PACERONE) 200 MG tablet 2 weeks ago. States pulse has been runs in 80's since late last week. Calling to verify if she will still be scheduled for cardioversion and blood work. Please call.

## 2017-05-16 ENCOUNTER — Other Ambulatory Visit: Payer: Medicare Other | Admitting: *Deleted

## 2017-05-16 DIAGNOSIS — I480 Paroxysmal atrial fibrillation: Secondary | ICD-10-CM | POA: Diagnosis not present

## 2017-05-16 DIAGNOSIS — Z01812 Encounter for preprocedural laboratory examination: Secondary | ICD-10-CM | POA: Diagnosis not present

## 2017-05-16 LAB — BASIC METABOLIC PANEL
BUN/Creatinine Ratio: 20 (ref 12–28)
BUN: 15 mg/dL (ref 8–27)
CALCIUM: 8.7 mg/dL (ref 8.7–10.3)
CO2: 25 mmol/L (ref 20–29)
CREATININE: 0.76 mg/dL (ref 0.57–1.00)
Chloride: 104 mmol/L (ref 96–106)
GFR, EST AFRICAN AMERICAN: 90 mL/min/{1.73_m2} (ref >59)
GFR, EST NON AFRICAN AMERICAN: 78 mL/min/{1.73_m2} (ref >59)
Glucose: 65 mg/dL (ref 65–99)
Potassium: 4.7 mmol/L (ref 3.5–5.2)
Sodium: 141 mmol/L (ref 134–144)

## 2017-05-16 LAB — CBC WITH DIFFERENTIAL/PLATELET
BASOS: 0 %
Basophils Absolute: 0 10*3/uL (ref 0.0–0.2)
EOS (ABSOLUTE): 0.1 10*3/uL (ref 0.0–0.4)
EOS: 2 %
HEMATOCRIT: 39.4 % (ref 34.0–46.6)
HEMOGLOBIN: 13.1 g/dL (ref 11.1–15.9)
IMMATURE GRANS (ABS): 0 10*3/uL (ref 0.0–0.1)
IMMATURE GRANULOCYTES: 0 %
LYMPHS: 27 %
Lymphocytes Absolute: 1.5 10*3/uL (ref 0.7–3.1)
MCH: 29.2 pg (ref 26.6–33.0)
MCHC: 33.2 g/dL (ref 31.5–35.7)
MCV: 88 fL (ref 79–97)
MONOCYTES: 10 %
MONOS ABS: 0.5 10*3/uL (ref 0.1–0.9)
NEUTROS PCT: 61 %
Neutrophils Absolute: 3.3 10*3/uL (ref 1.4–7.0)
Platelets: 281 10*3/uL (ref 150–379)
RBC: 4.48 x10E6/uL (ref 3.77–5.28)
RDW: 13.7 % (ref 12.3–15.4)
WBC: 5.4 10*3/uL (ref 3.4–10.8)

## 2017-05-16 LAB — PROTIME-INR
INR: 1.1 (ref 0.8–1.2)
PROTHROMBIN TIME: 11.4 s (ref 9.1–12.0)

## 2017-05-17 NOTE — Telephone Encounter (Signed)
Follow up   Pt verbalized that she is calling for the rn she has a procedure coming up and   she wants to be sure of the medications that she can take before

## 2017-05-17 NOTE — Telephone Encounter (Signed)
Pt reports taking Amiodarone w/ food secondary to nausea.  She is wondering how she will take it the morning of her DCCV b/c she can't eat.  Advised pt to hold medication morning of procedure if she is only able to take with food.  Ensured patient this would not cause any issues is she did not take the morning dose. Pt thanks me for calling and explaining.

## 2017-05-22 ENCOUNTER — Other Ambulatory Visit (HOSPITAL_COMMUNITY): Payer: Self-pay | Admitting: *Deleted

## 2017-05-24 ENCOUNTER — Ambulatory Visit (HOSPITAL_COMMUNITY)
Admission: RE | Admit: 2017-05-24 | Discharge: 2017-05-24 | Disposition: A | Discharge status: Home / Self Care | Payer: Medicare Other | Source: Ambulatory Visit | Attending: Cardiology | Admitting: Cardiology

## 2017-05-24 ENCOUNTER — Ambulatory Visit (HOSPITAL_COMMUNITY): Payer: Medicare Other | Admitting: Certified Registered Nurse Anesthetist

## 2017-05-24 ENCOUNTER — Encounter (HOSPITAL_COMMUNITY): Payer: Self-pay

## 2017-05-24 ENCOUNTER — Encounter (HOSPITAL_COMMUNITY)
Admission: RE | Disposition: A | Discharge status: Home / Self Care | Payer: Self-pay | Source: Ambulatory Visit | Attending: Cardiology

## 2017-05-24 DIAGNOSIS — I4892 Unspecified atrial flutter: Secondary | ICD-10-CM | POA: Diagnosis not present

## 2017-05-24 DIAGNOSIS — Z7901 Long term (current) use of anticoagulants: Secondary | ICD-10-CM | POA: Diagnosis not present

## 2017-05-24 DIAGNOSIS — I2510 Atherosclerotic heart disease of native coronary artery without angina pectoris: Secondary | ICD-10-CM | POA: Diagnosis not present

## 2017-05-24 DIAGNOSIS — I252 Old myocardial infarction: Secondary | ICD-10-CM | POA: Insufficient documentation

## 2017-05-24 DIAGNOSIS — Z87891 Personal history of nicotine dependence: Secondary | ICD-10-CM | POA: Diagnosis not present

## 2017-05-24 DIAGNOSIS — D649 Anemia, unspecified: Secondary | ICD-10-CM | POA: Diagnosis not present

## 2017-05-24 DIAGNOSIS — I4891 Unspecified atrial fibrillation: Secondary | ICD-10-CM | POA: Diagnosis not present

## 2017-05-24 DIAGNOSIS — Z79899 Other long term (current) drug therapy: Secondary | ICD-10-CM | POA: Insufficient documentation

## 2017-05-24 DIAGNOSIS — I5032 Chronic diastolic (congestive) heart failure: Secondary | ICD-10-CM | POA: Diagnosis not present

## 2017-05-24 DIAGNOSIS — I480 Paroxysmal atrial fibrillation: Secondary | ICD-10-CM | POA: Diagnosis not present

## 2017-05-24 HISTORY — PX: CARDIOVERSION: SHX1299

## 2017-05-24 SURGERY — CARDIOVERSION
Anesthesia: General

## 2017-05-24 MED ORDER — SODIUM CHLORIDE 0.9 % IV SOLN
INTRAVENOUS | Status: DC
  Administered 2017-05-24: 500 mL via INTRAVENOUS

## 2017-05-24 MED ORDER — LIDOCAINE 2% (20 MG/ML) 5 ML SYRINGE
INTRAMUSCULAR | Status: DC | PRN
  Administered 2017-05-24: 100 mg via INTRAVENOUS

## 2017-05-24 MED ORDER — PROPOFOL 10 MG/ML IV BOLUS
INTRAVENOUS | Status: DC | PRN
  Administered 2017-05-24: 70 mg via INTRAVENOUS

## 2017-05-24 MED ORDER — SODIUM CHLORIDE 0.9 % IV SOLN
INTRAVENOUS | Status: DC | PRN
  Administered 2017-05-24: 12:00:00 via INTRAVENOUS

## 2017-05-24 NOTE — Anesthesia Preprocedure Evaluation (Signed)
Anesthesia Evaluation  Patient identified by MRN, date of birth, ID band Patient awake    Reviewed: Allergy & Precautions, NPO status , Patient's Chart, lab work & pertinent test results  Airway Mallampati: I  TM Distance: >3 FB Neck ROM: Full    Dental   Pulmonary former smoker,    Pulmonary exam normal        Cardiovascular + CAD  Normal cardiovascular exam+ pacemaker      Neuro/Psych    GI/Hepatic   Endo/Other    Renal/GU      Musculoskeletal   Abdominal   Peds  Hematology   Anesthesia Other Findings   Reproductive/Obstetrics                             Anesthesia Physical Anesthesia Plan  ASA: III  Anesthesia Plan: General   Post-op Pain Management:    Induction: Intravenous  PONV Risk Score and Plan: 3 and Propofol and Treatment may vary due to age or medical condition  Airway Management Planned:   Additional Equipment:   Intra-op Plan:   Post-operative Plan:   Informed Consent: I have reviewed the patients History and Physical, chart, labs and discussed the procedure including the risks, benefits and alternatives for the proposed anesthesia with the patient or authorized representative who has indicated his/her understanding and acceptance.     Plan Discussed with: Surgeon and CRNA  Anesthesia Plan Comments:         Anesthesia Quick Evaluation

## 2017-05-24 NOTE — Transfer of Care (Signed)
Immediate Anesthesia Transfer of Care Note  Patient: Donna [name]  Procedure(s) Performed: Procedure(s): CARDIOVERSION (N/A)  Patient Location: Endoscopy Unit  Anesthesia Type:General  Level of Consciousness: awake, alert  and oriented  Airway & Oxygen Therapy: Patient Spontanous Breathing  Post-op Assessment: Report given to RN, Post -op Vital signs reviewed and stable and Patient moving all extremities X 4  Post vital signs: Reviewed and stable  Last Vitals:  Vitals:   05/24/17 1141  BP: 113/77  Pulse: 86  Resp: 16  Temp: 36.6 C    Last Pain:  Vitals:   05/24/17 1141  TempSrc: Oral         Complications: No apparent anesthesia complications

## 2017-05-24 NOTE — Anesthesia Postprocedure Evaluation (Signed)
Anesthesia Post Note  Patient: Calleen Alvis  Procedure(s) Performed: Procedure(s) (LRB): CARDIOVERSION (N/A)     Patient location during evaluation: PACU Anesthesia Type: General Level of consciousness: awake and alert Pain management: pain level controlled Vital Signs Assessment: post-procedure vital signs reviewed and stable Respiratory status: spontaneous breathing, nonlabored ventilation, respiratory function stable and patient connected to nasal cannula oxygen Cardiovascular status: blood pressure returned to baseline and stable Postop Assessment: no signs of nausea or vomiting Anesthetic complications: no    Last Vitals:  Vitals:   05/24/17 1328 05/24/17 1350  BP: 99/64 (!) 92/57  Pulse: 61 (!) 59  Resp: 18 15  Temp: 36.4 C     Last Pain:  Vitals:   05/24/17 1328  TempSrc: Oral                 Shamya Macfadden DAVID

## 2017-05-24 NOTE — Procedures (Addendum)
Electrical Cardioversion Procedure Note Kaylon Laroche [id number] 30-Sep-1941  Procedure: Electrical Cardioversion Indications:  Atrial Fibrillation  Procedure Details Consent: Risks of procedure as well as the alternatives and risks of each were explained to the (patient/caregiver).  Consent for procedure obtained. Time Out: Verified patient identification, verified procedure, site/side was marked, verified correct patient position, special equipment/implants available, medications/allergies/relevent history reviewed, required imaging and test results available.  Performed  Patient placed on cardiac monitor, pulse oximetry, supplemental oxygen as necessary.  Sedation given: Per anesthesiology Pacer pads placed anterior and posterior chest.  Cardioverted 1 time(s).  Cardioverted at [place].  Evaluation Findings: Post procedure EKG shows: NSR Complications: None Patient did tolerate procedure well.   Loralie Champagne 05/24/2017, 1:17 PM

## 2017-05-24 NOTE — H&P (View-Only) (Signed)
Primary Care Physician: [name], DO Referring Physician: Dr. Curt Bears via Trinidad Curet, RN   Donna [name] is a 76 y.o. female with a h/o, PPM, persistent afib, pt of Dr. Curt Bears that just started amiodarone load 6/12, after failing flecainide. I was asked to see pt because she called to office this am feeling weak and lightheaded. Pt states that she has felt this way ever since she slipped into persistent afib. In the past, she would have episodes of afib but would self convert. Upon questioning, she does not feel any worse on amiodarone, she was hoping she would feel better. She is in afib at 87 bpm . States at rest at home, HR is in the 80's but increases to 120's with activity. Fluid status is stable.   Today, she denies symptoms of palpitations, chest pain, shortness of breath, orthopnea, PND, lower extremity edema, dizziness, presyncope, syncope, or neurologic sequela. The patient is tolerating medications without difficulties and is otherwise without complaint today.   Past Medical History:  Diagnosis Date  . Atrial fibrillation ([place]) 10/17/2016   On Metoprolol, Eliquis  . Benign paroxysmal positional vertigo 10/18/2016  . CAD (coronary atherosclerotic disease) 10/17/2016   Hx MI  . Cystitis 10/18/2016  . Dysuria 10/18/2016  . History of anemia 10/18/2016  . History of gastric bypass 10/17/2016  . Hyperlipidemia 10/17/2016  . Lower extremity edema 10/18/2016  . Restless leg syndrome 10/17/2016  . Sleep disorder 10/17/2016   Modafinil for sleep apnea and narcolepsy  . Tobacco dependence 10/17/2016  . Urinary frequency 10/18/2016  . Vitamin D deficiency 10/18/2016   Past Surgical History:  Procedure Laterality Date  . CARDIAC CATHETERIZATION    . CARDIOVERSION N/A 04/27/2017   Procedure: CARDIOVERSION;  Surgeon: [name], MD;  Location: [facility] ENDOSCOPY;  Service: Cardiovascular;  Laterality: N/A;    Current Outpatient Prescriptions  Medication Sig  Dispense Refill  . amiodarone (PACERONE) 200 MG tablet Take 1 tablet (200 mg) twice daily for 1 month. After that, decrease to 1 tablet (200 mg) daily 90 tablet 1  . atorvastatin (LIPITOR) 10 MG tablet Take 1 tablet (10 mg total) by mouth daily. 90 tablet 3  . Cholecalciferol (VITAMIN D3) 5000 units CAPS Take 5,000 Units by mouth daily.     [name] co-enzyme Q-10 30 MG capsule Take 30 mg by mouth 2 (two) times daily.     [name] ELIQUIS 5 MG TABS tablet Take 1 tablet (5 mg total) by mouth 2 (two) times daily. 180 tablet 3  . Ferrous Sulfate (IRON) 325 (65 Fe) MG TABS Take 325 mg by mouth daily with breakfast.     . folic acid (FOLVITE) 409 MCG tablet Take 800 mcg by mouth daily.    . furosemide (LASIX) 20 MG tablet Take 1 tablet (20 mg total) by mouth as directed. Take as directed (Patient taking differently: Take 20 mg by mouth daily as needed for fluid or edema. ) 20 tablet 0  . metoprolol tartrate (LOPRESSOR) 50 MG tablet Take 1 tablet (50 mg total) by mouth 2 (two) times daily. 135 tablet 3  . modafinil (PROVIGIL) 200 MG tablet Take 1 tablet (200 mg total) by mouth 2 (two) times daily. 60 tablet 5  . nitrofurantoin (MACRODANTIN) 100 MG capsule Take 1 capsule (100 mg total) by mouth at bedtime. To prevent UTI 90 capsule 1  . pramipexole (MIRAPEX) 0.25 MG tablet Take 1 tablet (0.25 mg total) by mouth 2 (two) times daily. 180 tablet 3  . clobetasol  ointment (TEMOVATE) 0.93 % Apply 1 application topically 2 (two) times daily. 30 g 0  . denosumab (PROLIA) 60 MG/ML SOLN injection Inject 60 mg into the skin every 6 (six) months. Administer in upper arm, thigh, or abdomen    . gentamicin cream (GARAMYCIN) 0.1 % Apply 1 application topically 3 (three) times daily. (Patient taking differently: Apply 1 application topically 3 (three) times daily. TO AFFECTED AREA(S) ON LEG(S)) 30 g 1  . loperamide (IMODIUM) 2 MG capsule Take 2 mg by mouth See admin instructions. After AM bowel movement (as needed for leakage)    .  meclizine (ANTIVERT) 12.5 MG tablet Take 12.5 mg by mouth 2 (two) times daily.    . ondansetron (ZOFRAN-ODT) 8 MG disintegrating tablet Take 1 tablet (8 mg total) by mouth every 8 (eight) hours as needed for nausea. 20 tablet 3   No current facility-administered medications for this encounter.     Allergies  Allergen Reactions  . Vancomycin Anaphylaxis  . Penicillins Hives    Has patient had a PCN reaction causing immediate rash, facial/tongue/throat swelling, SOB or lightheadedness with hypotension: Yes Has patient had a PCN reaction causing severe rash involving mucus membranes or skin necrosis: No Has patient had a PCN reaction that required hospitalization: No Has patient had a PCN reaction occurring within the last 10 years: No If all of the above answers are "NO", then may proceed with Cephalosporin use.   . Tape Rash    Social History   Social History  . Marital status: Widowed    Spouse name: N/A  . Number of children: N/A  . Years of education: N/A   Occupational History  . Not on file.   Social History Main Topics  . Smoking status: Former Smoker    Packs/day: 1.00    Years: 55.00  . Smokeless tobacco: Never Used  . Alcohol use No  . Drug use: No  . Sexual activity: Not on file   Other Topics Concern  . Not on file   Social History Narrative  . No narrative on file    Family History  Problem Relation Age of Onset  . Emphysema Mother   . Heart disease Father   . Diabetes Father   . Emphysema Father   . Hypertension Sister   . Bladder Cancer Brother   . Hypertension Brother   . Heart attack Maternal Grandmother   . Kidney failure Paternal Grandmother   . Stroke Paternal Grandfather   . Hypertension Brother     ROS- All systems are reviewed and negative except as per the HPI above  Physical Exam: Vitals:   05/04/17 1116  BP: 98/66  Pulse: 87  Weight: 149 lb 6.4 oz (67.8 kg)  Height: 5\' 4"  (1.626 m)   Wt Readings from Last 3 Encounters:    05/04/17 149 lb 6.4 oz (67.8 kg)  04/27/17 148 lb (67.1 kg)  04/24/17 148 lb (67.1 kg)    Labs: Lab Results  Component Value Date   NA 139 04/20/2017   K 4.8 04/20/2017   CL 104 04/20/2017   CO2 27 04/20/2017   GLUCOSE 94 04/20/2017   BUN 15 04/20/2017   CREATININE 0.69 04/20/2017   CALCIUM 8.6 04/20/2017   Lab Results  Component Value Date   INR 1.03 04/17/2017   Lab Results  Component Value Date   CHOL 214 (H) 01/18/2017   HDL 109 01/18/2017   LDLCALC 89 01/18/2017   TRIG 81 01/18/2017  GEN- The patient is well appearing, alert and oriented x 3 today.   Head- normocephalic, atraumatic Eyes-  Sclera clear, conjunctiva pink Ears- hearing intact Oropharynx- clear Neck- supple, no JVP Lymph- no cervical lymphadenopathy Lungs- Clear to ausculation bilaterally, normal work of breathing Heart- Irregular rate and rhythm, no murmurs, rubs or gallops, PMI not laterally displaced GI- soft, NT, ND, + BS Extremities- no clubbing, cyanosis, or edema MS- no significant deformity or atrophy Skin- no rash or lesion Psych- euthymic mood, full affect Neuro- strength and sensation are intact  EKG- afib at 87 bpm, qrs int 122 ms, qt c 481 ms Epic records reviewed   Assessment and Plan: 1. Persistent symptomatic afib General education re afib and management and what to expect re loading with amiodarone and cardioversion scheduled early July. She is continue loading with amiodarone 200 mg bid  Continue metoprolol 50 mg bid Continue eliquis 5 mg bid for chadsvasc score of  at least 3, reminded no missed doses States fluid status is stable, reminded to call office if weight increases 3-5 lbs, orthopnea, PND or dyspnea worsens, or pedal edema noted  Butch Penny C. , [facility] [address] [place],  [address] [phone]

## 2017-05-24 NOTE — Interval H&P Note (Signed)
History and Physical Interval Note:  05/24/2017 1:12 PM  Donna [name]  has presented today for surgery, with the diagnosis of AFLUTTER  The various methods of treatment have been discussed with the patient and family. After consideration of risks, benefits and other options for treatment, the patient has consented to  Procedure(s): CARDIOVERSION (N/A) as a surgical intervention .  The patient's history has been reviewed, patient examined, no change in status, stable for surgery.  I have reviewed the patient's chart and labs.  Questions were answered to the patient's satisfaction.     Deloyce Walthers [employer]

## 2017-05-24 NOTE — Anesthesia Procedure Notes (Signed)
Procedure Name: General with mask airway Date/Time: 05/24/2017 1:00 PM Performed by: Garrison Columbus T Pre-anesthesia Checklist: Patient identified, Emergency Drugs available, Suction available and Patient being monitored Patient Re-evaluated:Patient Re-evaluated prior to inductionOxygen Delivery Method: Ambu bag Preoxygenation: Pre-oxygenation with 100% oxygen Intubation Type: IV induction Placement Confirmation: positive ETCO2 and breath sounds checked- equal and bilateral Dental Injury: Teeth and Oropharynx as per pre-operative assessment

## 2017-05-25 ENCOUNTER — Encounter (HOSPITAL_COMMUNITY): Payer: Self-pay | Admitting: Cardiology

## 2017-05-28 ENCOUNTER — Telehealth: Payer: Self-pay | Admitting: Cardiology

## 2017-05-28 MED ORDER — METOPROLOL TARTRATE 50 MG PO TABS: 25.0000 mg | ORAL_TABLET | Freq: Two times a day (BID) | ORAL | 3 refills | Status: DC

## 2017-05-28 MED ORDER — POTASSIUM CHLORIDE ER 10 MEQ PO TBCR: 10.0000 meq | EXTENDED_RELEASE_TABLET | Freq: Every day | ORAL | 1 refills | Status: DC | PRN

## 2017-05-28 NOTE — Telephone Encounter (Signed)
New message    Pt is calling stating that she does not feel well since her cardioversion.  Pt c/o Shortness Of Breath: STAT if SOB developed within the last 24 hours or pt is noticeably SOB on the phone  1. Are you currently SOB (can you hear that pt is SOB on the phone)? No   2. How long have you been experiencing SOB? Since Thursday  3. Are you SOB when sitting or when up moving around? Up moving around  4. Are you currently experiencing any other symptoms? Weak, headache, dizzy

## 2017-05-28 NOTE — Telephone Encounter (Signed)
Pt reports not feeling well since DCCV 7/5.  Reports she is SOB and her dizziness is worse than usual.  She can't even walk across her apartment (which is small) w/o experiencing SOB. States that she almost fell the other day when dizzy.  Reports rings are tighter than usual but no other obvious signs of fluid retention.  She feels that she can hardly function. HR 50/60s at rest.  SBPs can vary, but avg > 120s.  Advised pt to:  - decrease Amiodarone to once daily  - decrease Metoprolol to 25 mg BID  - take Lasix 20 mg &  K+ 10 mEq for 3 days   Also advised to continue to monitor and call if symptoms/concerns do not improve. She is also agreeable to continue to monitor BPs,  and call if SBP is elevated above 130s (after decreasing Lopressor).  Advised to call by end of the week if no improvement.  Explained that I would follow up with her in next several weeks if I have not heard from her. She appreciates talking with her and agreeable to plan.

## 2017-06-08 ENCOUNTER — Other Ambulatory Visit: Payer: Self-pay | Admitting: Osteopathic Medicine

## 2017-06-08 DIAGNOSIS — G479 Sleep disorder, unspecified: Secondary | ICD-10-CM

## 2017-06-25 ENCOUNTER — Telehealth: Payer: Self-pay | Admitting: Cardiology

## 2017-06-25 NOTE — Telephone Encounter (Signed)
Patient calling, states that her amiodarone medication dosage was lowered and that she was told to contact office if she had any problems. Patient states that she believes she is going into AFIB again.

## 2017-06-25 NOTE — Telephone Encounter (Signed)
Spoke with pt and feels like she is in a fib again  Went to bathroom and upon returning checked heart rate and was 95 with very little activity also notes chest pressure and some sob .See other phone note with recent med changes.Discussed with Dr Rayann Heman have pt increase Amiodarone to bid as previous and see Dr Curt Bears this week.Pt aware and appt made with Dr Curt Bears in [facility] on wed 06-27-17 at 1:30 .Adonis Housekeeper

## 2017-06-27 ENCOUNTER — Other Ambulatory Visit: Payer: Self-pay | Admitting: Cardiology

## 2017-06-27 ENCOUNTER — Ambulatory Visit (INDEPENDENT_AMBULATORY_CARE_PROVIDER_SITE_OTHER): Payer: Medicare Other | Admitting: Cardiology

## 2017-06-27 ENCOUNTER — Encounter: Payer: Self-pay | Admitting: Cardiology

## 2017-06-27 VITALS — BP 135/79 | HR 60 | Ht 64.0 in | Wt 148.4 lb

## 2017-06-27 DIAGNOSIS — E785 Hyperlipidemia, unspecified: Secondary | ICD-10-CM | POA: Diagnosis not present

## 2017-06-27 DIAGNOSIS — I495 Sick sinus syndrome: Secondary | ICD-10-CM | POA: Diagnosis not present

## 2017-06-27 DIAGNOSIS — I5032 Chronic diastolic (congestive) heart failure: Secondary | ICD-10-CM

## 2017-06-27 DIAGNOSIS — I4819 Other persistent atrial fibrillation: Secondary | ICD-10-CM

## 2017-06-27 DIAGNOSIS — I481 Persistent atrial fibrillation: Secondary | ICD-10-CM

## 2017-06-27 NOTE — Patient Instructions (Signed)
Cardiac Ablation  Cardiac ablation is a procedure to stop some heart tissue from causing problems. The heart has many electrical connections. Sometimes these connections make the heart beat very fast or irregularly. Removing some problem areas can improve the heart rhythm or make it normal.  What happens before the procedure?   Follow instructions from your doctor about what you cannot eat or drink.   Ask your doctor about:  ? Changing or stopping your normal medicines. This is important if you take diabetes medicines or blood thinners.  ? Taking medicines such as aspirin and ibuprofen. These medicines can thin your blood. Do not take these medicines before your procedure if your doctor tells you not to.   Plan to have someone take you home.   If you will be going home right after the procedure, plan to have someone with you for 24 hours.  What happens during the procedure?   To lower your risk of infection:  ? Your health care team will wash or sanitize their hands.  ? Your skin will be washed with soap.  ? Hair may be removed from your neck or groin.   An IV tube will be put into one of your veins.   You will be given a medicine to help you relax (sedative).   Skin on your neck or groin will be numbed.   A cut (incision) will be made in your neck or groin.   A needle will be put through your cut and into a vein in your neck or groin.   A tube (catheter) will be put into the needle. The tube will be moved to your heart. X-rays (fluoroscopy) will be used to help guide the tube.   Small devices (electrodes) on the tip of the tube will send out electrical currents.   Dye may be put through the tube. This helps your surgeon see your heart.   Electrical energy will be used to scar (ablate) some heart tissue. Your surgeon may use:  ? Heat (radiofrequency energy).  ? Laser energy.  ? Extreme cold (cryoablation).   The tube will be taken out.   Pressure will be held on your cut. This helps stop  bleeding.   A bandage (dressing) will be put on your cut.  The procedure may vary.  What happens after the procedure?   You will be monitored until your medicines have worn off.   Your cut will be watched for bleeding. You will need to lie still for a few hours.   Do not drive for 24 hours or as long as your doctor tells you.  Summary   Cardiac ablation is a procedure to stop some heart tissue from causing problems.   Electrical energy will be used to scar (ablate) some heart tissue.  This information is not intended to replace advice given to you by your health care provider. Make sure you discuss any questions you have with your health care provider.  Document Released: 07/09/2013 Document Revised: 09/25/2016 Document Reviewed: 09/25/2016  [employer]vier Interactive Patient Education  2017 [employer]

## 2017-06-27 NOTE — Progress Notes (Signed)
Electrophysiology Office Note   Date:  06/27/2017   ID:  Donna [name], Donna [name] Donna [name], MRN [id number]  PCP:  [name], DO  Primary Electrophysiologist:  Rosela Supak Meredith Leeds, MD    Chief Complaint  Patient presents with  . Follow-up    PAF  . Shortness of Breath  . Chest Pain     History of Present Illness: Donna [name] is a 76 y.o. female who presents today for electrophysiology evaluation.   She has a history of atrial fibrillation, and a pacemaker placed for her atrial fibrillation. She has had multiple hospitalizations and cardioversion for her atrial fibrillation. He continued to feel poorly due to her atrial fibrillation. She says that on Monday, she went out of rhythm. She was simply walking to the bathroom and noted that her heart rate was in the 90s. She sat down for a few hours and converted to normal rhythm. She was put on amiodarone 200 mg twice a day when she called in to clinic.  Today, denies symptoms of palpitations, chest pain, shortness of breath, orthopnea, PND, lower extremity edema, claudication, dizziness, presyncope, syncope, bleeding, or neurologic sequela. The patient is tolerating medications without difficulties and is otherwise without complaint today. She does say that she feels significant amounts of fatigue. She also has nausea that she feels is potentially related to the amiodarone. The nausea is worse when she takes amiodarone twice a day.  Past Medical History:  Diagnosis Date  . Atrial fibrillation ([place]) 10/17/2016   On Metoprolol, Eliquis  . Benign paroxysmal positional vertigo 10/18/2016  . CAD (coronary atherosclerotic disease) 10/17/2016   Hx MI  . Cystitis 10/18/2016  . Dysuria 10/18/2016  . History of anemia 10/18/2016  . History of gastric bypass 10/17/2016  . Hyperlipidemia 10/17/2016  . Lower extremity edema 10/18/2016  . Restless leg syndrome 10/17/2016  . Sleep disorder 10/17/2016   Modafinil for sleep apnea  and narcolepsy  . Tobacco dependence 10/17/2016  . Urinary frequency 10/18/2016  . Vitamin D deficiency 10/18/2016   Past Surgical History:  Procedure Laterality Date  . CARDIAC CATHETERIZATION    . CARDIOVERSION N/A 04/27/2017   Procedure: CARDIOVERSION;  Surgeon: [name], MD;  Location: [facility] ENDOSCOPY;  Service: Cardiovascular;  Laterality: N/A;  . CARDIOVERSION N/A 05/24/2017   Procedure: CARDIOVERSION;  Surgeon: [name], MD;  Location: [facility] ENDOSCOPY;  Service: Cardiovascular;  Laterality: N/A;     Current Outpatient Prescriptions  Medication Sig Dispense Refill  . amiodarone (PACERONE) 200 MG tablet Take 1 tablet (200 mg) twice daily for 1 month. After that, decrease to 1 tablet (200 mg) daily 90 tablet 1  . atorvastatin (LIPITOR) 10 MG tablet Take 1 tablet (10 mg total) by mouth daily. 90 tablet 3  . Cholecalciferol (VITAMIN D3) 5000 units CAPS Take 5,000 Units by mouth daily.     . clobetasol ointment (TEMOVATE) 3.53 % Apply 1 application topically 2 (two) times daily. 30 g 0  . co-enzyme Q-10 30 MG capsule Take 30 mg by mouth 2 (two) times daily.     [name] denosumab (PROLIA) 60 MG/ML SOLN injection Inject 60 mg into the skin every 6 (six) months. Administer in upper arm, thigh, or abdomen    . ELIQUIS 5 MG TABS tablet Take 1 tablet (5 mg total) by mouth 2 (two) times daily. 180 tablet 3  . Ferrous Sulfate (IRON) 325 (65 Fe) MG TABS Take 325 mg by mouth daily with breakfast.     . folic acid (FOLVITE)  800 MCG tablet Take 800 mcg by mouth daily.    . furosemide (LASIX) 20 MG tablet Take 1 tablet (20 mg total) by mouth as directed. Take as directed (Patient taking differently: Take 20 mg by mouth daily as needed for fluid or edema. ) 20 tablet 0  . gentamicin cream (GARAMYCIN) 0.1 % Apply 1 application topically 3 (three) times daily. (Patient taking differently: Apply 1 application topically 3 (three) times daily. TO AFFECTED AREA(S) ON LEG(S)) 30 g 1  . loperamide (IMODIUM) 2  MG capsule Take 2 mg by mouth See admin instructions. After AM bowel movement (as needed for leakage)    . meclizine (ANTIVERT) 12.5 MG tablet Take 12.5 mg by mouth 2 (two) times daily.    . metoprolol tartrate (LOPRESSOR) 50 MG tablet Take 0.5 tablets (25 mg total) by mouth 2 (two) times daily. 90 tablet 3  . modafinil (PROVIGIL) 200 MG tablet TAKE ONE TABLET BY MOUTH TWICE DAILY 60 tablet 5  . nitrofurantoin (MACRODANTIN) 100 MG capsule Take 1 capsule (100 mg total) by mouth at bedtime. To prevent UTI 90 capsule 1  . ondansetron (ZOFRAN-ODT) 8 MG disintegrating tablet Take 1 tablet (8 mg total) by mouth every 8 (eight) hours as needed for nausea. 20 tablet 3  . potassium chloride (K-DUR) 10 MEQ tablet Take 1 tablet (10 mEq total) by mouth daily as needed. When you take Lasix 15 tablet 1  . pramipexole (MIRAPEX) 0.25 MG tablet Take 1 tablet (0.25 mg total) by mouth 2 (two) times daily. 180 tablet 3   No current facility-administered medications for this visit.     Allergies:   Vancomycin; Penicillins; and Tape   Social History:  The patient  reports that she has quit smoking. She has a 55.00 pack-year smoking history. She has never used smokeless tobacco. She reports that she does not drink alcohol or use drugs.   Family History:  The patient's family history includes Bladder Cancer in her brother; Diabetes in her father; Emphysema in her father and mother; Heart attack in her maternal grandmother; Heart disease in her father; Hypertension in her brother, brother, and sister; Kidney failure in her paternal grandmother; Stroke in her paternal grandfather.    ROS:  Please see the history of present illness.   Otherwise, review of systems is positive for SOB, chest pain.   All other systems are reviewed and negative.   PHYSICAL EXAM: VS:  BP 135/79   Pulse 60   Ht 5\' 4"  (1.626 m)   Wt 148 lb 6.4 oz (67.3 kg)   BMI 25.47 kg/m  , BMI Body mass index is 25.47 kg/m. GEN: Well nourished, well  developed, in no acute distress  HEENT: normal  Neck: no JVD, carotid bruits, or masses Cardiac: RRR; no murmurs, rubs, or gallops,no edema  Respiratory:  clear to auscultation bilaterally, normal work of breathing GI: soft, nontender, nondistended, + BS MS: no deformity or atrophy  Skin: warm and dry, device site well healed Neuro:  Strength and sensation are intact Psych: euthymic mood, full affect  EKG:  EKG is ordered today. Personal review of the ekg ordered shows A paced, rate 60anteroseptal infarct  Personal review of the device interrogation today. Results in [place]: 10/17/2016: Brain Natriuretic Peptide 238.4; TSH 2.53 04/20/2017: ALT 17 05/16/2017: BUN 15; Creatinine, Ser 0.76; Hemoglobin 13.1; Platelets 281; Potassium 4.7; Sodium 141    Lipid Panel     Component Value Date/Time   CHOL 214 (H) 01/18/2017  1610   TRIG 81 01/18/2017 0833   HDL 109 01/18/2017 0833   CHOLHDL 2.0 01/18/2017 0833   VLDL 16 01/18/2017 0833   LDLCALC 89 01/18/2017 0833     Wt Readings from Last 3 Encounters:  06/27/17 148 lb 6.4 oz (67.3 kg)  05/04/17 149 lb 6.4 oz (67.8 kg)  04/27/17 148 lb (67.1 kg)      Other studies Reviewed: Additional studies/ records that were reviewed today include: TTE 10/26/16  Review of the above records today demonstrates:  - Left ventricle: The cavity size was normal. Wall thickness was   increased in a pattern of mild LVH. Systolic function was normal.   The estimated ejection fraction was in the range of 55% to 60%.   Wall motion was normal; there were no regional wall motion   abnormalities. Features are consistent with a pseudonormal left   ventricular filling pattern, with concomitant abnormal relaxation   and increased filling pressure (grade 2 diastolic dysfunction). - Aortic valve: Trileaflet; moderately calcified leaflets.   Sclerosis without stenosis. - Mitral valve: Moderately calcified annulus. Mildly calcified   leaflets .  There was trivial regurgitation. - Left atrium: The atrium was moderately dilated. - Right ventricle: The cavity size was mildly dilated. Pacer wire   or catheter noted in right ventricle. Systolic function was   normal. - Right atrium: The atrium was moderately dilated. - Tricuspid valve: Peak RV-RA gradient (S): 26 mm Hg. - Pulmonary arteries: PA peak pressure: 29 mm Hg (S). - Inferior vena cava: The vessel was normal in size. The   respirophasic diameter changes were in the normal range (>= 50%),   consistent with normal central venous pressure.   ASSESSMENT AND PLAN:  1.  Atrial fibrillation/flutter: Has symptoms of fatigue and shortness of breath due to atrial fibrillation likely. I discussed with her further medical management versus ablation. Risks and benefits of ablation were discussed. Risks include but not limited to bleeding, tamponade, heart block, stroke, and damage to surrounding organs. She understands these risks and has agreed to the procedure. I have told her that if she wishes she can go back to 200 mg of amiodarone daily, and increase to 200 twice a day if she has more symptoms.  This patients CHA2DS2-VASc Score and unadjusted Ischemic Stroke Rate (% per year) is equal to 4.8 % stroke rate/year from a score of 4  Above score calculated as 1 point each if present [CHF, HTN, DM, Vascular=MI/PAD/Aortic Plaque, Age if 65-74, or Female] Above score calculated as 2 points each if present [Age > 75, or Stroke/TIA/TE]   2. Pacemaker: Leads stable. No changes at this time.  3. Hypertension: Blood pressure stable. No changes at this time.  4. Diastolic heart failure: Volume status stable.  5. Coronary artery disease: Currently feeling well without chest pain.  Current medicines are reviewed at length with the patient today.   The patient does not have concerns regarding her medicines.  The following changes were made today:  none  Labs/ tests ordered today include:    Orders Placed This Encounter  Procedures  . EKG 12-Lead     Disposition:   FU with Zettie Gootee 3 months  Signed, Daphney Hopke Meredith Leeds, MD  06/27/2017 1:54 PM     [address] [place] [id number] [phone] (office) [phone] (fax)

## 2017-07-02 ENCOUNTER — Ambulatory Visit (INDEPENDENT_AMBULATORY_CARE_PROVIDER_SITE_OTHER): Payer: Medicare Other | Admitting: Podiatry

## 2017-07-02 ENCOUNTER — Ambulatory Visit (INDEPENDENT_AMBULATORY_CARE_PROVIDER_SITE_OTHER): Payer: Medicare Other

## 2017-07-02 DIAGNOSIS — Z9889 Other specified postprocedural states: Secondary | ICD-10-CM

## 2017-07-02 DIAGNOSIS — M2022 Hallux rigidus, left foot: Secondary | ICD-10-CM

## 2017-07-02 DIAGNOSIS — M2021 Hallux rigidus, right foot: Secondary | ICD-10-CM | POA: Diagnosis not present

## 2017-07-02 NOTE — Progress Notes (Signed)
   HPI: 76 year old female presents to office today status post right foot reconstructive surgery. Date of surgery 02/15/2017. Patient presents today for her final follow-up visit. Patient denies pain. She states otherwise she is doing very well.   Physical Exam: General: The patient is alert and oriented x3 in no acute distress.  Dermatology: Skin is warm, dry and supple bilateral lower extremities. Negative for open lesions or macerations.  Vascular: Palpable pedal pulses bilaterally. No edema or erythema noted. Capillary refill within normal limits.  Neurological: Epicritic and protective threshold grossly intact bilaterally.   Musculoskeletal Exam: Range of motion within normal limits to all pedal and ankle joints bilateral. Muscle strength 5/5 in all groups bilateral.  There is some varus malrotation of the digits 4 and 5 of the right foot. These are nonsymptomatic however. Likely rotation occurred during the healing phase postoperatively.  Radiographic Exam:  Normal osseous mineralization. Orthopedic hardware and surgical sites appear stable and intact. Moderate DJD noted significantly to the first MTPJ right foot.  Assessment: 1. Status post right forefoot reconstructive surgery. Date of surgery 02/15/2017   Plan of Care:  1. Patient was evaluated. X-rays reviewed today 2. Recommend the patient wear good supportive shoe gear. Return to activity full activity no restrictions. 3. Return to clinic when necessary   Edrick Kins, DPM [facility]  Dr. Edrick Kins, DPM    [address], [place] [address]                Office [phone]  Fax [phone]

## 2017-07-04 ENCOUNTER — Telehealth: Payer: Self-pay | Admitting: Cardiology

## 2017-07-04 DIAGNOSIS — I4819 Other persistent atrial fibrillation: Secondary | ICD-10-CM

## 2017-07-04 DIAGNOSIS — Z01812 Encounter for preprocedural laboratory examination: Secondary | ICD-10-CM

## 2017-07-04 NOTE — Telephone Encounter (Signed)
Spoke with patient and advised her the Donna [name] will be back in the office tomorrow and will be calling her.

## 2017-07-04 NOTE — Telephone Encounter (Signed)
Patient calling, states that she was told by Dr. Curt Bears that she would get a call in regards to scheduling an ablation. Patient would like an update and is aware Venida Jarvis is out of the office today.

## 2017-07-05 ENCOUNTER — Encounter: Payer: Self-pay | Admitting: *Deleted

## 2017-07-05 NOTE — Telephone Encounter (Signed)
Scheduled AFib ablation for 9/21. Instructions reviewed w/ pt and letter sent via [place]. Importance of continuing blood thinner discussed w/ patient. She understands office will call her to schedule CT, once it is pre-certified w/ insurance. She also understands I will call her to arrange pre procedure lab work once CT is scheduled.  Patient verbalized understanding and agreeable to plan.

## 2017-07-13 ENCOUNTER — Encounter: Payer: Self-pay | Admitting: Cardiology

## 2017-07-16 ENCOUNTER — Telehealth: Payer: Self-pay | Admitting: Cardiology

## 2017-07-16 NOTE — Telephone Encounter (Signed)
Pt informed Dr. Curt Bears does not feel that black mold is affecting her AFib. She verbalized understanding and thanks me for letting her know.  We also scheduled her pre CT lab work.

## 2017-07-16 NOTE — Telephone Encounter (Signed)
Donna [name] is calling because she is wanting to know if the black mold can affect her AFIB . Please call

## 2017-07-24 ENCOUNTER — Ambulatory Visit (INDEPENDENT_AMBULATORY_CARE_PROVIDER_SITE_OTHER): Payer: Medicare Other | Admitting: *Deleted

## 2017-07-24 DIAGNOSIS — I495 Sick sinus syndrome: Secondary | ICD-10-CM | POA: Diagnosis not present

## 2017-07-25 ENCOUNTER — Ambulatory Visit: Payer: Medicare Other | Admitting: Cardiology

## 2017-07-25 NOTE — Progress Notes (Signed)
Remote pacemaker transmission.   

## 2017-07-31 ENCOUNTER — Telehealth: Payer: Self-pay | Admitting: Cardiology

## 2017-07-31 NOTE — Telephone Encounter (Signed)
New Message     Pt c/o BP issue: STAT if pt c/o blurred vision, one-sided weakness or slurred speech  1. What are your last 5 BP readings?  144/88   2. Are you having any other symptoms (ex. Dizziness, headache, blurred vision, passed out)?  no  3. What is your BP issue?  Wants to go back to higher dosage of medication

## 2017-07-31 NOTE — Telephone Encounter (Signed)
Reports BPs have been increasing lately.   170/95 144/88 159/88 She would like to know if she can increase Metoprolol back to 50 mg BID (she is currently taking 25 mg BID) for better BP control. Advised pt to discuss w/ PCP but from cardiology standpoint ok to increase it.  Educated that she is taking that for HR control, for AFib, from cardiology standpoint and should confirm w/ PCP. Patient verbalized understanding and agreeable to plan.

## 2017-08-01 ENCOUNTER — Encounter: Payer: Self-pay | Admitting: Cardiology

## 2017-08-01 ENCOUNTER — Other Ambulatory Visit: Payer: Medicare Other | Admitting: *Deleted

## 2017-08-01 DIAGNOSIS — I481 Persistent atrial fibrillation: Secondary | ICD-10-CM | POA: Diagnosis not present

## 2017-08-01 DIAGNOSIS — Z01812 Encounter for preprocedural laboratory examination: Secondary | ICD-10-CM

## 2017-08-01 DIAGNOSIS — I4819 Other persistent atrial fibrillation: Secondary | ICD-10-CM

## 2017-08-01 LAB — CBC WITH DIFFERENTIAL/PLATELET
BASOS ABS: 0 10*3/uL (ref 0.0–0.2)
Basos: 0 %
EOS (ABSOLUTE): 0.1 10*3/uL (ref 0.0–0.4)
Eos: 2 %
HEMATOCRIT: 42.3 % (ref 34.0–46.6)
Hemoglobin: 13.9 g/dL (ref 11.1–15.9)
IMMATURE GRANULOCYTES: 0 %
Immature Grans (Abs): 0 10*3/uL (ref 0.0–0.1)
LYMPHS ABS: 1.5 10*3/uL (ref 0.7–3.1)
Lymphs: 26 %
MCH: 28.9 pg (ref 26.6–33.0)
MCHC: 32.9 g/dL (ref 31.5–35.7)
MCV: 88 fL (ref 79–97)
MONOS ABS: 0.5 10*3/uL (ref 0.1–0.9)
Monocytes: 9 %
NEUTROS PCT: 63 %
Neutrophils Absolute: 3.8 10*3/uL (ref 1.4–7.0)
PLATELETS: 229 10*3/uL (ref 150–379)
RBC: 4.81 x10E6/uL (ref 3.77–5.28)
RDW: 14.1 % (ref 12.3–15.4)
WBC: 6 10*3/uL (ref 3.4–10.8)

## 2017-08-01 LAB — BASIC METABOLIC PANEL
BUN/Creatinine Ratio: 18 (ref 12–28)
BUN: 13 mg/dL (ref 8–27)
CALCIUM: 8.9 mg/dL (ref 8.7–10.3)
CHLORIDE: 99 mmol/L (ref 96–106)
CO2: 28 mmol/L (ref 20–29)
CREATININE: 0.73 mg/dL (ref 0.57–1.00)
GFR calc non Af Amer: 82 mL/min/{1.73_m2} (ref >59)
GFR, EST AFRICAN AMERICAN: 94 mL/min/{1.73_m2} (ref >59)
Glucose: 85 mg/dL (ref 65–99)
Potassium: 4.9 mmol/L (ref 3.5–5.2)
Sodium: 141 mmol/L (ref 134–144)

## 2017-08-03 ENCOUNTER — Ambulatory Visit (HOSPITAL_COMMUNITY)
Admission: RE | Admit: 2017-08-03 | Discharge: 2017-08-03 | Disposition: A | Discharge status: Home / Self Care | Payer: Medicare Other | Source: Ambulatory Visit | Attending: Cardiology | Admitting: Cardiology

## 2017-08-03 ENCOUNTER — Ambulatory Visit (HOSPITAL_COMMUNITY): Payer: Medicare Other

## 2017-08-03 DIAGNOSIS — I4891 Unspecified atrial fibrillation: Secondary | ICD-10-CM | POA: Diagnosis not present

## 2017-08-03 DIAGNOSIS — I481 Persistent atrial fibrillation: Secondary | ICD-10-CM | POA: Diagnosis not present

## 2017-08-03 DIAGNOSIS — I4819 Other persistent atrial fibrillation: Secondary | ICD-10-CM

## 2017-08-03 LAB — CUP PACEART REMOTE DEVICE CHECK
Battery Remaining Percentage: 100 %
Brady Statistic RA Percent Paced: 94 %
Date Time Interrogation Session: 20180904074300
Implantable Lead Implant Date: 20150324
Implantable Lead Location: 753859
Implantable Lead Location: 753860
Implantable Lead Model: 4479
Implantable Lead Serial Number: 479154
Implantable Pulse Generator Implant Date: 20150324
Lead Channel Impedance Value: 512 Ohm
Lead Channel Pacing Threshold Amplitude: 0.4 V
Lead Channel Pacing Threshold Pulse Width: 0.4 ms
Lead Channel Setting Pacing Amplitude: 2 V
Lead Channel Setting Pacing Amplitude: 2 V
Lead Channel Setting Pacing Pulse Width: 0.4 ms
MDC IDC LEAD IMPLANT DT: 20150324
MDC IDC LEAD SERIAL: 531114
MDC IDC MSMT BATTERY REMAINING LONGEVITY: 72 mo
MDC IDC MSMT LEADCHNL RA PACING THRESHOLD PULSEWIDTH: 0.5 ms
MDC IDC MSMT LEADCHNL RV IMPEDANCE VALUE: 580 Ohm
MDC IDC MSMT LEADCHNL RV PACING THRESHOLD AMPLITUDE: 1 V
MDC IDC SET LEADCHNL RV SENSING SENSITIVITY: 2.5 mV
MDC IDC STAT BRADY RV PERCENT PACED: 39 %
Pulse Gen Serial Number: 391090

## 2017-08-03 MED ORDER — NITROGLYCERIN 0.4 MG SL SUBL: 0.4000 mg | SUBLINGUAL_TABLET | Freq: Once | SUBLINGUAL | Status: DC

## 2017-08-03 MED ORDER — NITROGLYCERIN 0.4 MG SL SUBL
SUBLINGUAL_TABLET | SUBLINGUAL | Status: AC
  Filled 2017-08-03: qty 1

## 2017-08-03 MED ORDER — IOPAMIDOL (ISOVUE-370) INJECTION 76%
INTRAVENOUS | Status: AC
  Filled 2017-08-03: qty 100

## 2017-08-06 ENCOUNTER — Other Ambulatory Visit: Payer: Self-pay | Admitting: Osteopathic Medicine

## 2017-08-06 DIAGNOSIS — H8110 Benign paroxysmal vertigo, unspecified ear: Secondary | ICD-10-CM

## 2017-08-10 ENCOUNTER — Ambulatory Visit (HOSPITAL_COMMUNITY): Payer: Medicare Other | Admitting: Certified Registered Nurse Anesthetist

## 2017-08-10 ENCOUNTER — Ambulatory Visit (HOSPITAL_COMMUNITY)
Admission: RE | Admit: 2017-08-10 | Discharge: 2017-08-11 | Disposition: A | Discharge status: Home / Self Care | Payer: Medicare Other | Source: Ambulatory Visit | Attending: Cardiology | Admitting: Cardiology

## 2017-08-10 ENCOUNTER — Encounter (HOSPITAL_COMMUNITY)
Admission: RE | Disposition: A | Discharge status: Home / Self Care | Payer: Self-pay | Source: Ambulatory Visit | Attending: Cardiology

## 2017-08-10 DIAGNOSIS — E559 Vitamin D deficiency, unspecified: Secondary | ICD-10-CM | POA: Diagnosis not present

## 2017-08-10 DIAGNOSIS — I481 Persistent atrial fibrillation: Secondary | ICD-10-CM | POA: Insufficient documentation

## 2017-08-10 DIAGNOSIS — I252 Old myocardial infarction: Secondary | ICD-10-CM | POA: Insufficient documentation

## 2017-08-10 DIAGNOSIS — I110 Hypertensive heart disease with heart failure: Secondary | ICD-10-CM | POA: Insufficient documentation

## 2017-08-10 DIAGNOSIS — Z880 Allergy status to penicillin: Secondary | ICD-10-CM | POA: Diagnosis not present

## 2017-08-10 DIAGNOSIS — I4891 Unspecified atrial fibrillation: Secondary | ICD-10-CM | POA: Diagnosis present

## 2017-08-10 DIAGNOSIS — I480 Paroxysmal atrial fibrillation: Secondary | ICD-10-CM

## 2017-08-10 DIAGNOSIS — Z79899 Other long term (current) drug therapy: Secondary | ICD-10-CM | POA: Insufficient documentation

## 2017-08-10 DIAGNOSIS — Z881 Allergy status to other antibiotic agents status: Secondary | ICD-10-CM | POA: Insufficient documentation

## 2017-08-10 DIAGNOSIS — G47419 Narcolepsy without cataplexy: Secondary | ICD-10-CM | POA: Diagnosis not present

## 2017-08-10 DIAGNOSIS — Z7901 Long term (current) use of anticoagulants: Secondary | ICD-10-CM | POA: Diagnosis not present

## 2017-08-10 DIAGNOSIS — Z87891 Personal history of nicotine dependence: Secondary | ICD-10-CM | POA: Diagnosis not present

## 2017-08-10 DIAGNOSIS — Z91048 Other nonmedicinal substance allergy status: Secondary | ICD-10-CM | POA: Insufficient documentation

## 2017-08-10 DIAGNOSIS — I5030 Unspecified diastolic (congestive) heart failure: Secondary | ICD-10-CM | POA: Diagnosis not present

## 2017-08-10 DIAGNOSIS — I2510 Atherosclerotic heart disease of native coronary artery without angina pectoris: Secondary | ICD-10-CM | POA: Insufficient documentation

## 2017-08-10 DIAGNOSIS — E785 Hyperlipidemia, unspecified: Secondary | ICD-10-CM | POA: Diagnosis not present

## 2017-08-10 DIAGNOSIS — I5032 Chronic diastolic (congestive) heart failure: Secondary | ICD-10-CM | POA: Diagnosis not present

## 2017-08-10 DIAGNOSIS — G2581 Restless legs syndrome: Secondary | ICD-10-CM | POA: Insufficient documentation

## 2017-08-10 DIAGNOSIS — I4892 Unspecified atrial flutter: Secondary | ICD-10-CM | POA: Diagnosis not present

## 2017-08-10 DIAGNOSIS — Z950 Presence of cardiac pacemaker: Secondary | ICD-10-CM | POA: Insufficient documentation

## 2017-08-10 DIAGNOSIS — G4730 Sleep apnea, unspecified: Secondary | ICD-10-CM | POA: Insufficient documentation

## 2017-08-10 HISTORY — PX: ATRIAL FIBRILLATION ABLATION: EP1191

## 2017-08-10 LAB — POCT ACTIVATED CLOTTING TIME
ACTIVATED CLOTTING TIME: 324 s
ACTIVATED CLOTTING TIME: 357 s
ACTIVATED CLOTTING TIME: 147 s

## 2017-08-10 LAB — GLUCOSE, CAPILLARY
GLUCOSE-CAPILLARY: 113 mg/dL — AB (ref 65–99)
Glucose-Capillary: 77 mg/dL (ref 65–99)

## 2017-08-10 SURGERY — ATRIAL FIBRILLATION ABLATION
Anesthesia: General

## 2017-08-10 MED ORDER — SODIUM CHLORIDE 0.9% FLUSH
3.0000 mL | Freq: Two times a day (BID) | INTRAVENOUS | Status: DC
  Administered 2017-08-10: 3 mL via INTRAVENOUS

## 2017-08-10 MED ORDER — ACETAMINOPHEN 325 MG PO TABS: 650.0000 mg | ORAL_TABLET | ORAL | Status: DC | PRN

## 2017-08-10 MED ORDER — POTASSIUM CHLORIDE CRYS ER 10 MEQ PO TBCR
10.0000 meq | EXTENDED_RELEASE_TABLET | Freq: Every day | ORAL | Status: DC
  Filled 2017-08-10 (×2): qty 1

## 2017-08-10 MED ORDER — ONDANSETRON HCL 4 MG/2ML IJ SOLN
INTRAMUSCULAR | Status: DC | PRN
  Administered 2017-08-10: 4 mg via INTRAVENOUS

## 2017-08-10 MED ORDER — BUPIVACAINE HCL (PF) 0.25 % IJ SOLN
INTRAMUSCULAR | Status: AC
  Filled 2017-08-10: qty 60

## 2017-08-10 MED ORDER — PHENYLEPHRINE HCL 10 MG/ML IJ SOLN
INTRAMUSCULAR | Status: DC | PRN
  Administered 2017-08-10: 120 ug via INTRAVENOUS

## 2017-08-10 MED ORDER — LOPERAMIDE HCL 2 MG PO CAPS: 2.0000 mg | ORAL_CAPSULE | Freq: Every day | ORAL | Status: DC | PRN

## 2017-08-10 MED ORDER — DOBUTAMINE IN D5W 4-5 MG/ML-% IV SOLN
INTRAVENOUS | Status: AC
  Filled 2017-08-10: qty 250

## 2017-08-10 MED ORDER — NITROFURANTOIN MACROCRYSTAL 100 MG PO CAPS
100.0000 mg | ORAL_CAPSULE | Freq: Every day | ORAL | Status: DC
  Administered 2017-08-10: 100 mg via ORAL
  Filled 2017-08-10: qty 1

## 2017-08-10 MED ORDER — DEXAMETHASONE SODIUM PHOSPHATE 10 MG/ML IJ SOLN
INTRAMUSCULAR | Status: DC | PRN
  Administered 2017-08-10: 10 mg via INTRAVENOUS

## 2017-08-10 MED ORDER — METOPROLOL TARTRATE 25 MG PO TABS
25.0000 mg | ORAL_TABLET | Freq: Two times a day (BID) | ORAL | Status: DC
  Administered 2017-08-11: 25 mg via ORAL
  Filled 2017-08-10 (×2): qty 1

## 2017-08-10 MED ORDER — COENZYME Q10 30 MG PO CAPS: 30.0000 mg | ORAL_CAPSULE | Freq: Two times a day (BID) | ORAL | Status: DC

## 2017-08-10 MED ORDER — ONDANSETRON HCL 4 MG PO TABS: 8.0000 mg | ORAL_TABLET | Freq: Three times a day (TID) | ORAL | Status: DC | PRN

## 2017-08-10 MED ORDER — HEPARIN (PORCINE) IN NACL 2-0.9 UNIT/ML-% IJ SOLN
INTRAMUSCULAR | Status: AC
  Filled 2017-08-10: qty 500

## 2017-08-10 MED ORDER — PROPOFOL 10 MG/ML IV BOLUS
INTRAVENOUS | Status: DC | PRN
  Administered 2017-08-10: 150 mg via INTRAVENOUS

## 2017-08-10 MED ORDER — FERROUS SULFATE 325 (65 FE) MG PO TABS
325.0000 mg | ORAL_TABLET | Freq: Every day | ORAL | Status: DC
  Administered 2017-08-11: 325 mg via ORAL
  Filled 2017-08-10: qty 1

## 2017-08-10 MED ORDER — ROCURONIUM BROMIDE 100 MG/10ML IV SOLN
INTRAVENOUS | Status: DC | PRN
  Administered 2017-08-10: 50 mg via INTRAVENOUS

## 2017-08-10 MED ORDER — ATORVASTATIN CALCIUM 10 MG PO TABS
10.0000 mg | ORAL_TABLET | Freq: Every day | ORAL | Status: DC
  Administered 2017-08-10: 10 mg via ORAL
  Filled 2017-08-10: qty 1

## 2017-08-10 MED ORDER — FENTANYL CITRATE (PF) 100 MCG/2ML IJ SOLN
INTRAMUSCULAR | Status: DC | PRN
  Administered 2017-08-10: 100 ug via INTRAVENOUS

## 2017-08-10 MED ORDER — HEPARIN (PORCINE) IN NACL 2-0.9 UNIT/ML-% IJ SOLN
INTRAMUSCULAR | Status: AC | PRN
  Administered 2017-08-10: 2000 mL

## 2017-08-10 MED ORDER — LIDOCAINE HCL (CARDIAC) 20 MG/ML IV SOLN
INTRAVENOUS | Status: DC | PRN
  Administered 2017-08-10: 60 mg via INTRAVENOUS

## 2017-08-10 MED ORDER — SODIUM CHLORIDE 0.9 % IV SOLN: 250.0000 mL | INTRAVENOUS | Status: DC | PRN

## 2017-08-10 MED ORDER — OXYCODONE HCL 5 MG PO TABS: 5.0000 mg | ORAL_TABLET | Freq: Once | ORAL | Status: DC | PRN

## 2017-08-10 MED ORDER — DENOSUMAB 60 MG/ML ~~LOC~~ SOLN: 60.0000 mg | SUBCUTANEOUS | Status: DC

## 2017-08-10 MED ORDER — LIDOCAINE HCL (PF) 1 % IJ SOLN: INTRAMUSCULAR | Status: DC | PRN

## 2017-08-10 MED ORDER — BUPIVACAINE HCL (PF) 0.25 % IJ SOLN
INTRAMUSCULAR | Status: DC | PRN
  Administered 2017-08-10: 60 mL

## 2017-08-10 MED ORDER — DOBUTAMINE IN D5W 4-5 MG/ML-% IV SOLN
INTRAVENOUS | Status: DC | PRN
  Administered 2017-08-10: 20 ug/kg/min via INTRAVENOUS

## 2017-08-10 MED ORDER — HEPARIN SODIUM (PORCINE) 1000 UNIT/ML IJ SOLN
INTRAMUSCULAR | Status: AC
  Filled 2017-08-10: qty 1

## 2017-08-10 MED ORDER — HEPARIN SODIUM (PORCINE) 1000 UNIT/ML IJ SOLN
INTRAMUSCULAR | Status: DC | PRN
  Administered 2017-08-10: 13000 [IU] via INTRAVENOUS
  Administered 2017-08-10: 2000 [IU] via INTRAVENOUS

## 2017-08-10 MED ORDER — MECLIZINE HCL 25 MG PO TABS: 12.5000 mg | ORAL_TABLET | Freq: Two times a day (BID) | ORAL | Status: DC | PRN

## 2017-08-10 MED ORDER — LACTATED RINGERS IV SOLN
INTRAVENOUS | Status: DC | PRN
  Administered 2017-08-10: 11:00:00 via INTRAVENOUS

## 2017-08-10 MED ORDER — HEPARIN SODIUM (PORCINE) 1000 UNIT/ML IJ SOLN
INTRAMUSCULAR | Status: DC | PRN
  Administered 2017-08-10: 1000 [IU] via INTRAVENOUS

## 2017-08-10 MED ORDER — FOLIC ACID 1 MG PO TABS
1000.0000 ug | ORAL_TABLET | Freq: Every day | ORAL | Status: DC
  Administered 2017-08-10: 1 mg via ORAL
  Filled 2017-08-10: qty 1

## 2017-08-10 MED ORDER — HYDROMORPHONE HCL 1 MG/ML IJ SOLN: 0.2500 mg | INTRAMUSCULAR | Status: DC | PRN

## 2017-08-10 MED ORDER — MODAFINIL 100 MG PO TABS
200.0000 mg | ORAL_TABLET | Freq: Two times a day (BID) | ORAL | Status: DC
  Administered 2017-08-10 – 2017-08-11 (×2): 200 mg via ORAL
  Filled 2017-08-10 (×2): qty 2

## 2017-08-10 MED ORDER — SUGAMMADEX SODIUM 200 MG/2ML IV SOLN
INTRAVENOUS | Status: DC | PRN
  Administered 2017-08-10: 200 mg via INTRAVENOUS

## 2017-08-10 MED ORDER — AMIODARONE HCL 200 MG PO TABS
200.0000 mg | ORAL_TABLET | Freq: Every day | ORAL | Status: DC
  Administered 2017-08-10 – 2017-08-11 (×2): 200 mg via ORAL
  Filled 2017-08-10 (×2): qty 1

## 2017-08-10 MED ORDER — ONDANSETRON HCL 4 MG/2ML IJ SOLN: 4.0000 mg | Freq: Four times a day (QID) | INTRAMUSCULAR | Status: DC | PRN

## 2017-08-10 MED ORDER — FUROSEMIDE 20 MG PO TABS: 20.0000 mg | ORAL_TABLET | Freq: Every day | ORAL | Status: DC | PRN

## 2017-08-10 MED ORDER — CLINDAMYCIN PHOSPHATE 600 MG/50ML IV SOLN
600.0000 mg | Freq: Once | INTRAVENOUS | Status: AC
  Administered 2017-08-10: 600 mg via INTRAVENOUS
  Filled 2017-08-10: qty 50

## 2017-08-10 MED ORDER — PRAMIPEXOLE DIHYDROCHLORIDE 0.25 MG PO TABS
0.2500 mg | ORAL_TABLET | Freq: Two times a day (BID) | ORAL | Status: DC
  Administered 2017-08-10 – 2017-08-11 (×2): 0.25 mg via ORAL
  Filled 2017-08-10 (×2): qty 1

## 2017-08-10 MED ORDER — APIXABAN 5 MG PO TABS
5.0000 mg | ORAL_TABLET | Freq: Two times a day (BID) | ORAL | Status: DC
  Administered 2017-08-10 – 2017-08-11 (×2): 5 mg via ORAL
  Filled 2017-08-10 (×2): qty 1

## 2017-08-10 MED ORDER — VITAMIN D 1000 UNITS PO TABS
5000.0000 [IU] | ORAL_TABLET | Freq: Every day | ORAL | Status: DC
  Filled 2017-08-10: qty 5

## 2017-08-10 MED ORDER — PHENYLEPHRINE HCL 10 MG/ML IJ SOLN
INTRAVENOUS | Status: DC | PRN
  Administered 2017-08-10: 50 ug/min via INTRAVENOUS

## 2017-08-10 MED ORDER — OXYCODONE HCL 5 MG/5ML PO SOLN: 5.0000 mg | Freq: Once | ORAL | Status: DC | PRN

## 2017-08-10 MED ORDER — SODIUM CHLORIDE 0.9% FLUSH: 3.0000 mL | INTRAVENOUS | Status: DC | PRN

## 2017-08-10 MED ORDER — PROTAMINE SULFATE 10 MG/ML IV SOLN
INTRAVENOUS | Status: DC | PRN
  Administered 2017-08-10: 10 mg via INTRAVENOUS
  Administered 2017-08-10: 20 mg via INTRAVENOUS
  Administered 2017-08-10: 10 mg via INTRAVENOUS

## 2017-08-10 SURGICAL SUPPLY — 19 items
BAG SNAP BAND KOVER 36X36 (MISCELLANEOUS) ×2 IMPLANT
BLANKET WARM UNDERBOD FULL ACC (MISCELLANEOUS) ×2 IMPLANT
CATH SMTCH THERMOCOOL SF DF (CATHETERS) ×2 IMPLANT
CATH SOUNDSTAR ECO REPROCESSED (CATHETERS) ×2 IMPLANT
CATH VARIABLE LASSO NAV 2515 (CATHETERS) ×2 IMPLANT
CATH WEBSTER BI DIR CS D-F CRV (CATHETERS) ×2 IMPLANT
COVER SWIFTLINK CONNECTOR (BAG) ×2 IMPLANT
PACK EP LATEX FREE (CUSTOM PROCEDURE TRAY) ×1
PACK EP LF (CUSTOM PROCEDURE TRAY) ×1 IMPLANT
PAD DEFIB LIFELINK (PAD) ×2 IMPLANT
PATCH CARTO3 (PAD) ×4 IMPLANT
SHEATH AVANTI 11F 11CM (SHEATH) ×2 IMPLANT
SHEATH BAYLIS SUREFLEX  M 8.5 (SHEATH) ×2
SHEATH BAYLIS SUREFLEX M 8.5 (SHEATH) ×2 IMPLANT
SHEATH BAYLIS TRANSSEPTAL 98CM (NEEDLE) ×2 IMPLANT
SHEATH PINNACLE 7F 10CM (SHEATH) ×2 IMPLANT
SHEATH PINNACLE 8F 10CM (SHEATH) ×4 IMPLANT
SHEATH PINNACLE 9F 10CM (SHEATH) ×4 IMPLANT
TUBING SMART ABLATE COOLFLOW (TUBING) ×2 IMPLANT

## 2017-08-10 NOTE — Anesthesia Procedure Notes (Signed)
Procedure Name: Intubation Date/Time: 08/10/2017 11:18 AM Performed by: Lance Coon Pre-anesthesia Checklist: Patient identified, Suction available, Emergency Drugs available, Patient being monitored and Timeout performed Patient Re-evaluated:Patient Re-evaluated prior to induction Oxygen Delivery Method: Circle system utilized Preoxygenation: Pre-oxygenation with 100% oxygen Induction Type: IV induction Ventilation: Mask ventilation without difficulty Laryngoscope Size: Miller and 3 Grade View: Grade I Tube type: Oral Tube size: 7.0 mm Number of attempts: 1 Airway Equipment and Method: Stylet Placement Confirmation: ETT inserted through vocal cords under direct vision,  positive ETCO2 and breath sounds checked- equal and bilateral Secured at: 21 cm Tube secured with: Tape Dental Injury: Teeth and Oropharynx as per pre-operative assessment

## 2017-08-10 NOTE — Progress Notes (Addendum)
Site Area:  Left groin Site prior to removal: Level 0 Pressure applied for: 15 minutes Manual:  yes Pt status during pull:  asymptomatic Post pull site:  Level 0 Post pull instructions given:  yes Post pull pulses present:  yes Dressing applied:  Gauze, tegaderm Bedrest begins @:  15:20 Comments: Removed by Vear Clock RN

## 2017-08-10 NOTE — Anesthesia Postprocedure Evaluation (Signed)
Anesthesia Post Note  Patient: Donna [name]  Procedure(s) Performed: Procedure(s) (LRB): Atrial Fibrillation Ablation (N/A)     Patient location during evaluation: PACU Anesthesia Type: General Level of consciousness: awake and alert Pain management: pain level controlled Vital Signs Assessment: post-procedure vital signs reviewed and stable Respiratory status: spontaneous breathing, nonlabored ventilation and respiratory function stable Cardiovascular status: blood pressure returned to baseline and stable Postop Assessment: no apparent nausea or vomiting Anesthetic complications: no    Last Vitals:  Vitals:   08/10/17 1600 08/10/17 1635  BP: 110/64   Pulse: (!) 58   Resp: 18   Temp:  (!) 36.3 C  SpO2: 99%     Last Pain:  Vitals:   08/10/17 1635  TempSrc: Oral  PainSc:                  [name]

## 2017-08-10 NOTE — Progress Notes (Signed)
Paged Dr. Eula Fried with Cardiology fellow concerning pt's PM  25mg  of PO metoprolol. Pt's BP 94/65(75). Per MD, hold PM dose with parameters to hold if systolic BP is less than 161. Will continue to monitor and assess.  Jacqlyn Larsen, RN

## 2017-08-10 NOTE — Transfer of Care (Signed)
Immediate Anesthesia Transfer of Care Note  Patient: Donna [name]  Procedure(s) Performed: Procedure(s): Atrial Fibrillation Ablation (N/A)  Patient Location: Cath Lab  Anesthesia Type:General  Level of Consciousness: awake, alert , oriented and patient cooperative  Airway & Oxygen Therapy: Patient Spontanous Breathing  Post-op Assessment: Report given to RN, Post -op Vital signs reviewed and stable and Patient moving all extremities X 4  Post vital signs: Reviewed and stable  Last Vitals:  Vitals:   08/10/17 0914  BP: (!) 147/84  Pulse: (!) 59  Temp: 36.5 C  SpO2: 99%    Last Pain:  Vitals:   08/10/17 0914  TempSrc: Oral      Patients Stated Pain Goal: 7 (17/91/50 5697)  Complications: No apparent anesthesia complications

## 2017-08-10 NOTE — H&P (Signed)
History of Present Illness: Donna [name] is a 76 y.o. female who presents today for electrophysiology evaluation.   She has a history of atrial fibrillation, and a pacemaker placed for her atrial fibrillation. She has had multiple hospitalizations and cardioversion for her atrial fibrillation. He continued to feel poorly due to her atrial fibrillation. She says that on Monday, she went out of rhythm. She was simply walking to the bathroom and noted that her heart rate was in the 90s. She sat down for a few hours and converted to normal rhythm. She was put on amiodarone 200 mg twice a day when she called in to clinic.  Presents today for AF ablation. Has been compliant with eliquis.      Past Medical History:  Diagnosis Date  . Atrial fibrillation ([place]) 10/17/2016   On Metoprolol, Eliquis  . Benign paroxysmal positional vertigo 10/18/2016  . CAD (coronary atherosclerotic disease) 10/17/2016   Hx MI  . Cystitis 10/18/2016  . Dysuria 10/18/2016  . History of anemia 10/18/2016  . History of gastric bypass 10/17/2016  . Hyperlipidemia 10/17/2016  . Lower extremity edema 10/18/2016  . Restless leg syndrome 10/17/2016  . Sleep disorder 10/17/2016   Modafinil for sleep apnea and narcolepsy  . Tobacco dependence 10/17/2016  . Urinary frequency 10/18/2016  . Vitamin D deficiency 10/18/2016        Past Surgical History:  Procedure Laterality Date  . CARDIAC CATHETERIZATION    . CARDIOVERSION N/A 04/27/2017   Procedure: CARDIOVERSION;  Surgeon: [name], MD;  Location: [facility] ENDOSCOPY;  Service: Cardiovascular;  Laterality: N/A;  . CARDIOVERSION N/A 05/24/2017   Procedure: CARDIOVERSION;  Surgeon: [name], MD;  Location: [facility] ENDOSCOPY;  Service: Cardiovascular;  Laterality: N/A;           Current Outpatient Prescriptions  Medication Sig Dispense Refill  . amiodarone (PACERONE) 200 MG tablet Take 1 tablet (200 mg) twice daily for 1 month. After that, decrease  to 1 tablet (200 mg) daily 90 tablet 1  . atorvastatin (LIPITOR) 10 MG tablet Take 1 tablet (10 mg total) by mouth daily. 90 tablet 3  . Cholecalciferol (VITAMIN D3) 5000 units CAPS Take 5,000 Units by mouth daily.     . clobetasol ointment (TEMOVATE) 7.56 % Apply 1 application topically 2 (two) times daily. 30 g 0  . co-enzyme Q-10 30 MG capsule Take 30 mg by mouth 2 (two) times daily.     [name] denosumab (PROLIA) 60 MG/ML SOLN injection Inject 60 mg into the skin every 6 (six) months. Administer in upper arm, thigh, or abdomen    . ELIQUIS 5 MG TABS tablet Take 1 tablet (5 mg total) by mouth 2 (two) times daily. 180 tablet 3  . Ferrous Sulfate (IRON) 325 (65 Fe) MG TABS Take 325 mg by mouth daily with breakfast.     . folic acid (FOLVITE) 433 MCG tablet Take 800 mcg by mouth daily.    . furosemide (LASIX) 20 MG tablet Take 1 tablet (20 mg total) by mouth as directed. Take as directed (Patient taking differently: Take 20 mg by mouth daily as needed for fluid or edema. ) 20 tablet 0  . gentamicin cream (GARAMYCIN) 0.1 % Apply 1 application topically 3 (three) times daily. (Patient taking differently: Apply 1 application topically 3 (three) times daily. TO AFFECTED AREA(S) ON LEG(S)) 30 g 1  . loperamide (IMODIUM) 2 MG capsule Take 2 mg by mouth See admin instructions. After AM bowel movement (as needed for leakage)    .  meclizine (ANTIVERT) 12.5 MG tablet Take 12.5 mg by mouth 2 (two) times daily.    . metoprolol tartrate (LOPRESSOR) 50 MG tablet Take 0.5 tablets (25 mg total) by mouth 2 (two) times daily. 90 tablet 3  . modafinil (PROVIGIL) 200 MG tablet TAKE ONE TABLET BY MOUTH TWICE DAILY 60 tablet 5  . nitrofurantoin (MACRODANTIN) 100 MG capsule Take 1 capsule (100 mg total) by mouth at bedtime. To prevent UTI 90 capsule 1  . ondansetron (ZOFRAN-ODT) 8 MG disintegrating tablet Take 1 tablet (8 mg total) by mouth every 8 (eight) hours as needed for nausea. 20 tablet 3  . potassium  chloride (K-DUR) 10 MEQ tablet Take 1 tablet (10 mEq total) by mouth daily as needed. When you take Lasix 15 tablet 1  . pramipexole (MIRAPEX) 0.25 MG tablet Take 1 tablet (0.25 mg total) by mouth 2 (two) times daily. 180 tablet 3   No current facility-administered medications for this visit.     Allergies:   Vancomycin; Penicillins; and Tape   Social History:  The patient  reports that she has quit smoking. She has a 55.00 pack-year smoking history. She has never used smokeless tobacco. She reports that she does not drink alcohol or use drugs.   Family History:  The patient's family history includes Bladder Cancer in her brother; Diabetes in her father; Emphysema in her father and mother; Heart attack in her maternal grandmother; Heart disease in her father; Hypertension in her brother, brother, and sister; Kidney failure in her paternal grandmother; Stroke in her paternal grandfather.    ROS:  Please see the history of present illness.   Otherwise, review of systems is positive for none.   All other systems are reviewed and negative.   PHYSICAL EXAM: VS:  BP (!) 147/84   Pulse (!) 59   Temp 97.7 F (36.5 C) (Oral)   Ht 5\' 4"  (1.626 m)   Wt 145 lb (65.8 kg)   SpO2 99%   BMI 24.89 kg/m  , BMI Body mass index is 24.89 kg/m. GEN: Well nourished, well developed, in no acute distress  HEENT: normal  Neck: no JVD, carotid bruits, or masses Cardiac: RRR; no murmurs, rubs, or gallops,no edema  Respiratory:  clear to auscultation bilaterally, normal work of breathing GI: soft, nontender, nondistended, + BS MS: no deformity or atrophy  Skin: warm and dry Neuro:  Strength and sensation are intact Psych: euthymic mood, full affect  Recent Labs: 10/17/2016: Brain Natriuretic Peptide 238.4; TSH 2.53 04/20/2017: ALT 17 05/16/2017: BUN 15; Creatinine, Ser 0.76; Hemoglobin 13.1; Platelets 281; Potassium 4.7; Sodium 141    Lipid Panel  Labs(Brief)          Component Value  Date/Time   CHOL 214 (H) 01/18/2017 0833   TRIG 81 01/18/2017 0833   HDL 109 01/18/2017 0833   CHOLHDL 2.0 01/18/2017 0833   VLDL 16 01/18/2017 0833   LDLCALC 89 01/18/2017 0833          Wt Readings from Last 3 Encounters:  06/27/17 148 lb 6.4 oz (67.3 kg)  05/04/17 149 lb 6.4 oz (67.8 kg)  04/27/17 148 lb (67.1 kg)      Other studies Reviewed: Additional studies/ records that were reviewed today include: TTE 10/26/16  Review of the above records today demonstrates:  - Left ventricle: The cavity size was normal. Wall thickness was increased in a pattern of mild LVH. Systolic function was normal. The estimated ejection fraction was in the range of 55% to  60%. Wall motion was normal; there were no regional wall motion abnormalities. Features are consistent with a pseudonormal left ventricular filling pattern, with concomitant abnormal relaxation and increased filling pressure (grade 2 diastolic dysfunction). - Aortic valve: Trileaflet; moderately calcified leaflets. Sclerosis without stenosis. - Mitral valve: Moderately calcified annulus. Mildly calcified leaflets . There was trivial regurgitation. - Left atrium: The atrium was moderately dilated. - Right ventricle: The cavity size was mildly dilated. Pacer wire or catheter noted in right ventricle. Systolic function was normal. - Right atrium: The atrium was moderately dilated. - Tricuspid valve: Peak RV-RA gradient (S): 26 mm Hg. - Pulmonary arteries: PA peak pressure: 29 mm Hg (S). - Inferior vena cava: The vessel was normal in size. The respirophasic diameter changes were in the normal range (>= 50%), consistent with normal central venous pressure.   ASSESSMENT AND PLAN:  1.  Atrial fibrillation/flutter: Plan for ablation. Has been compliant with eliquis. Risks and benefits discussed. Risks include but not limited to bleeding, tamponade, heart block, stroke, and damage to  surrounding organs. She understands the risks and has agreed to the procedure.  This patients CHA2DS2-VASc Score and unadjusted Ischemic Stroke Rate (% per year) is equal to 4.8 % stroke rate/year from a score of 4  Above score calculated as 1 point each if present [CHF, HTN, DM, Vascular=MI/PAD/Aortic Plaque, Age if 65-74, or Female] Above score calculated as 2 points each if present [Age > 75, or Stroke/TIA/TE]   2. Pacemaker: Leads stable. No changes at this time.  3. Hypertension: Blood pressure stable. No changes at this time.  4. Diastolic heart failure: Volume status stable.  5. Coronary artery disease: Currently feeling well without chest pain.

## 2017-08-10 NOTE — Anesthesia Preprocedure Evaluation (Signed)
Anesthesia Evaluation  Patient identified by MRN, date of birth, ID band Patient awake    Reviewed: Allergy & Precautions, NPO status , Patient's Chart, lab work & pertinent test results, reviewed documented beta blocker date and time   Airway Mallampati: I  TM Distance: >3 FB Neck ROM: Full    Dental   Pulmonary former smoker,    Pulmonary exam normal        Cardiovascular hypertension, Pt. on medications and Pt. on home beta blockers + CAD  Normal cardiovascular exam+ pacemaker      Neuro/Psych    GI/Hepatic   Endo/Other    Renal/GU      Musculoskeletal   Abdominal   Peds  Hematology   Anesthesia Other Findings   Reproductive/Obstetrics                             Anesthesia Physical  Anesthesia Plan  ASA: III  Anesthesia Plan: General   Post-op Pain Management:    Induction: Intravenous  PONV Risk Score and Plan: 3 and Propofol, Treatment may vary due to age or medical condition and Ondansetron  Airway Management Planned: Oral ETT  Additional Equipment:   Intra-op Plan:   Post-operative Plan: Extubation in OR  Informed Consent: I have reviewed the patients History and Physical, chart, labs and discussed the procedure including the risks, benefits and alternatives for the proposed anesthesia with the patient or authorized representative who has indicated his/her understanding and acceptance.     Plan Discussed with: Surgeon and CRNA  Anesthesia Plan Comments:         Anesthesia Quick Evaluation

## 2017-08-10 NOTE — Progress Notes (Signed)
Pt ambulated around room at end of bedrest. Left and Right groin sites a level "0" before and after ambulation. Pt denied any pain. Will continue to assess and monitor.  Tressie Ellis, RN

## 2017-08-10 NOTE — Discharge Summary (Signed)
ELECTROPHYSIOLOGY PROCEDURE DISCHARGE SUMMARY    Patient ID: Donna [name],  MRN: [id number], DOB/AGE: 74-Nov-1942 74 y.o.  Admit date: 08/10/2017 Discharge date: 08/11/2017  Primary Care Physician: [name], DO Electrophysiologist: [facility]  Primary Discharge Diagnosis:  Persistent atrial fibrillation status post ablation this admission  Secondary Discharge Diagnosis:  1.  CAD 2.  Hyperlipidemia 3.  Tachy/brady syndrome s/p [employer] PPM   Procedures This Admission:  1.  Electrophysiology study and radiofrequency catheter ablation on 08/10/17 by Dr Curt Bears.  See op note for full details. There were no early apparent complications.   Brief HPI: Donna [name] is a 76 y.o. female with a history of persistent atrial fibrillation.  They have failed medical therapy with amiodarone. Risks, benefits, and alternatives to catheter ablation of atrial fibrillation were reviewed with the patient who wished to proceed.  The patient underwent cardiac CT prior to the procedure which demonstrated no LAA thrombus.    Hospital Course:  The patient was admitted and underwent EPS/RFCA of atrial fibrillation with details as outlined above.  They were monitored on telemetry overnight which demonstrated sinus rhythm.  Groin was without complication on the day of discharge.  The patient was examined and considered to be stable for discharge.  Wound care and restrictions were reviewed with the patient.  The patient will be seen back by Roderic Palau, NP in 4 weeks and Dr Curt Bears in 12 weeks for post ablation follow up.   This patients CHA2DS2-VASc Score and unadjusted Ischemic Stroke Rate (% per year) is equal to 3.2 % stroke rate/year from a score of 3 Above score calculated as 1 point each if present [CHF, HTN, DM, Vascular=MI/PAD/Aortic Plaque, Age if 65-74, or Female] Above score calculated as 2 points each if present [Age > 75, or Stroke/TIA/TE]   Physical Exam: Vitals:     08/10/17 2358 08/11/17 0332 08/11/17 0756 08/11/17 0853  BP: (!) 107/52 107/69 (!) 100/56 103/62  Pulse: 60 65 63 69  Resp: 10 15    Temp: 97.8 F (36.6 C) 97.9 F (36.6 C) 98.2 F (36.8 C)   TempSrc: Oral Oral Oral   SpO2: 97% 98% 99%   Weight:  147 lb 4.8 oz (66.8 kg)    Height:        GEN- The patient is well appearing, alert and oriented x 3 today.   HEENT: normocephalic, atraumatic; sclera clear, conjunctiva pink; hearing intact; oropharynx clear; neck supple  Lungs- Clear to ausculation bilaterally, normal work of breathing.  No wheezes, rales, rhonchi Heart- Regular rate and rhythm, no murmurs, rubs or gallops  GI- soft, non-tender, non-distended, bowel sounds present  Extremities- no clubbing, cyanosis, or edema; DP/PT/radial pulses 2+ bilaterally, groin without hematoma/bruit MS- no significant deformity or atrophy Skin- warm and dry, no rash or lesion Psych- euthymic mood, full affect Neuro- strength and sensation are intact   Labs:   Lab Results  Component Value Date   WBC 6.0 08/01/2017   HGB 13.9 08/01/2017   HCT 42.3 08/01/2017   MCV 88 08/01/2017   PLT 229 08/01/2017    Discharge Medications:  Allergies as of 08/11/2017      Reactions   Penicillins Hives   Has patient had a PCN reaction causing immediate rash, facial/tongue/throat swelling, SOB or lightheadedness with hypotension: Yes Has patient had a PCN reaction causing severe rash involving mucus membranes or skin necrosis: No Has patient had a PCN reaction that required hospitalization: No Has patient had a PCN reaction occurring within  the last 10 years: No If all of the above answers are "NO", then may proceed with Cephalosporin use.   Vancomycin Anaphylaxis   Tape Rash      Medication List    TAKE these medications   amiodarone 200 MG tablet Commonly known as:  PACERONE Take 1 tablet (200 mg total) by mouth daily.   atorvastatin 10 MG tablet Commonly known as:  LIPITOR Take 10 mg by  mouth at bedtime.   co-enzyme Q-10 30 MG capsule Take 30 mg by mouth 2 (two) times daily.   denosumab 60 MG/ML Soln injection Commonly known as:  PROLIA Inject 60 mg into the skin every 6 (six) months. Administer in upper arm, thigh, or abdomen   ELIQUIS 5 MG Tabs tablet Generic drug:  apixaban Take 1 tablet (5 mg total) by mouth 2 (two) times daily.   folic acid 944 MCG tablet Commonly known as:  FOLVITE Take 800 mcg by mouth daily.   furosemide 20 MG tablet Commonly known as:  LASIX Take 1 tablet (20 mg total) by mouth as directed. Take as directed What changed:  when to take this  reasons to take this  additional instructions   Iron 325 (65 Fe) MG Tabs Take 325 mg by mouth daily with breakfast.   loperamide 2 MG capsule Commonly known as:  IMODIUM Take 2 mg by mouth See admin instructions. After AM bowel movement (as needed for leakage)   meclizine 12.5 MG tablet Commonly known as:  ANTIVERT TAKE ONE TABLET BY MOUTH THREE TIMES DAILY AS NEEDED FOR DIZZINESS What changed:  See the new instructions.   metoprolol tartrate 50 MG tablet Commonly known as:  LOPRESSOR Take 0.5 tablets (25 mg total) by mouth 2 (two) times daily.   modafinil 200 MG tablet Commonly known as:  PROVIGIL TAKE ONE TABLET BY MOUTH TWICE DAILY   nitrofurantoin 100 MG capsule Commonly known as:  MACRODANTIN Take 1 capsule (100 mg total) by mouth at bedtime. To prevent UTI   ondansetron 8 MG disintegrating tablet Commonly known as:  ZOFRAN-ODT Take 1 tablet (8 mg total) by mouth every 8 (eight) hours as needed for nausea.   potassium chloride 10 MEQ tablet Commonly known as:  K-DUR Take 1 tablet (10 mEq total) by mouth daily as needed. When you take Lasix   pramipexole 0.25 MG tablet Commonly known as:  MIRAPEX Take 1 tablet (0.25 mg total) by mouth 2 (two) times daily.   Vitamin D3 5000 units Caps Take 5,000 Units by mouth daily.     Disposition:   Follow-up Information     [facility] Follow up on 09/12/2017.   Specialty:  Cardiology Why:  at 11:30AM Contact information: [address] [id number] [place] [phone]          Duration of Discharge Encounter: Greater than 30 minutes including physician time.  Signed, Murray Hodgkins, NP 08/11/2017 9:01 AM  EP Attending  Patient seen and examined. Agree with above. The patient is stable after her atrial fib ablation and will continue her current meds with followup as noted above.   Mikle Bosworth.D.

## 2017-08-11 ENCOUNTER — Encounter (HOSPITAL_COMMUNITY): Payer: Self-pay | Admitting: Nurse Practitioner

## 2017-08-11 ENCOUNTER — Other Ambulatory Visit: Payer: Self-pay

## 2017-08-11 DIAGNOSIS — G47419 Narcolepsy without cataplexy: Secondary | ICD-10-CM | POA: Diagnosis not present

## 2017-08-11 DIAGNOSIS — G2581 Restless legs syndrome: Secondary | ICD-10-CM | POA: Diagnosis not present

## 2017-08-11 DIAGNOSIS — I481 Persistent atrial fibrillation: Secondary | ICD-10-CM | POA: Diagnosis not present

## 2017-08-11 DIAGNOSIS — Z881 Allergy status to other antibiotic agents status: Secondary | ICD-10-CM | POA: Diagnosis not present

## 2017-08-11 DIAGNOSIS — E785 Hyperlipidemia, unspecified: Secondary | ICD-10-CM | POA: Diagnosis not present

## 2017-08-11 DIAGNOSIS — I5030 Unspecified diastolic (congestive) heart failure: Secondary | ICD-10-CM | POA: Diagnosis not present

## 2017-08-11 DIAGNOSIS — I480 Paroxysmal atrial fibrillation: Secondary | ICD-10-CM | POA: Diagnosis not present

## 2017-08-11 DIAGNOSIS — I110 Hypertensive heart disease with heart failure: Secondary | ICD-10-CM | POA: Diagnosis not present

## 2017-08-11 DIAGNOSIS — G4730 Sleep apnea, unspecified: Secondary | ICD-10-CM | POA: Diagnosis not present

## 2017-08-11 DIAGNOSIS — I2510 Atherosclerotic heart disease of native coronary artery without angina pectoris: Secondary | ICD-10-CM | POA: Diagnosis not present

## 2017-08-11 DIAGNOSIS — I252 Old myocardial infarction: Secondary | ICD-10-CM | POA: Diagnosis not present

## 2017-08-11 DIAGNOSIS — Z950 Presence of cardiac pacemaker: Secondary | ICD-10-CM | POA: Diagnosis not present

## 2017-08-11 DIAGNOSIS — E559 Vitamin D deficiency, unspecified: Secondary | ICD-10-CM | POA: Diagnosis not present

## 2017-08-11 MED ORDER — AMIODARONE HCL 200 MG PO TABS: 200.0000 mg | ORAL_TABLET | Freq: Every day | ORAL | Status: DC

## 2017-08-11 NOTE — Discharge Instructions (Signed)
No driving for 4 days. No lifting over 5 lbs for 1 week. No sexual activity for 1 week. You may return to work in 1 week. Keep procedure site clean & dry. If you notice increased pain, swelling, bleeding or pus, call/return!  You may shower, but no soaking baths/hot tubs/pools for 1 week.  ° ° °You have an appointment set up with the At[facility]ibrillation Clinic.  Multiple studies have shown that being followed by a dedicated atrial fibrillation clinic in addition to the standard care you receive from your other physicians improves health. We believe that enrollment in the atrial fibrillation clinic will allow us to better care for you.  ° °The phone number to the [facility]ibrillation Clinic is [phone]. The clinic is staffed Monday through Friday from 8:30am to 5pm. ° °Parking Directions: The clinic is located in the Heart and Vascular Building connected to [facility]. °1)From [address] turn on to [address] and go to the 3rd entrance  (Heart and Vascular entrance) on the right. °2)Look to the right for Heart &Vascular Parking Garage. °3)A code for the entrance is required please call the clinic to receive this.   °4)Take the elevators to the 1st floor. Registration is in the room with the glass walls at the end of the hallway. ° °If you have any trouble parking or locating the clinic, please don’t hesitate to call [phone]. ° ° °

## 2017-08-11 NOTE — Care Management Note (Signed)
Case Management Note  Patient Details  Name: Donna [name] MRN: [id number] Date of Birth: 1941/07/12  Subjective/Objective:    Patient for dc today, has PCP and med coverage, no needs.                Action/Plan:   Expected Discharge Date:  08/11/17               Expected Discharge Plan:  Home/Self Care  In-House Referral:     Discharge planning Services  CM Consult  Post Acute Care Choice:    Choice offered to:     DME Arranged:    DME Agency:     HH Arranged:    HH Agency:     Status of Service:  Completed, signed off  If discussed at [employer] of Stay Meetings, dates discussed:    Additional Comments:  Zenon Mayo, RN 08/11/2017, 9:58 AM

## 2017-08-13 ENCOUNTER — Encounter (HOSPITAL_COMMUNITY): Payer: Self-pay | Admitting: Cardiology

## 2017-08-15 ENCOUNTER — Encounter: Payer: Self-pay | Admitting: Osteopathic Medicine

## 2017-08-15 ENCOUNTER — Ambulatory Visit (INDEPENDENT_AMBULATORY_CARE_PROVIDER_SITE_OTHER): Payer: Medicare Other | Admitting: Osteopathic Medicine

## 2017-08-15 VITALS — BP 119/71 | HR 97 | Temp 98.1°F | Wt 151.0 lb

## 2017-08-15 DIAGNOSIS — R5382 Chronic fatigue, unspecified: Secondary | ICD-10-CM | POA: Diagnosis not present

## 2017-08-15 DIAGNOSIS — Z23 Encounter for immunization: Secondary | ICD-10-CM

## 2017-08-15 DIAGNOSIS — Z8679 Personal history of other diseases of the circulatory system: Secondary | ICD-10-CM | POA: Diagnosis not present

## 2017-08-15 DIAGNOSIS — N390 Urinary tract infection, site not specified: Secondary | ICD-10-CM | POA: Diagnosis not present

## 2017-08-15 DIAGNOSIS — Z9889 Other specified postprocedural states: Secondary | ICD-10-CM | POA: Diagnosis not present

## 2017-08-15 MED ORDER — ATORVASTATIN CALCIUM 10 MG PO TABS: 10.0000 mg | ORAL_TABLET | Freq: Every day | ORAL | 3 refills | Status: DC

## 2017-08-15 MED ORDER — METOPROLOL TARTRATE 25 MG PO TABS: 25.0000 mg | ORAL_TABLET | Freq: Two times a day (BID) | ORAL | 3 refills | Status: DC

## 2017-08-15 NOTE — Patient Instructions (Addendum)
For exercise - duration (how long), intensity (how hard/strenuous), frequency (how often) - increase one of these at a time.   We can do labs at your annual, sooner if needed  Would hold Metoprolol if home BP top number under 110. Talk to cardiology team about whether you need to continue this medication.

## 2017-08-15 NOTE — Progress Notes (Signed)
HPI: Donna [name] is a 76 y.o. female  who presents to [place] today, 08/15/17,  for chief complaint of:  Chief Complaint  Patient presents with  . Follow-up    urinary issues    This, patient has been following with cardiology/electrophysiology for care of atrial fibrillation/flutter. Underwent 2 cardioversions and more recently 1 ablation on 08/10/2017. She is due to follow-up with electrophysiology in the next few weeks.  For the past few months, she has noticed significant tiredness/fatigue. She has been unable to do much in the way of physical activity since any exertion would cause a significant elevation in her heart rate. She has noticed less of this since the ablation that she is still feeling quite tired and easily fatigued.  Has been on prophylactic antibiotics for recurrent urinary tract infection, daily nitrofurantoin for about the past 6 months or so. No increased urinary frequency or dysuria/hematuria.  Recent records/labs reviewed. Normal electrolytes and blood counts, renal function, prior to ablation.  Several months ago we checked other causes for fatigue such as vitamin D, thyroid. All okay.  Has not noticed any hypoglycemia.  Home blood pressure measurements tend to fluctuate, going down as low as systolic 42P or as high as systolic 536R or 443X. She has questions about whether she needs to be taking the metoprolol and whether the dose she is on is appropriate.    Past medical, surgical, social and family history reviewed: Patient Active Problem List   Diagnosis Date Noted  . AF (atrial fibrillation) ([place]) 08/10/2017  . Atrial flutter ([place])   . Osteoporosis 03/20/2017  . Ischemic changes on head CT 03/01/2017  . Pacemaker 03/01/2017  . Prediabetes 03/01/2017  . Recurrent UTI 01/29/2017  . Mucinous cyst of left thumb 12/18/2016  . Chronic idiopathic constipation 12/08/2016  . Chronic diastolic congestive heart  failure ([place]) 11/08/2016  . Benign paroxysmal positional vertigo 10/18/2016  . Lower extremity edema 10/18/2016  . Acute pyelonephritis, right 10/18/2016  . Vitamin D deficiency 10/18/2016  . History of anemia 10/18/2016  . Restless leg syndrome 10/17/2016  . CAD (coronary atherosclerotic disease) 10/17/2016  . Paroxysmal atrial fibrillation ([place]) 10/17/2016  . History of gastric bypass 10/17/2016  . Hyperlipidemia 10/17/2016  . Sleep disorder 10/17/2016  . Tobacco dependence 10/17/2016   Past Surgical History:  Procedure Laterality Date  . ATRIAL FIBRILLATION ABLATION N/A 08/10/2017   Procedure: Atrial Fibrillation Ablation;  Surgeon: [name], MD;  Location: [facility];  Service: Cardiovascular;  Laterality: N/A;  . CARDIAC CATHETERIZATION    . CARDIOVERSION N/A 04/27/2017   Procedure: CARDIOVERSION;  Surgeon: [name], MD;  Location: [facility] ENDOSCOPY;  Service: Cardiovascular;  Laterality: N/A;  . CARDIOVERSION N/A 05/24/2017   Procedure: CARDIOVERSION;  Surgeon: [name], MD;  Location: [facility] ENDOSCOPY;  Service: Cardiovascular;  Laterality: N/A;   Social History  Substance Use Topics  . Smoking status: Former Smoker    Packs/day: 1.00    Years: 55.00  . Smokeless tobacco: Never Used  . Alcohol use No   Family History  Problem Relation Age of Onset  . Emphysema Mother   . Heart disease Father   . Diabetes Father   . Emphysema Father   . Hypertension Sister   . Bladder Cancer Brother   . Hypertension Brother   . Heart attack Maternal Grandmother   . Kidney failure Paternal Grandmother   . Stroke Paternal Grandfather   . Hypertension Brother  Current medication list and allergy/intolerance information reviewed:   Current Outpatient Prescriptions  Medication Sig Dispense Refill  . amiodarone (PACERONE) 200 MG tablet Take 1 tablet (200 mg total) by mouth daily.    [name] atorvastatin (LIPITOR) 10 MG tablet Take 10 mg by mouth at bedtime.    .  Cholecalciferol (VITAMIN D3) 5000 units CAPS Take 5,000 Units by mouth daily.     [name] co-enzyme Q-10 30 MG capsule Take 30 mg by mouth 2 (two) times daily.     [name] denosumab (PROLIA) 60 MG/ML SOLN injection Inject 60 mg into the skin every 6 (six) months. Administer in upper arm, thigh, or abdomen    . ELIQUIS 5 MG TABS tablet Take 1 tablet (5 mg total) by mouth 2 (two) times daily. 180 tablet 3  . Ferrous Sulfate (IRON) 325 (65 Fe) MG TABS Take 325 mg by mouth daily with breakfast.     . folic acid (FOLVITE) 914 MCG tablet Take 800 mcg by mouth daily.    . furosemide (LASIX) 20 MG tablet Take 1 tablet (20 mg total) by mouth as directed. Take as directed (Patient taking differently: Take 20 mg by mouth daily as needed for fluid or edema. ) 20 tablet 0  . loperamide (IMODIUM) 2 MG capsule Take 2 mg by mouth See admin instructions. After AM bowel movement (as needed for leakage)    . meclizine (ANTIVERT) 12.5 MG tablet TAKE ONE TABLET BY MOUTH THREE TIMES DAILY AS NEEDED FOR DIZZINESS (Patient taking differently: TAKE ONE TABLET BY MOUTH TWICE A DAY, every day.) 90 tablet 3  . metoprolol tartrate (LOPRESSOR) 50 MG tablet Take 0.5 tablets (25 mg total) by mouth 2 (two) times daily. 90 tablet 3  . modafinil (PROVIGIL) 200 MG tablet TAKE ONE TABLET BY MOUTH TWICE DAILY 60 tablet 5  . ondansetron (ZOFRAN-ODT) 8 MG disintegrating tablet Take 1 tablet (8 mg total) by mouth every 8 (eight) hours as needed for nausea. 20 tablet 3  . potassium chloride (K-DUR) 10 MEQ tablet Take 1 tablet (10 mEq total) by mouth daily as needed. When you take Lasix 15 tablet 1  . pramipexole (MIRAPEX) 0.25 MG tablet Take 1 tablet (0.25 mg total) by mouth 2 (two) times daily. 180 tablet 3   No current facility-administered medications for this visit.    Allergies  Allergen Reactions  . Penicillins Hives    Has patient had a PCN reaction causing immediate rash, facial/tongue/throat swelling, SOB or lightheadedness with  hypotension: Yes Has patient had a PCN reaction causing severe rash involving mucus membranes or skin necrosis: No Has patient had a PCN reaction that required hospitalization: No Has patient had a PCN reaction occurring within the last 10 years: No If all of the above answers are "NO", then may proceed with Cephalosporin use.   . Vancomycin Anaphylaxis  . Tape Rash      Review of Systems:  Constitutional:  No  fever, no chills, No unintentional weight changes. +significant fatigue.   HEENT: No  headache, no vision change  Cardiac: No  chest pain, No  pressure, No palpitations, No  Orthopnea  Respiratory:  No  shortness of breath. No  Cough  Gastrointestinal: No  abdominal pain, No  nausea, No  vomiting,  No  blood in stool, No  diarrhea, No  constipation   Musculoskeletal: No new myalgia/arthralgia  Genitourinary: No Dysuria/hematuria/frequency. abnormal genital bleeding, No abnormal genital discharge  Skin: No  Rash  Neurologic: + Generalized but no focal  weakness, No  dizziness   Exam:  BP 119/71   Pulse 97   Temp 98.1 F (36.7 C) (Oral)   Wt 151 lb (68.5 kg)   BMI 25.92 kg/m   Constitutional: VS see above. General Appearance: alert, well-developed, well-nourished, NAD  Eyes: Normal lids and conjunctive, non-icteric sclera  Ears, Nose, Mouth, Throat: MMM, Normal external inspection ears/nares/mouth/lips/gums.   Neck: No masses, trachea midline. No thyroid enlargement. No tenderness/mass appreciated. No lymphadenopathy  Respiratory: Normal respiratory effort. no wheeze, no rhonchi, no rales  Cardiovascular: S1/S2 normal, no murmur, no rub/gallop auscultated. RRR. No lower extremity edema.   Musculoskeletal: Gait normal.   Neurological: Normal balance/coordination. No tremor.   Skin: warm, dry, intact. No rash/ulcer.     Psychiatric: Normal judgment/insight. Normal mood and affect. Oriented x3.     ASSESSMENT/PLAN:   Status post ablation of atrial  fibrillation - We discussed medication regimen with cardiology team, may need to consider discontinuing metoprolol or changing to other.  - Plan: atorvastatin (LIPITOR) 10 MG tablet, metoprolol tartrate (LOPRESSOR) 25 MG tablet, DISCONTINUED: metoprolol tartrate (LOPRESSOR) 25 MG tablet  Need for immunization against influenza - Plan: Flu vaccine HIGH DOSE PF  Recurrent UTI - Plan: Urine Culture, Urinalysis, Routine w reflex microscopic  Chronic fatigue - Likely multifactorial, deconditioning. Patient declines additional labs today. Discussed slow increase of exercise, contact me/cardiology if any issues    Patient Instructions  For exercise - duration (how long), intensity (how hard/strenuous), frequency (how often) - increase one of these at a time.   We can do labs at your annual, sooner if needed  Would hold Metoprolol if home BP top number under 110. Talk to cardiology team about whether you need to continue this medication.     Visit summary with medication list and pertinent instructions was printed for patient to review. All questions at time of visit were answered - patient instructed to contact office with any additional concerns. ER/RTC precautions were reviewed with the patient. Follow-up plan: Return for scheduled annual visit in 09/2017.  Note: Total time spent 25 minutes, greater than 50% of the visit was spent face-to-face counseling and coordinating care for the following: The primary encounter diagnosis was Status post ablation of atrial fibrillation. Diagnoses of Need for immunization against influenza, Recurrent UTI, and Chronic fatigue were also pertinent to this visit.[name]

## 2017-08-22 DIAGNOSIS — N390 Urinary tract infection, site not specified: Secondary | ICD-10-CM | POA: Diagnosis not present

## 2017-08-23 LAB — URINALYSIS, ROUTINE W REFLEX MICROSCOPIC
BILIRUBIN URINE: NEGATIVE
GLUCOSE, UA: NEGATIVE
Hyaline Cast: NONE SEEN /LPF
Ketones, ur: NEGATIVE
NITRITE: NEGATIVE
PROTEIN: NEGATIVE
Specific Gravity, Urine: 1.01 (ref 1.001–1.03)
pH: 7 (ref 5.0–8.0)

## 2017-08-23 MED ORDER — SULFAMETHOXAZOLE-TRIMETHOPRIM 800-160 MG PO TABS: 1.0000 | ORAL_TABLET | Freq: Two times a day (BID) | ORAL | 0 refills | Status: DC

## 2017-08-23 NOTE — Addendum Note (Signed)
Addended by: Maryla Morrow on: 08/23/2017 09:32 AM   Modules accepted: Orders

## 2017-08-24 LAB — URINE CULTURE
MICRO NUMBER:: 81098209
SPECIMEN QUALITY:: ADEQUATE

## 2017-09-10 ENCOUNTER — Ambulatory Visit (HOSPITAL_COMMUNITY): Payer: Medicare Other | Admitting: Nurse Practitioner

## 2017-09-12 ENCOUNTER — Ambulatory Visit (HOSPITAL_COMMUNITY)
Admission: RE | Admit: 2017-09-12 | Discharge: 2017-09-12 | Disposition: A | Discharge status: Home / Self Care | Payer: Medicare Other | Source: Ambulatory Visit | Attending: Nurse Practitioner | Admitting: Nurse Practitioner

## 2017-09-12 ENCOUNTER — Encounter (HOSPITAL_COMMUNITY): Payer: Self-pay | Admitting: Nurse Practitioner

## 2017-09-12 VITALS — BP 126/78 | HR 60 | Ht 64.0 in | Wt 150.0 lb

## 2017-09-12 DIAGNOSIS — Z881 Allergy status to other antibiotic agents status: Secondary | ICD-10-CM | POA: Insufficient documentation

## 2017-09-12 DIAGNOSIS — Z87891 Personal history of nicotine dependence: Secondary | ICD-10-CM | POA: Insufficient documentation

## 2017-09-12 DIAGNOSIS — Z9889 Other specified postprocedural states: Secondary | ICD-10-CM

## 2017-09-12 DIAGNOSIS — E785 Hyperlipidemia, unspecified: Secondary | ICD-10-CM | POA: Insufficient documentation

## 2017-09-12 DIAGNOSIS — Z836 Family history of other diseases of the respiratory system: Secondary | ICD-10-CM | POA: Diagnosis not present

## 2017-09-12 DIAGNOSIS — Z7902 Long term (current) use of antithrombotics/antiplatelets: Secondary | ICD-10-CM | POA: Diagnosis not present

## 2017-09-12 DIAGNOSIS — Z8052 Family history of malignant neoplasm of bladder: Secondary | ICD-10-CM | POA: Diagnosis not present

## 2017-09-12 DIAGNOSIS — Z9109 Other allergy status, other than to drugs and biological substances: Secondary | ICD-10-CM | POA: Diagnosis not present

## 2017-09-12 DIAGNOSIS — Z833 Family history of diabetes mellitus: Secondary | ICD-10-CM | POA: Insufficient documentation

## 2017-09-12 DIAGNOSIS — Z8249 Family history of ischemic heart disease and other diseases of the circulatory system: Secondary | ICD-10-CM | POA: Diagnosis not present

## 2017-09-12 DIAGNOSIS — Z8679 Personal history of other diseases of the circulatory system: Secondary | ICD-10-CM | POA: Diagnosis not present

## 2017-09-12 DIAGNOSIS — I252 Old myocardial infarction: Secondary | ICD-10-CM | POA: Diagnosis not present

## 2017-09-12 DIAGNOSIS — G2581 Restless legs syndrome: Secondary | ICD-10-CM | POA: Diagnosis not present

## 2017-09-12 DIAGNOSIS — H8110 Benign paroxysmal vertigo, unspecified ear: Secondary | ICD-10-CM | POA: Insufficient documentation

## 2017-09-12 DIAGNOSIS — Z880 Allergy status to penicillin: Secondary | ICD-10-CM | POA: Diagnosis not present

## 2017-09-12 DIAGNOSIS — Z9884 Bariatric surgery status: Secondary | ICD-10-CM | POA: Diagnosis not present

## 2017-09-12 DIAGNOSIS — G4730 Sleep apnea, unspecified: Secondary | ICD-10-CM | POA: Diagnosis not present

## 2017-09-12 DIAGNOSIS — I480 Paroxysmal atrial fibrillation: Secondary | ICD-10-CM | POA: Diagnosis not present

## 2017-09-12 DIAGNOSIS — I4891 Unspecified atrial fibrillation: Secondary | ICD-10-CM | POA: Insufficient documentation

## 2017-09-12 DIAGNOSIS — I2510 Atherosclerotic heart disease of native coronary artery without angina pectoris: Secondary | ICD-10-CM | POA: Diagnosis not present

## 2017-09-12 DIAGNOSIS — Z79899 Other long term (current) drug therapy: Secondary | ICD-10-CM | POA: Diagnosis not present

## 2017-09-12 DIAGNOSIS — E559 Vitamin D deficiency, unspecified: Secondary | ICD-10-CM | POA: Insufficient documentation

## 2017-09-12 DIAGNOSIS — Z841 Family history of disorders of kidney and ureter: Secondary | ICD-10-CM | POA: Insufficient documentation

## 2017-09-12 DIAGNOSIS — Z823 Family history of stroke: Secondary | ICD-10-CM | POA: Insufficient documentation

## 2017-09-12 MED ORDER — METOPROLOL TARTRATE 25 MG PO TABS: 12.5000 mg | ORAL_TABLET | Freq: Two times a day (BID) | ORAL | 3 refills | Status: DC

## 2017-09-12 NOTE — Patient Instructions (Signed)
Your physician has recommended you make the following change in your medication: 1)Decrease metoprolol to 12.5mg  twice a day (1/2 tablet of your 25mg  tablet twice a day)

## 2017-09-12 NOTE — Progress Notes (Signed)
Primary Care Physician: [name], DO Referring Physician: Dr. Curt Bears via Trinidad Curet, RN   Donna [name] is a 76 y.o. female with a h/o, PPM, persistent afib, pt of Dr. Curt Bears that had failed flecainide and most recently on amiodarone. She had an ablation one month ago after having increased afib burden. Has not noted any afib since ablation but still feels tired. No swallowing or groin issues.   Today, she denies symptoms of palpitations, chest pain, shortness of breath, orthopnea, PND, lower extremity edema, dizziness, presyncope, syncope, or neurologic sequela. The patient is tolerating medications without difficulties and is otherwise without complaint today.   Past Medical History:  Diagnosis Date  . Atrial fibrillation ([place]) 10/17/2016   a. On Metoprolol, Eliquis, amio;  b. 07/2017 s/p RFCA.  [name] Benign paroxysmal positional vertigo 10/18/2016  . CAD (coronary atherosclerotic disease) 10/17/2016   Hx MI  . Cystitis 10/18/2016  . Dysuria 10/18/2016  . History of anemia 10/18/2016  . History of gastric bypass 10/17/2016  . Hyperlipidemia 10/17/2016  . Lower extremity edema 10/18/2016  . Restless leg syndrome 10/17/2016  . Sleep disorder 10/17/2016   Modafinil for sleep apnea and narcolepsy  . Tobacco dependence 10/17/2016  . Urinary frequency 10/18/2016  . Vitamin D deficiency 10/18/2016   Past Surgical History:  Procedure Laterality Date  . ATRIAL FIBRILLATION ABLATION N/A 08/10/2017   Procedure: Atrial Fibrillation Ablation;  Surgeon: [name], MD;  Location: [facility];  Service: Cardiovascular;  Laterality: N/A;  . CARDIAC CATHETERIZATION    . CARDIOVERSION N/A 04/27/2017   Procedure: CARDIOVERSION;  Surgeon: [name], MD;  Location: [facility] ENDOSCOPY;  Service: Cardiovascular;  Laterality: N/A;  . CARDIOVERSION N/A 05/24/2017   Procedure: CARDIOVERSION;  Surgeon: [name], MD;  Location: [facility] ENDOSCOPY;  Service:  Cardiovascular;  Laterality: N/A;    Current Outpatient Prescriptions  Medication Sig Dispense Refill  . amiodarone (PACERONE) 200 MG tablet Take 1 tablet (200 mg total) by mouth daily.    [name] atorvastatin (LIPITOR) 10 MG tablet Take 1 tablet (10 mg total) by mouth at bedtime. 90 tablet 3  . Cholecalciferol (VITAMIN D3) 5000 units CAPS Take 5,000 Units by mouth daily.     [name] co-enzyme Q-10 30 MG capsule Take 30 mg by mouth 2 (two) times daily.     [name] denosumab (PROLIA) 60 MG/ML SOLN injection Inject 60 mg into the skin every 6 (six) months. Administer in upper arm, thigh, or abdomen    . ELIQUIS 5 MG TABS tablet Take 1 tablet (5 mg total) by mouth 2 (two) times daily. 180 tablet 3  . Ferrous Sulfate (IRON) 325 (65 Fe) MG TABS Take 325 mg by mouth daily with breakfast.     . folic acid (FOLVITE) 213 MCG tablet Take 800 mcg by mouth daily.    . furosemide (LASIX) 20 MG tablet Take 1 tablet (20 mg total) by mouth as directed. Take as directed (Patient taking differently: Take 20 mg by mouth daily as needed for fluid or edema. ) 20 tablet 0  . loperamide (IMODIUM) 2 MG capsule Take 2 mg by mouth See admin instructions. After AM bowel movement (as needed for leakage)    . meclizine (ANTIVERT) 12.5 MG tablet TAKE ONE TABLET BY MOUTH THREE TIMES DAILY AS NEEDED FOR DIZZINESS (Patient taking differently: TAKE ONE TABLET BY MOUTH TWICE A DAY, every day.) 90 tablet 3  . metoprolol tartrate (LOPRESSOR) 25 MG tablet Take 0.5 tablets (12.5 mg total) by  mouth 2 (two) times daily. 180 tablet 3  . modafinil (PROVIGIL) 200 MG tablet TAKE ONE TABLET BY MOUTH TWICE DAILY 60 tablet 5  . ondansetron (ZOFRAN-ODT) 8 MG disintegrating tablet Take 1 tablet (8 mg total) by mouth every 8 (eight) hours as needed for nausea. 20 tablet 3  . potassium chloride (K-DUR) 10 MEQ tablet Take 1 tablet (10 mEq total) by mouth daily as needed. When you take Lasix 15 tablet 1  . pramipexole (MIRAPEX) 0.25 MG tablet Take 1 tablet (0.25 mg  total) by mouth 2 (two) times daily. 180 tablet 3   No current facility-administered medications for this encounter.     Allergies  Allergen Reactions  . Penicillins Hives    Has patient had a PCN reaction causing immediate rash, facial/tongue/throat swelling, SOB or lightheadedness with hypotension: Yes Has patient had a PCN reaction causing severe rash involving mucus membranes or skin necrosis: No Has patient had a PCN reaction that required hospitalization: No Has patient had a PCN reaction occurring within the last 10 years: No If all of the above answers are "NO", then may proceed with Cephalosporin use.   . Vancomycin Anaphylaxis  . Tape Rash    Social History   Social History  . Marital status: Widowed    Spouse name: N/A  . Number of children: N/A  . Years of education: N/A   Occupational History  . Not on file.   Social History Main Topics  . Smoking status: Former Smoker    Packs/day: 1.00    Years: 55.00  . Smokeless tobacco: Never Used  . Alcohol use No  . Drug use: No  . Sexual activity: No   Other Topics Concern  . Not on file   Social History Narrative  . No narrative on file    Family History  Problem Relation Age of Onset  . Emphysema Mother   . Heart disease Father   . Diabetes Father   . Emphysema Father   . Hypertension Sister   . Bladder Cancer Brother   . Hypertension Brother   . Heart attack Maternal Grandmother   . Kidney failure Paternal Grandmother   . Stroke Paternal Grandfather   . Hypertension Brother     ROS- All systems are reviewed and negative except as per the HPI above  Physical Exam: Vitals:   09/12/17 1051  BP: 126/78  Pulse: 60  Weight: 150 lb (68 kg)  Height: 5\' 4"  (1.626 m)   Wt Readings from Last 3 Encounters:  09/12/17 150 lb (68 kg)  08/15/17 151 lb (68.5 kg)  08/11/17 147 lb 4.8 oz (66.8 kg)    Labs: Lab Results  Component Value Date   NA 141 08/01/2017   K 4.9 08/01/2017   CL 99 08/01/2017     CO2 28 08/01/2017   GLUCOSE 85 08/01/2017   BUN 13 08/01/2017   CREATININE 0.73 08/01/2017   CALCIUM 8.9 08/01/2017   Lab Results  Component Value Date   INR 1.1 05/16/2017   Lab Results  Component Value Date   CHOL 214 (H) 01/18/2017   HDL 109 01/18/2017   LDLCALC 89 01/18/2017   TRIG 81 01/18/2017     GEN- The patient is well appearing, alert and oriented x 3 today.   Head- normocephalic, atraumatic Eyes-  Sclera clear, conjunctiva pink Ears- hearing intact Oropharynx- clear Neck- supple, no JVP Lymph- no cervical lymphadenopathy Lungs- Clear to ausculation bilaterally, normal work of breathing Heart-  regular rate and  rhythm, no murmurs, rubs or gallops, PMI not laterally displaced GI- soft, NT, ND, + BS Extremities- no clubbing, cyanosis, or edema MS- no significant deformity or atrophy Skin- no rash or lesion Psych- euthymic mood, full affect Neuro- strength and sensation are intact  EKG- atrial paced at 60 bpm, pr int 266 ms, qrs int 108 ms, qtc 480 ms Epic records reviewed   Assessment and Plan: 1. Persistent symptomatic afib S/p ablation x one month and has not noted any significant afib burden Feels tired Decrease metoprolol to 25 mg 1/2 tab bid Anticipate coming off amiodarone at 3 month check with Dr. Curt Bears Continue eliquis 5 mg bid for chadsvasc score of  at least 3, reminded no missed doses  F/u with Dr. Curt Bears for device check 10/31  Butch Penny C. Ethon Wymer, [facility] [address] [place], [place] [address] [phone]

## 2017-09-18 NOTE — Progress Notes (Deleted)
Electrophysiology Office Note   Date:  09/18/2017   ID:  Donna, [name] 03-Jan-1941, MRN [id number]  PCP:  [name], DO  Primary Electrophysiologist:  [name], MD    No chief complaint on file.    History of Present Illness: Donna [name] is a 76 y.o. female who presents today for electrophysiology evaluation.   She has a history of atrial fibrillation, and a pacemaker placed for her atrial fibrillation. She has had multiple hospitalizations and cardioversion for her atrial fibrillation. He continued to feel poorly due to her atrial fibrillation. She says that on Monday, she went out of rhythm. She was simply walking to the bathroom and noted that her heart rate was in the 90s. She sat down for a few hours and converted to normal rhythm. She was put on amiodarone 200 mg twice a day when she called in to clinic.  Today, denies symptoms of palpitations, chest pain, shortness of breath, orthopnea, PND, lower extremity edema, claudication, dizziness, presyncope, syncope, bleeding, or neurologic sequela. The patient is tolerating medications without difficulties and is otherwise without complaint today. She does say that she feels significant amounts of fatigue. She also has nausea that she feels is potentially related to the amiodarone. The nausea is worse when she takes amiodarone twice a day.  Past Medical History:  Diagnosis Date  . Atrial fibrillation ([place]) 10/17/2016   a. On Metoprolol, Eliquis, amio;  b. 07/2017 s/p RFCA.  [name] Benign paroxysmal positional vertigo 10/18/2016  . CAD (coronary atherosclerotic disease) 10/17/2016   Hx MI  . Cystitis 10/18/2016  . Dysuria 10/18/2016  . History of anemia 10/18/2016  . History of gastric bypass 10/17/2016  . Hyperlipidemia 10/17/2016  . Lower extremity edema 10/18/2016  . Restless leg syndrome 10/17/2016  . Sleep disorder 10/17/2016   Modafinil for sleep apnea and narcolepsy  . Tobacco dependence  10/17/2016  . Urinary frequency 10/18/2016  . Vitamin D deficiency 10/18/2016   Past Surgical History:  Procedure Laterality Date  . ATRIAL FIBRILLATION ABLATION N/A 08/10/2017   Procedure: Atrial Fibrillation Ablation;  Surgeon: [name], MD;  Location: [place] [facility];  Service: Cardiovascular;  Laterality: N/A;  . CARDIAC CATHETERIZATION    . CARDIOVERSION N/A 04/27/2017   Procedure: CARDIOVERSION;  Surgeon: [name], MD;  Location: [facility] ENDOSCOPY;  Service: Cardiovascular;  Laterality: N/A;  . CARDIOVERSION N/A 05/24/2017   Procedure: CARDIOVERSION;  Surgeon: [name], MD;  Location: [facility] ENDOSCOPY;  Service: Cardiovascular;  Laterality: N/A;     Current Outpatient Prescriptions  Medication Sig Dispense Refill  . amiodarone (PACERONE) 200 MG tablet Take 1 tablet (200 mg total) by mouth daily.    [name] atorvastatin (LIPITOR) 10 MG tablet Take 1 tablet (10 mg total) by mouth at bedtime. 90 tablet 3  . Cholecalciferol (VITAMIN D3) 5000 units CAPS Take 5,000 Units by mouth daily.     [name] co-enzyme Q-10 30 MG capsule Take 30 mg by mouth 2 (two) times daily.     [name] denosumab (PROLIA) 60 MG/ML SOLN injection Inject 60 mg into the skin every 6 (six) months. Administer in upper arm, thigh, or abdomen    . ELIQUIS 5 MG TABS tablet Take 1 tablet (5 mg total) by mouth 2 (two) times daily. 180 tablet 3  . Ferrous Sulfate (IRON) 325 (65 Fe) MG TABS Take 325 mg by mouth daily with breakfast.     . folic acid (FOLVITE) 790 MCG tablet Take 800 mcg by mouth  daily.    . furosemide (LASIX) 20 MG tablet Take 1 tablet (20 mg total) by mouth as directed. Take as directed (Patient taking differently: Take 20 mg by mouth daily as needed for fluid or edema. ) 20 tablet 0  . loperamide (IMODIUM) 2 MG capsule Take 2 mg by mouth See admin instructions. After AM bowel movement (as needed for leakage)    . meclizine (ANTIVERT) 12.5 MG tablet TAKE ONE TABLET BY MOUTH THREE TIMES DAILY AS NEEDED FOR  DIZZINESS (Patient taking differently: TAKE ONE TABLET BY MOUTH TWICE A DAY, every day.) 90 tablet 3  . metoprolol tartrate (LOPRESSOR) 25 MG tablet Take 0.5 tablets (12.5 mg total) by mouth 2 (two) times daily. 180 tablet 3  . modafinil (PROVIGIL) 200 MG tablet TAKE ONE TABLET BY MOUTH TWICE DAILY 60 tablet 5  . ondansetron (ZOFRAN-ODT) 8 MG disintegrating tablet Take 1 tablet (8 mg total) by mouth every 8 (eight) hours as needed for nausea. 20 tablet 3  . potassium chloride (K-DUR) 10 MEQ tablet Take 1 tablet (10 mEq total) by mouth daily as needed. When you take Lasix 15 tablet 1  . pramipexole (MIRAPEX) 0.25 MG tablet Take 1 tablet (0.25 mg total) by mouth 2 (two) times daily. 180 tablet 3   No current facility-administered medications for this visit.     Allergies:   Penicillins; Vancomycin; and Tape   Social History:  The patient  reports that she has quit smoking. She has a 55.00 pack-year smoking history. She has never used smokeless tobacco. She reports that she does not drink alcohol or use drugs.   Family History:  The patient's family history includes Bladder Cancer in her brother; Diabetes in her father; Emphysema in her father and mother; Heart attack in her maternal grandmother; Heart disease in her father; Hypertension in her brother, brother, and sister; Kidney failure in her paternal grandmother; Stroke in her paternal grandfather.    ROS:  Please see the history of present illness.   Otherwise, review of systems is positive for SOB, chest pain.   All other systems are reviewed and negative.   PHYSICAL EXAM: VS:  There were no vitals taken for this visit. , BMI There is no height or weight on file to calculate BMI. GEN: Well nourished, well developed, in no acute distress  HEENT: normal  Neck: no JVD, carotid bruits, or masses Cardiac: RRR; no murmurs, rubs, or gallops,no edema  Respiratory:  clear to auscultation bilaterally, normal work of breathing GI: soft, nontender,  nondistended, + BS MS: no deformity or atrophy  Skin: warm and dry, device site well healed Neuro:  Strength and sensation are intact Psych: euthymic mood, full affect  EKG:  EKG is ordered today. Personal review of the ekg ordered shows A paced, rate 60anteroseptal infarct  Personal review of the device interrogation today. Results in [place]: 10/17/2016: Brain Natriuretic Peptide 238.4; TSH 2.53 04/20/2017: ALT 17 08/01/2017: BUN 13; Creatinine, Ser 0.73; Hemoglobin 13.9; Platelets 229; Potassium 4.9; Sodium 141    Lipid Panel     Component Value Date/Time   CHOL 214 (H) 01/18/2017 0833   TRIG 81 01/18/2017 0833   HDL 109 01/18/2017 0833   CHOLHDL 2.0 01/18/2017 0833   VLDL 16 01/18/2017 0833   LDLCALC 89 01/18/2017 0833     Wt Readings from Last 3 Encounters:  09/12/17 150 lb (68 kg)  08/15/17 151 lb (68.5 kg)  08/11/17 147 lb 4.8 oz (66.8 kg)  Other studies Reviewed: Additional studies/ records that were reviewed today include: TTE 10/26/16  Review of the above records today demonstrates:  - Left ventricle: The cavity size was normal. Wall thickness was   increased in a pattern of mild LVH. Systolic function was normal.   The estimated ejection fraction was in the range of 55% to 60%.   Wall motion was normal; there were no regional wall motion   abnormalities. Features are consistent with a pseudonormal left   ventricular filling pattern, with concomitant abnormal relaxation   and increased filling pressure (grade 2 diastolic dysfunction). - Aortic valve: Trileaflet; moderately calcified leaflets.   Sclerosis without stenosis. - Mitral valve: Moderately calcified annulus. Mildly calcified   leaflets . There was trivial regurgitation. - Left atrium: The atrium was moderately dilated. - Right ventricle: The cavity size was mildly dilated. Pacer wire   or catheter noted in right ventricle. Systolic function was   normal. - Right atrium: The atrium  was moderately dilated. - Tricuspid valve: Peak RV-RA gradient (S): 26 mm Hg. - Pulmonary arteries: PA peak pressure: 29 mm Hg (S). - Inferior vena cava: The vessel was normal in size. The   respirophasic diameter changes were in the normal range (>= 50%),   consistent with normal central venous pressure.   ASSESSMENT AND PLAN:  1.  Atrial fibrillation/flutter: Currently on Eliquis for anticoagulation.  Had atrial fibrillation ablation 08/10/17.***  This patients CHA2DS2-VASc Score and unadjusted Ischemic Stroke Rate (% per year) is equal to 4.8 % stroke rate/year from a score of 4  Above score calculated as 1 point each if present [CHF, HTN, DM, Vascular=MI/PAD/Aortic Plaque, Age if 65-74, or Female] Above score calculated as 2 points each if present [Age > 75, or Stroke/TIA/TE]  2. Pacemaker: Leads stable. No changes at this time.***  3. Hypertension: Blood pressure stable. No changes at this time.***  4. Diastolic heart failure: Volume status stable.***  5. Coronary artery disease: Currently feeling well without chest pain.***  Current medicines are reviewed at length with the patient today.   The patient does not have concerns regarding her medicines.  The following changes were made today:  ***  Labs/ tests ordered today include:  No orders of the defined types were placed in this encounter.    Disposition:   FU with Katalyn Matin *** months  Signed, Ansar Skoda Meredith Leeds, MD  09/18/2017 3:17 PM     [address] [place] [address] [phone] (office) [phone] (fax)

## 2017-09-19 ENCOUNTER — Other Ambulatory Visit: Payer: Self-pay | Admitting: Cardiology

## 2017-09-19 ENCOUNTER — Encounter: Payer: Self-pay | Admitting: Cardiology

## 2017-09-19 ENCOUNTER — Encounter: Payer: Medicare Other | Admitting: Cardiology

## 2017-09-20 MED ORDER — FUROSEMIDE 20 MG PO TABS: 20.0000 mg | ORAL_TABLET | ORAL | 9 refills | Status: DC

## 2017-09-20 NOTE — Telephone Encounter (Signed)
Pt's medication was sent to pt's pharmacy as requested. Confirmation received.  °

## 2017-09-21 ENCOUNTER — Inpatient Hospital Stay (HOSPITAL_COMMUNITY)
Admission: EM | Admit: 2017-09-21 | Discharge: 2017-09-25 | DRG: 308 | Disposition: A | Discharge status: Home / Self Care | Payer: Medicare Other | Attending: Cardiology | Admitting: Cardiology

## 2017-09-21 ENCOUNTER — Emergency Department (HOSPITAL_COMMUNITY): Payer: Medicare Other

## 2017-09-21 ENCOUNTER — Telehealth: Payer: Self-pay | Admitting: Cardiology

## 2017-09-21 ENCOUNTER — Encounter (HOSPITAL_COMMUNITY): Payer: Self-pay | Admitting: Emergency Medicine

## 2017-09-21 ENCOUNTER — Encounter: Payer: Medicare Other | Admitting: Osteopathic Medicine

## 2017-09-21 DIAGNOSIS — I340 Nonrheumatic mitral (valve) insufficiency: Secondary | ICD-10-CM | POA: Diagnosis not present

## 2017-09-21 DIAGNOSIS — Z833 Family history of diabetes mellitus: Secondary | ICD-10-CM

## 2017-09-21 DIAGNOSIS — I110 Hypertensive heart disease with heart failure: Secondary | ICD-10-CM | POA: Diagnosis present

## 2017-09-21 DIAGNOSIS — I480 Paroxysmal atrial fibrillation: Principal | ICD-10-CM | POA: Diagnosis present

## 2017-09-21 DIAGNOSIS — I2510 Atherosclerotic heart disease of native coronary artery without angina pectoris: Secondary | ICD-10-CM | POA: Diagnosis present

## 2017-09-21 DIAGNOSIS — I959 Hypotension, unspecified: Secondary | ICD-10-CM | POA: Diagnosis present

## 2017-09-21 DIAGNOSIS — E785 Hyperlipidemia, unspecified: Secondary | ICD-10-CM | POA: Diagnosis present

## 2017-09-21 DIAGNOSIS — Z7901 Long term (current) use of anticoagulants: Secondary | ICD-10-CM

## 2017-09-21 DIAGNOSIS — E559 Vitamin D deficiency, unspecified: Secondary | ICD-10-CM | POA: Diagnosis not present

## 2017-09-21 DIAGNOSIS — G2581 Restless legs syndrome: Secondary | ICD-10-CM | POA: Diagnosis present

## 2017-09-21 DIAGNOSIS — R0789 Other chest pain: Secondary | ICD-10-CM

## 2017-09-21 DIAGNOSIS — Z8249 Family history of ischemic heart disease and other diseases of the circulatory system: Secondary | ICD-10-CM

## 2017-09-21 DIAGNOSIS — R000 Tachycardia, unspecified: Secondary | ICD-10-CM | POA: Diagnosis not present

## 2017-09-21 DIAGNOSIS — I4892 Unspecified atrial flutter: Secondary | ICD-10-CM | POA: Diagnosis not present

## 2017-09-21 DIAGNOSIS — I481 Persistent atrial fibrillation: Secondary | ICD-10-CM | POA: Diagnosis not present

## 2017-09-21 DIAGNOSIS — I4891 Unspecified atrial fibrillation: Secondary | ICD-10-CM

## 2017-09-21 DIAGNOSIS — I5033 Acute on chronic diastolic (congestive) heart failure: Secondary | ICD-10-CM | POA: Diagnosis present

## 2017-09-21 DIAGNOSIS — R0602 Shortness of breath: Secondary | ICD-10-CM | POA: Diagnosis not present

## 2017-09-21 DIAGNOSIS — R079 Chest pain, unspecified: Secondary | ICD-10-CM | POA: Diagnosis not present

## 2017-09-21 LAB — BASIC METABOLIC PANEL
Anion gap: 6 (ref 5–15)
BUN: 9 mg/dL (ref 6–20)
CALCIUM: 8.8 mg/dL — AB (ref 8.9–10.3)
CO2: 28 mmol/L (ref 22–32)
CREATININE: 0.74 mg/dL (ref 0.44–1.00)
Chloride: 102 mmol/L (ref 101–111)
GFR calc Af Amer: >60 mL/min (ref >60)
GFR calc non Af Amer: >60 mL/min (ref >60)
GLUCOSE: 88 mg/dL (ref 65–99)
Potassium: 4.1 mmol/L (ref 3.5–5.1)
Sodium: 136 mmol/L (ref 135–145)

## 2017-09-21 LAB — I-STAT TROPONIN, ED: TROPONIN I, POC: 0.01 ng/mL (ref 0.00–0.08)

## 2017-09-21 LAB — CBC
HCT: 39.3 % (ref 36.0–46.0)
Hemoglobin: 12.6 g/dL (ref 12.0–15.0)
MCH: 29.9 pg (ref 26.0–34.0)
MCHC: 32.1 g/dL (ref 30.0–36.0)
MCV: 93.1 fL (ref 78.0–100.0)
PLATELETS: 217 10*3/uL (ref 150–400)
RBC: 4.22 MIL/uL (ref 3.87–5.11)
RDW: 15.4 % (ref 11.5–15.5)
WBC: 5.9 10*3/uL (ref 4.0–10.5)

## 2017-09-21 LAB — TROPONIN I: Troponin I: <0.03 ng/mL (ref <0.03)

## 2017-09-21 MED ORDER — ATORVASTATIN CALCIUM 10 MG PO TABS
10.0000 mg | ORAL_TABLET | Freq: Every day | ORAL | Status: DC
  Administered 2017-09-22 – 2017-09-24 (×4): 10 mg via ORAL
  Filled 2017-09-21 (×4): qty 1

## 2017-09-21 MED ORDER — METOPROLOL TARTRATE 12.5 MG HALF TABLET
12.5000 mg | ORAL_TABLET | Freq: Two times a day (BID) | ORAL | Status: DC
  Administered 2017-09-22 (×3): 12.5 mg via ORAL
  Filled 2017-09-21 (×4): qty 1

## 2017-09-21 MED ORDER — MECLIZINE HCL 25 MG PO TABS
25.0000 mg | ORAL_TABLET | Freq: Two times a day (BID) | ORAL | Status: DC | PRN
  Administered 2017-09-23 – 2017-09-25 (×3): 25 mg via ORAL
  Filled 2017-09-21 (×3): qty 1

## 2017-09-21 MED ORDER — VITAMIN D 1000 UNITS PO TABS
5000.0000 [IU] | ORAL_TABLET | Freq: Every day | ORAL | Status: DC
  Administered 2017-09-22 – 2017-09-25 (×4): 5000 [IU] via ORAL
  Filled 2017-09-21 (×4): qty 5

## 2017-09-21 MED ORDER — APIXABAN 5 MG PO TABS
5.0000 mg | ORAL_TABLET | Freq: Two times a day (BID) | ORAL | Status: DC
Start: 2017-09-21 — End: 2017-09-25
  Administered 2017-09-22 – 2017-09-25 (×8): 5 mg via ORAL
  Filled 2017-09-21 (×8): qty 1

## 2017-09-21 MED ORDER — PRAMIPEXOLE DIHYDROCHLORIDE 0.25 MG PO TABS
0.2500 mg | ORAL_TABLET | Freq: Two times a day (BID) | ORAL | Status: DC
  Administered 2017-09-22 – 2017-09-25 (×8): 0.25 mg via ORAL
  Filled 2017-09-21 (×8): qty 1

## 2017-09-21 MED ORDER — AMIODARONE HCL 200 MG PO TABS
200.0000 mg | ORAL_TABLET | Freq: Two times a day (BID) | ORAL | Status: DC
  Administered 2017-09-22 – 2017-09-25 (×8): 200 mg via ORAL
  Filled 2017-09-21 (×8): qty 1

## 2017-09-21 MED ORDER — APIXABAN 5 MG PO TABS: 5.0000 mg | ORAL_TABLET | Freq: Two times a day (BID) | ORAL | Status: DC

## 2017-09-21 NOTE — Telephone Encounter (Signed)
New message  Pt c/o Shortness Of Breath: STAT if SOB developed within the last 24 hours or pt is noticeably SOB on the phone  1. Are you currently SOB (can you hear that pt is SOB on the phone)? no  2. How long have you been experiencing SOB? 4 or 5 days  3. Are you SOB when sitting or when up moving around? No, pt only feels the chest pain and sob when she bends over   4. Are you currently experiencing any other symptoms? Chest pain

## 2017-09-21 NOTE — ED Notes (Signed)
Cardiology paged for bed request.  

## 2017-09-21 NOTE — ED Notes (Signed)
Chest pain with exertion only

## 2017-09-21 NOTE — ED Notes (Signed)
The pt is c /o chest pressure with sob  Especially with exertion for 2 days she has had an ablation in the past and her heart has been regular until today  Irregular at present

## 2017-09-21 NOTE — ED Notes (Signed)
Cardiology fellow paged for bed request orders

## 2017-09-21 NOTE — H&P (Signed)
Cardiology Admission History and Physical:   Patient ID: Donna [name]; MRN: [id number]; DOB: 23-Jan-1941   Admission date: 09/21/2017  Primary Care Provider: [name], DO Primary Cardiologist: Dr. Radford Pax  Primary Electrophysiologist:  Dr. Curt Bears  Chief Complaint:  Chest pain, SOB.   Patient Profile:   Donna [name] is a 76 y.o. female with a history of atrial fibrillation s/p ablation in 07/2017, s/p PPM insertion, HLD, minimal CAD in 2004 by cath.   History of Present Illness:   [name] presents today with a 2 week history of orthopnea, bendopnea and chest pain. Chest pain occurs mostly when she bends over, denies exertional chest pain. She notes weight gain, usually takes lasix prn, but has been taking it daily.   Upon arrival to the ED, EKG shows atrial fibrillation. Troponin negative. Last Echo was in 10/2016 EF 55-60%, moderate diastolic dysfunction.   The patient has a history of paroxysmal atrial fibrillation for a number of years.  She had a catheterization showing normal coronary arteries while in [place] in 2004.  She eventually had a pacemaker implanted several years ago which is a [place] and eventually because of recurrent paroxysmal atrial fibrillation had a catheter ablation on September 21.  She did relatively well initially and when seen a week ago in the atrial fibrillation clinic was doing fairly well.  She called the office today with bendopnea, chest discomfort and worsening shortness of breath and fatigue.  She may have taken an extra dose of Lasix because her hands were feeling swollen.  She was advised to come to the emergency room and is seen tonight.  Past Medical History:  Diagnosis Date  . Atrial fibrillation ([place]) 10/17/2016   a. On Metoprolol, Eliquis, amio;  b. 07/2017 s/p RFCA.  [name] Benign paroxysmal positional vertigo 10/18/2016  . CAD (coronary atherosclerotic disease) 10/17/2016   Hx MI  . Cystitis  10/18/2016  . Dysuria 10/18/2016  . History of anemia 10/18/2016  . History of gastric bypass 10/17/2016  . Hyperlipidemia 10/17/2016  . Lower extremity edema 10/18/2016  . Restless leg syndrome 10/17/2016  . Sleep disorder 10/17/2016   Modafinil for sleep apnea and narcolepsy  . Tobacco dependence 10/17/2016  . Urinary frequency 10/18/2016  . Vitamin D deficiency 10/18/2016   Past Surgical History:  Procedure Laterality Date  . ATRIAL FIBRILLATION ABLATION N/A 08/10/2017   Procedure: Atrial Fibrillation Ablation;  Surgeon: [name], MD;  Location: [place] [facility];  Service: Cardiovascular;  Laterality: N/A;  . CARDIAC CATHETERIZATION    . CARDIOVERSION N/A 04/27/2017   Procedure: CARDIOVERSION;  Surgeon: [name], MD;  Location[facility] ENDOSCOPY;  Service: Cardiovascular;  Laterality: N/A;  . CARDIOVERSION N/A 05/24/2017   Procedure: CARDIOVERSION;  Surgeon: [name], MD;  Location: [facility] ENDOSCOPY;  Service: Cardiovascular;  Laterality: N/A;     Medications Prior to Admission: Prior to Admission medications   Medication Sig Start Date End Date Taking? Authorizing Provider  amiodarone (PACERONE) 200 MG tablet Take 1 tablet (200 mg total) by mouth daily. 08/11/17  Yes Rogelia Mire, NP  atorvastatin (LIPITOR) 10 MG tablet Take 1 tablet (10 mg total) by mouth at bedtime. 08/15/17  Yes [name], DO  Cholecalciferol (VITAMIN D3) 5000 units CAPS Take 5,000 Units by mouth daily.    Yes [provider]  co-enzyme Q-10 30 MG capsule Take 30 mg by mouth 2 (two) times daily.    Yes [provider]  denosumab (PROLIA) 60 MG/ML SOLN  injection Inject 60 mg into the skin every 6 (six) months. Administer in upper arm, thigh, or abdomen   Yes [provider]  ELIQUIS 5 MG TABS tablet Take 1 tablet (5 mg total) by mouth 2 (two) times daily. 01/25/17  Yes [name], DO  Ferrous Sulfate (IRON) 325 (65 Fe) MG TABS Take 325 mg by mouth  daily with breakfast.    Yes [provider]  folic acid (FOLVITE) 665 MCG tablet Take 800 mcg by mouth daily.   Yes [provider]  furosemide (LASIX) 20 MG tablet Take 1 tablet (20 mg total) by mouth as directed. Take as directed Patient taking differently: Take 20 mg by mouth as needed for fluid. Take as directed 09/20/17  Yes Camnitz, Ocie Doyne, MD  loperamide (IMODIUM) 2 MG capsule Take 2 mg by mouth See admin instructions. After AM bowel movement (as needed for leakage)   Yes [provider]  meclizine (ANTIVERT) 12.5 MG tablet TAKE ONE TABLET BY MOUTH THREE TIMES DAILY AS NEEDED FOR DIZZINESS Patient taking differently: TAKE ONE TABLET BY MOUTH TWICE A DAY, every day. 08/06/17  Yes [name], DO  metoprolol tartrate (LOPRESSOR) 25 MG tablet Take 0.5 tablets (12.5 mg total) by mouth 2 (two) times daily. 09/12/17  Yes Sherran Needs, NP  modafinil (PROVIGIL) 200 MG tablet TAKE ONE TABLET BY MOUTH TWICE DAILY Patient taking differently: TAKE 200 mg TABLET BY MOUTH TWICE DAILY 06/08/17  Yes [name], DO  ondansetron (ZOFRAN-ODT) 8 MG disintegrating tablet Take 1 tablet (8 mg total) by mouth every 8 (eight) hours as needed for nausea. 11/24/16  Yes Silverio Decamp, MD  pramipexole (MIRAPEX) 0.25 MG tablet Take 1 tablet (0.25 mg total) by mouth 2 (two) times daily. 10/17/16  Yes [name], DO  potassium chloride (K-DUR) 10 MEQ tablet Take 1 tablet (10 mEq total) by mouth daily as needed. When you take Lasix 05/28/17   Camnitz, Ocie Doyne, MD     Allergies:    Allergies  Allergen Reactions  . Penicillins Hives    Has patient had a PCN reaction causing immediate rash, facial/tongue/throat swelling, SOB or lightheadedness with hypotension: Yes Has patient had a PCN reaction causing severe rash involving mucus membranes or skin necrosis: No Has patient had a PCN reaction that required hospitalization: No Has patient had a PCN reaction  occurring within the last 10 years: No If all of the above answers are "NO", then may proceed with Cephalosporin use.   . Vancomycin Anaphylaxis  . Tape Rash   Social History:   Social History   Social History  . Marital status: Widowed    Spouse name: N/A  . Number of children: N/A  . Years of education: N/A   Occupational History  . Not on file.   Social History Main Topics  . Smoking status: Former Smoker    Packs/day: 1.00    Years: 55.00  . Smokeless tobacco: Never Used  . Alcohol use No  . Drug use: No  . Sexual activity: No    Family History:   The patient's family history includes Bladder Cancer in her brother; Diabetes in her father; Emphysema in her father and mother; Heart attack in her maternal grandmother; Heart disease in her father; Hypertension in her brother, brother, and sister; Kidney failure in her paternal grandmother; Stroke in her paternal grandfather.    ROS:  Please see the history of present illness.  All other ROS reviewed and negative.  Physical Exam/Data:   Vitals:   09/21/17 1648 09/21/17 1808 09/21/17 1830 09/21/17 1842  BP: 107/70 106/79 102/82   Pulse: 96 (!) 116 (!) 125   Resp: 16 18 18    Temp:  98.5 F (36.9 C)    TempSrc:      SpO2: 100% 97% 96%   Weight:    150 lb (68 kg)  Height:    5\' 4"  (1.626 m)   No intake or output data in the 24 hours ending 09/21/17 2012 Filed Weights   09/21/17 1842  Weight: 150 lb (68 kg)   Body mass index is 25.75 kg/m.  General:  Well nourished, well developed, in no acute distress HEENT: normal Lymph: no adenopathy Neck: 7-8 cm JVD Endocrine:  No thryomegaly Vascular: No carotid bruits; FA pulses 2+ bilaterally without bruits  Cardiac:  normal S1, S2; irregularly irregular, 1/6 to 2/6 murmur.  Lungs:  clear to auscultation bilaterally, no wheezing, rhonchi or rales  Abd: soft, nontender, no hepatomegaly  Ext: trace pedal edema Musculoskeletal:  No deformities, BUE and BLE strength  normal and equal Skin: warm and dry  Neuro:  CNs 2-12 intact, no focal abnormalities noted Psych:  Normal affect    EKG:  The ECG that was done 09/21/17 was personally reviewed and demonstrates atrial fibrillation.   Relevant CV Studies: Transthoracic Echocardiography 10/2016 Study Conclusions  - Left ventricle: The cavity size was normal. Wall thickness was   increased in a pattern of mild LVH. Systolic function was normal.   The estimated ejection fraction was in the range of 55% to 60%.   Wall motion was normal; there were no regional wall motion   abnormalities. Features are consistent with a pseudonormal left   ventricular filling pattern, with concomitant abnormal relaxation   and increased filling pressure (grade 2 diastolic dysfunction). - Aortic valve: Trileaflet; moderately calcified leaflets.   Sclerosis without stenosis. - Mitral valve: Moderately calcified annulus. Mildly calcified   leaflets . There was trivial regurgitation. - Left atrium: The atrium was moderately dilated. - Right ventricle: The cavity size was mildly dilated. Pacer wire   or catheter noted in right ventricle. Systolic function was   normal. - Right atrium: The atrium was moderately dilated. - Tricuspid valve: Peak RV-RA gradient (S): 26 mm Hg. - Pulmonary arteries: PA peak pressure: 29 mm Hg (S). - Inferior vena cava: The vessel was normal in size. The   respirophasic diameter changes were in the normal range (>= 50%),   consistent with normal central venous pressure.  Impressions:  - Normal LV size with mild LV hypertrophy. Moderate diastolic   dysfunction. EF 55-60%. Mildly dilated RV with normal systolic   function. Biatrial enlargement.  Laboratory Data:  Chemistry Recent Labs Lab 09/21/17 1143  NA 136  K 4.1  CL 102  CO2 28  GLUCOSE 88  BUN 9  CREATININE 0.74  CALCIUM 8.8*  GFRNONAA >60  GFRAA >60  ANIONGAP 6   Hematology Recent Labs Lab 09/21/17 1143  WBC 5.9    RBC 4.22  HGB 12.6  HCT 39.3  MCV 93.1  MCH 29.9  MCHC 32.1  RDW 15.4  PLT 217   Recent Labs Lab 09/21/17 1215  TROPIPOC 0.01   Radiology/Studies:  Dg Chest 2 View  Result Date: 09/21/2017 CLINICAL DATA:  Chest pain, shortness of breath and dizziness for 2 days. EXAM: CHEST  2 VIEW COMPARISON:  CT chest 08/03/2017.  PA and lateral chest 04/17/2017 FINDINGS: Lungs are clear. Heart  size is normal. No pneumothorax or pleural effusion. Aortic atherosclerosis is seen. Pacing device is in place. No acute bony abnormality. IMPRESSION: No acute disease. Atherosclerosis. Electronically Signed   By: Inge Rise M.D.   On: 09/21/2017 12:22    Assessment and Plan:   1. Chest pain: Likely in the setting of acute on chronic diastolic CHF brought on by recurrent atrial fibrillation.  - Cycle troponin - Repeat Echo - No ASA with Eliquis - Continue Metoprolol.   2.  Recurrent persistent atrial fibrillation: S/p ablation in 07/2017. Now with recurrent atrial fib.  - Increase Amio to 200 mg BID.  - Will have pacer interrogated tomorrow.  - Continue Eliquis - Will ask EP to see in the am.   3. Acute on chronic diastolic CHF: In the setting of recurrent afib.  - Will repeat Echo to ensure stable EF.  - Give 40 mg IV Lasix now.  - Continue metoprolol.  - Daily weights.     Severity of Illness: The appropriate patient status for this patient is INPATIENT. Inpatient status is judged to be reasonable and necessary in order to provide the required intensity of service to ensure the patient's safety. The patient's presenting symptoms, physical exam findings, and initial radiographic and laboratory data in the context of their chronic comorbidities is felt to place them at high risk for further clinical deterioration. Furthermore, it is not anticipated that the patient will be medically stable for discharge from the hospital within 2 midnights of admission. The following factors support the  patient status of inpatient.   " The patient's presenting symptoms include chest pain . " The worrisome physical exam findings include SOB " The initial radiographic and laboratory data are worrisome because of abnormal EKG " The chronic co-morbidities include HTN.    * I certify that at the point of admission it is my clinical judgment that the patient will require inpatient hospital care spanning beyond 2 midnights from the point of admission due to high intensity of service, high risk for further deterioration and high frequency of surveillance required.*    For questions or updates, please contact [place] Please consult [email or web address] for contact info under Cardiology/STEMI.    Signed, Arbutus Leas, NP  09/21/2017 8:12 PM   Patient seen and examined, complete history reviewed.  She now has recurrent persistent atrial fibrillation that is symptomatic following ablation about 6 weeks ago.  She might need to have a repeat cardioversion as it is still early and this may not be considered a failure since she is in the healing phase.  We will increase amiodarone and obtain a repeat echo since she does have a known left bundle branch block.  Check BNP level intravenous Lasix if BNP level elevated.  Kerry Hough M.D. [facility] 09/21/2017 8:50 PM

## 2017-09-21 NOTE — ED Triage Notes (Signed)
Pt to ER for evaluation of 3-4 days of central chest pressure with shortness of breath, states is worse when bending over. VSS. Reports ablation completed in September for atrial fibrillation.

## 2017-09-21 NOTE — Telephone Encounter (Signed)
Returned call to patient.She stated she has been having chest pressure and sob off and on since this past Mon 10/29.Stated pressure much worse this morning.Rates pressure # 8.Advised to go to ED.Trish notified.

## 2017-09-21 NOTE — ED Notes (Signed)
Spoke to Dr.Verde who will place bed order

## 2017-09-21 NOTE — ED Notes (Signed)
Pt assisted to bedside commode. Meal tray ordered per Dr.Steinl

## 2017-09-21 NOTE — ED Provider Notes (Signed)
[facility] EMERGENCY DEPARTMENT Provider Note   CSN: [id number] Arrival date & time: 09/21/17  1122     History   Chief Complaint Chief Complaint  Patient presents with  . Chest Pain  . Shortness of Breath    HPI Donna [name] is a 76 y.o. female.  Patient c/o palpitations, chest pressure, sob for the past 3-4 days intermittently. States at times seems worse when bends over.  States hx afib, on eliquis - states had ablation 1 month ago, and has been in regular rhythm with hr in 70s since then, until last few days. States compliant w normal meds. No cough or uri c/o. No fever or chills. Denies any constant, and or pleuritic cp. No leg swelling or pain. Symptoms last several minutes.  Called her cardiologist and was told to come into the ER.    The history is provided by the patient.  Chest Pain   Associated symptoms include palpitations and shortness of breath. Pertinent negatives include no abdominal pain, no cough, no fever, no headaches and no vomiting.  Shortness of Breath  Associated symptoms include chest pain. Pertinent negatives include no fever, no headaches, no sore throat, no neck pain, no cough, no vomiting, no abdominal pain, no rash and no leg swelling.    Past Medical History:  Diagnosis Date  . Atrial fibrillation ([place]) 10/17/2016   a. On Metoprolol, Eliquis, amio;  b. 07/2017 s/p RFCA.  [name] Benign paroxysmal positional vertigo 10/18/2016  . CAD (coronary atherosclerotic disease) 10/17/2016   Hx MI  . Cystitis 10/18/2016  . Dysuria 10/18/2016  . History of anemia 10/18/2016  . History of gastric bypass 10/17/2016  . Hyperlipidemia 10/17/2016  . Lower extremity edema 10/18/2016  . Restless leg syndrome 10/17/2016  . Sleep disorder 10/17/2016   Modafinil for sleep apnea and narcolepsy  . Tobacco dependence 10/17/2016  . Urinary frequency 10/18/2016  . Vitamin D deficiency 10/18/2016    Patient Active Problem List   Diagnosis Date  Noted  . Status post ablation of atrial fibrillation 08/15/2017  . AF (atrial fibrillation) ([place]) 08/10/2017  . Atrial flutter ([place])   . Osteoporosis 03/20/2017  . Ischemic changes on head CT 03/01/2017  . Pacemaker 03/01/2017  . Prediabetes 03/01/2017  . Recurrent UTI 01/29/2017  . Mucinous cyst of left thumb 12/18/2016  . Chronic idiopathic constipation 12/08/2016  . Chronic diastolic congestive heart failure ([place]) 11/08/2016  . Benign paroxysmal positional vertigo 10/18/2016  . Lower extremity edema 10/18/2016  . Acute pyelonephritis, right 10/18/2016  . Vitamin D deficiency 10/18/2016  . History of anemia 10/18/2016  . Restless leg syndrome 10/17/2016  . CAD (coronary atherosclerotic disease) 10/17/2016  . Paroxysmal atrial fibrillation ([place]) 10/17/2016  . History of gastric bypass 10/17/2016  . Hyperlipidemia 10/17/2016  . Sleep disorder 10/17/2016  . Tobacco dependence 10/17/2016    Past Surgical History:  Procedure Laterality Date  . ATRIAL FIBRILLATION ABLATION N/A 08/10/2017   Procedure: Atrial Fibrillation Ablation;  Surgeon: [name], MD;  Location: [facility];  Service: Cardiovascular;  Laterality: N/A;  . CARDIAC CATHETERIZATION    . CARDIOVERSION N/A 04/27/2017   Procedure: CARDIOVERSION;  Surgeon: [name], MD;  Location: [facility] ENDOSCOPY;  Service: Cardiovascular;  Laterality: N/A;  . CARDIOVERSION N/A 05/24/2017   Procedure: CARDIOVERSION;  Surgeon: [name], MD;  Location: [facility] ENDOSCOPY;  Service: Cardiovascular;  Laterality: N/A;    OB History    No data available  Home Medications    Prior to Admission medications   Medication Sig Start Date End Date Taking? Authorizing Provider  amiodarone (PACERONE) 200 MG tablet Take 1 tablet (200 mg total) by mouth daily. 08/11/17   Rogelia Mire, NP  atorvastatin (LIPITOR) 10 MG tablet Take 1 tablet (10 mg total) by mouth at bedtime. 08/15/17   [name], DO    Cholecalciferol (VITAMIN D3) 5000 units CAPS Take 5,000 Units by mouth daily.     [provider]  co-enzyme Q-10 30 MG capsule Take 30 mg by mouth 2 (two) times daily.     [provider]  denosumab (PROLIA) 60 MG/ML SOLN injection Inject 60 mg into the skin every 6 (six) months. Administer in upper arm, thigh, or abdomen    [provider]  ELIQUIS 5 MG TABS tablet Take 1 tablet (5 mg total) by mouth 2 (two) times daily. 01/25/17   [name], DO  Ferrous Sulfate (IRON) 325 (65 Fe) MG TABS Take 325 mg by mouth daily with breakfast.     [provider]  folic acid (FOLVITE) 409 MCG tablet Take 800 mcg by mouth daily.    [provider]  furosemide (LASIX) 20 MG tablet Take 1 tablet (20 mg total) by mouth as directed. Take as directed 09/20/17   Camnitz, Ocie Doyne, MD  loperamide (IMODIUM) 2 MG capsule Take 2 mg by mouth See admin instructions. After AM bowel movement (as needed for leakage)    [provider]  meclizine (ANTIVERT) 12.5 MG tablet TAKE ONE TABLET BY MOUTH THREE TIMES DAILY AS NEEDED FOR DIZZINESS Patient taking differently: TAKE ONE TABLET BY MOUTH TWICE A DAY, every day. 08/06/17   [name], DO  metoprolol tartrate (LOPRESSOR) 25 MG tablet Take 0.5 tablets (12.5 mg total) by mouth 2 (two) times daily. 09/12/17   Sherran Needs, NP  modafinil (PROVIGIL) 200 MG tablet TAKE ONE TABLET BY MOUTH TWICE DAILY 06/08/17   [name], DO  ondansetron (ZOFRAN-ODT) 8 MG disintegrating tablet Take 1 tablet (8 mg total) by mouth every 8 (eight) hours as needed for nausea. 11/24/16   Silverio Decamp, MD  potassium chloride (K-DUR) 10 MEQ tablet Take 1 tablet (10 mEq total) by mouth daily as needed. When you take Lasix 05/28/17   Camnitz, Ocie Doyne, MD  pramipexole (MIRAPEX) 0.25 MG tablet Take 1 tablet (0.25 mg total) by mouth 2 (two) times daily. 10/17/16   [name], DO    Family History Family  History  Problem Relation Age of Onset  . Emphysema Mother   . Heart disease Father   . Diabetes Father   . Emphysema Father   . Hypertension Sister   . Bladder Cancer Brother   . Hypertension Brother   . Heart attack Maternal Grandmother   . Kidney failure Paternal Grandmother   . Stroke Paternal Grandfather   . Hypertension Brother     Social History Social History  Substance Use Topics  . Smoking status: Former Smoker    Packs/day: 1.00    Years: 55.00  . Smokeless tobacco: Never Used  . Alcohol use No     Allergies   Penicillins; Vancomycin; and Tape   Review of Systems Review of Systems  Constitutional: Negative for fever.  HENT: Negative for sore throat.   Eyes: Negative for redness.  Respiratory: Positive for shortness of breath. Negative for cough.   Cardiovascular: Positive for chest pain and palpitations. Negative for leg swelling.  Gastrointestinal: Negative  for abdominal pain and vomiting.  Genitourinary: Negative for flank pain.  Musculoskeletal: Negative for neck pain.  Skin: Negative for rash.  Neurological: Negative for headaches.  Hematological: Does not bruise/bleed easily.  Psychiatric/Behavioral: Negative for confusion.     Physical Exam Updated Vital Signs BP 106/79   Pulse (!) 116   Temp 98.5 F (36.9 C)   Resp 18   SpO2 97%   Physical Exam  Constitutional: She appears well-developed and well-nourished. No distress.  HENT:  Mouth/Throat: Oropharynx is clear and moist.  Eyes: Conjunctivae are normal. No scleral icterus.  Neck: Neck supple. No tracheal deviation present.  Cardiovascular: Normal heart sounds and intact distal pulses.  Exam reveals no gallop and no friction rub.   No murmur heard. Mildly tachycardic, hr 110-120, irregular  Pulmonary/Chest: Effort normal and breath sounds normal. No respiratory distress.  Abdominal: Soft. Normal appearance. She exhibits no distension. There is no tenderness.  Musculoskeletal: She  exhibits no edema or tenderness.  Neurological: She is alert.  Skin: Skin is warm and dry. No rash noted. She is not diaphoretic.  Psychiatric: She has a normal mood and affect.  Nursing note and vitals reviewed.    ED Treatments / Results  Labs (all labs ordered are listed, but only abnormal results are displayed) Results for orders placed or performed during the hospital encounter of 48/18/56  Basic metabolic panel  Result Value Ref Range   Sodium 136 135 - 145 mmol/L   Potassium 4.1 3.5 - 5.1 mmol/L   Chloride 102 101 - 111 mmol/L   CO2 28 22 - 32 mmol/L   Glucose, Bld 88 65 - 99 mg/dL   BUN 9 6 - 20 mg/dL   Creatinine, Ser 0.74 0.44 - 1.00 mg/dL   Calcium 8.8 (L) 8.9 - 10.3 mg/dL   GFR calc non Af Amer >60 >60 mL/min   GFR calc Af Amer >60 >60 mL/min   Anion gap 6 5 - 15  CBC  Result Value Ref Range   WBC 5.9 4.0 - 10.5 K/uL   RBC 4.22 3.87 - 5.11 MIL/uL   Hemoglobin 12.6 12.0 - 15.0 g/dL   HCT 39.3 36.0 - 46.0 %   MCV 93.1 78.0 - 100.0 fL   MCH 29.9 26.0 - 34.0 pg   MCHC 32.1 30.0 - 36.0 g/dL   RDW 15.4 11.5 - 15.5 %   Platelets 217 150 - 400 K/uL  I-stat troponin, ED  Result Value Ref Range   Troponin i, poc 0.01 0.00 - 0.08 ng/mL   Comment 3           Dg Chest 2 View  Result Date: 09/21/2017 CLINICAL DATA:  Chest pain, shortness of breath and dizziness for 2 days. EXAM: CHEST  2 VIEW COMPARISON:  CT chest 08/03/2017.  PA and lateral chest 04/17/2017 FINDINGS: Lungs are clear. Heart size is normal. No pneumothorax or pleural effusion. Aortic atherosclerosis is seen. Pacing device is in place. No acute bony abnormality. IMPRESSION: No acute disease. Atherosclerosis. Electronically Signed   By: Inge Rise M.D.   On: 09/21/2017 12:22    EKG  EKG Interpretation  Date/Time:  Friday September 21 2017 11:45:14 EDT Ventricular Rate:  87 PR Interval:    QRS Duration: 126 QT Interval:  410 QTC Calculation: 493 R Axis:   -20 Text Interpretation:  Atrial  fibrillation Electronic demand pacing Left bundle branch block Abnormal ECG Confirmed by Lajean Saver [phone]) on 09/21/2017 6:01:33 PM  Radiology Dg Chest 2 View  Result Date: 09/21/2017 CLINICAL DATA:  Chest pain, shortness of breath and dizziness for 2 days. EXAM: CHEST  2 VIEW COMPARISON:  CT chest 08/03/2017.  PA and lateral chest 04/17/2017 FINDINGS: Lungs are clear. Heart size is normal. No pneumothorax or pleural effusion. Aortic atherosclerosis is seen. Pacing device is in place. No acute bony abnormality. IMPRESSION: No acute disease. Atherosclerosis. Electronically Signed   By: Inge Rise M.D.   On: 09/21/2017 12:22    Procedures Procedures (including critical care time)  Medications Ordered in ED Medications - No data to display   Initial Impression / Assessment and Plan / ED Course  I have reviewed the triage vital signs and the nursing notes.  Pertinent labs & imaging results that were available during my care of the patient were reviewed by me and considered in my medical decision making (see chart for details).  Iv ns. Monitor. Ecg. Labs. Cxr.  Reviewed nursing notes and prior charts for additional history.   Patient in afib, hr 120, pts cardiologist contact.   Initial trop normal.  Cardiology consulted - they will admit.   Final Clinical Impressions(s) / ED Diagnoses   Final diagnoses:  None    New Prescriptions New Prescriptions   No medications on file     Lajean Saver, MD 09/22/17 1556

## 2017-09-22 ENCOUNTER — Inpatient Hospital Stay (HOSPITAL_COMMUNITY): Payer: Medicare Other

## 2017-09-22 DIAGNOSIS — I480 Paroxysmal atrial fibrillation: Principal | ICD-10-CM

## 2017-09-22 DIAGNOSIS — I340 Nonrheumatic mitral (valve) insufficiency: Secondary | ICD-10-CM

## 2017-09-22 LAB — BRAIN NATRIURETIC PEPTIDE: B Natriuretic Peptide: 293.9 pg/mL — ABNORMAL HIGH (ref 0.0–100.0)

## 2017-09-22 LAB — CBC
HCT: 41 % (ref 36.0–46.0)
HEMOGLOBIN: 13.1 g/dL (ref 12.0–15.0)
MCH: 29.8 pg (ref 26.0–34.0)
MCHC: 32 g/dL (ref 30.0–36.0)
MCV: 93.4 fL (ref 78.0–100.0)
Platelets: 273 10*3/uL (ref 150–400)
RBC: 4.39 MIL/uL (ref 3.87–5.11)
RDW: 15.2 % (ref 11.5–15.5)
WBC: 7.4 10*3/uL (ref 4.0–10.5)

## 2017-09-22 LAB — ECHOCARDIOGRAM COMPLETE
Height: 64 in
WEIGHTICAEL: 2376 [oz_av]

## 2017-09-22 LAB — BASIC METABOLIC PANEL
ANION GAP: 8 (ref 5–15)
BUN: 9 mg/dL (ref 6–20)
CALCIUM: 8.6 mg/dL — AB (ref 8.9–10.3)
CHLORIDE: 101 mmol/L (ref 101–111)
CO2: 27 mmol/L (ref 22–32)
CREATININE: 0.71 mg/dL (ref 0.44–1.00)
GFR calc non Af Amer: >60 mL/min (ref >60)
Glucose, Bld: 88 mg/dL (ref 65–99)
Potassium: 4 mmol/L (ref 3.5–5.1)
SODIUM: 136 mmol/L (ref 135–145)

## 2017-09-22 LAB — TSH: TSH: 9.743 u[IU]/mL — ABNORMAL HIGH (ref 0.350–4.500)

## 2017-09-22 LAB — TROPONIN I
Troponin I: <0.03 ng/mL (ref <0.03)
Troponin I: <0.03 ng/mL (ref <0.03)

## 2017-09-22 LAB — PROTIME-INR
INR: 1.1
PROTHROMBIN TIME: 14.1 s (ref 11.4–15.2)

## 2017-09-22 MED ORDER — FUROSEMIDE 10 MG/ML IJ SOLN
20.0000 mg | Freq: Once | INTRAMUSCULAR | Status: AC
  Administered 2017-09-22: 20 mg via INTRAVENOUS
  Filled 2017-09-22: qty 2

## 2017-09-22 MED ORDER — ALUM & MAG HYDROXIDE-SIMETH 200-200-20 MG/5ML PO SUSP
30.0000 mL | Freq: Four times a day (QID) | ORAL | Status: DC | PRN
  Administered 2017-09-22: 30 mL via ORAL
  Filled 2017-09-22: qty 30

## 2017-09-22 NOTE — Plan of Care (Signed)
Problem: Safety: Goal: Ability to remain free from injury will improve Outcome: Progressing Call bell in reach. Instructed to call for needs.

## 2017-09-22 NOTE — Plan of Care (Signed)
Problem: Pain Managment: Goal: General experience of comfort will improve Outcome: Progressing Pt denies pain

## 2017-09-22 NOTE — Progress Notes (Addendum)
DAILY PROGRESS NOTE   Patient Name: Donna [name] Date of Encounter: 09/22/2017  Chief Complaint   Tired  Patient Profile   Donna [name] is a 76 y.o. female with a history of atrial fibrillation s/p ablation in 07/2017, s/p PPM insertion, HLD, minimal CAD in 2004 by cath.   Subjective   No further chest pain. Troponins negative overnight. BNP 293. Echo today shows LVEF 60-65%, normal wall motion, mild MR, trivial pericardial effusion. I's/O's not measured overnight - weight is 148 lbs today. Discussion about giving lasix, but she does not recall it - no evidence of lasix on med list. Device interrogation pending.  Objective   Vitals:   09/22/17 0034 09/22/17 0547 09/22/17 0925 09/22/17 1424  BP: 113/70 111/78 115/76 106/79  Pulse: 78 81 87 98  Resp: 16 11  19   Temp: 98 F (36.7 C) 98.5 F (36.9 C)  98.7 F (37.1 C)  TempSrc: Oral Oral  Oral  SpO2: 97% 97%  98%  Weight: 148 lb 8 oz (67.4 kg)     Height: 5' 4"  (1.626 m)       Intake/Output Summary (Last 24 hours) at 09/22/17 1435 Last data filed at 09/22/17 0000  Gross per 24 hour  Intake              180 ml  Output                0 ml  Net              180 ml   Filed Weights   09/21/17 1842 09/22/17 0034  Weight: 150 lb (68 kg) 148 lb 8 oz (67.4 kg)    Physical Exam   General appearance: alert and no distress Lungs: clear to auscultation bilaterally Heart: irregularly irregular rhythm Extremities: extremities normal, atraumatic, no cyanosis or edema Neurologic: Grossly normal  Inpatient Medications    Scheduled Meds: . amiodarone  200 mg Oral BID  . apixaban  5 mg Oral BID  . atorvastatin  10 mg Oral QHS  . cholecalciferol  5,000 Units Oral Daily  . metoprolol tartrate  12.5 mg Oral BID  . pramipexole  0.25 mg Oral BID    Continuous Infusions:   PRN Meds: meclizine   Labs   Results for orders placed or performed during the hospital encounter of 09/21/17 (from the past 48 hour(s))    Basic metabolic panel     Status: Abnormal   Collection Time: 09/21/17 11:43 AM  Result Value Ref Range   Sodium 136 135 - 145 mmol/L   Potassium 4.1 3.5 - 5.1 mmol/L   Chloride 102 101 - 111 mmol/L   CO2 28 22 - 32 mmol/L   Glucose, Bld 88 65 - 99 mg/dL   BUN 9 6 - 20 mg/dL   Creatinine, Ser 0.74 0.44 - 1.00 mg/dL   Calcium 8.8 (L) 8.9 - 10.3 mg/dL   GFR calc non Af Amer >60 >60 mL/min   GFR calc Af Amer >60 >60 mL/min    Comment: (NOTE) The eGFR has been calculated using the CKD EPI equation. This calculation has not been validated in all clinical situations. eGFR's persistently <60 mL/min signify possible Chronic Kidney Disease.    Anion gap 6 5 - 15  CBC     Status: None   Collection Time: 09/21/17 11:43 AM  Result Value Ref Range   WBC 5.9 4.0 - 10.5 K/uL   RBC 4.22 3.87 - 5.11 MIL/uL   Hemoglobin  12.6 12.0 - 15.0 g/dL   HCT 39.3 36.0 - 46.0 %   MCV 93.1 78.0 - 100.0 fL   MCH 29.9 26.0 - 34.0 pg   MCHC 32.1 30.0 - 36.0 g/dL   RDW 15.4 11.5 - 15.5 %   Platelets 217 150 - 400 K/uL  I-stat troponin, ED     Status: None   Collection Time: 09/21/17 12:15 PM  Result Value Ref Range   Troponin i, poc 0.01 0.00 - 0.08 ng/mL   Comment 3            Comment: Due to the release kinetics of cTnI, a negative result within the first hours of the onset of symptoms does not rule out myocardial infarction with certainty. If myocardial infarction is still suspected, repeat the test at appropriate intervals.   Troponin I (q 6hr x 3)     Status: None   Collection Time: 09/21/17  9:09 PM  Result Value Ref Range   Troponin I <0.03 <0.03 ng/mL  Troponin I (q 6hr x 3)     Status: None   Collection Time: 09/22/17  2:30 AM  Result Value Ref Range   Troponin I <0.03 <0.03 ng/mL  TSH     Status: Abnormal   Collection Time: 09/22/17  2:30 AM  Result Value Ref Range   TSH 9.743 (H) 0.350 - 4.500 uIU/mL    Comment: Performed by a 3rd Generation assay with a functional sensitivity of  <=0.01 uIU/mL.  Brain natriuretic peptide     Status: Abnormal   Collection Time: 09/22/17  2:30 AM  Result Value Ref Range   B Natriuretic Peptide 293.9 (H) 0.0 - 100.0 pg/mL  Troponin I (q 6hr x 3)     Status: None   Collection Time: 09/22/17 11:07 AM  Result Value Ref Range   Troponin I <0.03 <0.03 ng/mL  CBC     Status: None   Collection Time: 09/22/17 11:07 AM  Result Value Ref Range   WBC 7.4 4.0 - 10.5 K/uL   RBC 4.39 3.87 - 5.11 MIL/uL   Hemoglobin 13.1 12.0 - 15.0 g/dL   HCT 41.0 36.0 - 46.0 %   MCV 93.4 78.0 - 100.0 fL   MCH 29.8 26.0 - 34.0 pg   MCHC 32.0 30.0 - 36.0 g/dL   RDW 15.2 11.5 - 15.5 %   Platelets 273 150 - 400 K/uL  Basic metabolic panel     Status: Abnormal   Collection Time: 09/22/17 11:07 AM  Result Value Ref Range   Sodium 136 135 - 145 mmol/L   Potassium 4.0 3.5 - 5.1 mmol/L   Chloride 101 101 - 111 mmol/L   CO2 27 22 - 32 mmol/L   Glucose, Bld 88 65 - 99 mg/dL   BUN 9 6 - 20 mg/dL   Creatinine, Ser 0.71 0.44 - 1.00 mg/dL   Calcium 8.6 (L) 8.9 - 10.3 mg/dL   GFR calc non Af Amer >60 >60 mL/min   GFR calc Af Amer >60 >60 mL/min    Comment: (NOTE) The eGFR has been calculated using the CKD EPI equation. This calculation has not been validated in all clinical situations. eGFR's persistently <60 mL/min signify possible Chronic Kidney Disease.    Anion gap 8 5 - 15  Protime-INR     Status: None   Collection Time: 09/22/17 11:07 AM  Result Value Ref Range   Prothrombin Time 14.1 11.4 - 15.2 seconds   INR 1.10  ECG   N/A  Telemetry   Atrial pacing - Personally Reviewed  Radiology    Dg Chest 2 View  Result Date: 09/21/2017 CLINICAL DATA:  Chest pain, shortness of breath and dizziness for 2 days. EXAM: CHEST  2 VIEW COMPARISON:  CT chest 08/03/2017.  PA and lateral chest 04/17/2017 FINDINGS: Lungs are clear. Heart size is normal. No pneumothorax or pleural effusion. Aortic atherosclerosis is seen. Pacing device is in place. No acute  bony abnormality. IMPRESSION: No acute disease. Atherosclerosis. Electronically Signed   By: Inge Rise M.D.   On: 09/21/2017 12:22    Cardiac Studies   LV EF: 60% -   65%  ------------------------------------------------------------------- Indications:      CHF - 428.0.  ------------------------------------------------------------------- History:   PMH:   Atrial fibrillation.  Atrial flutter.  Risk factors:  Current tobacco use. Dyslipidemia.  ------------------------------------------------------------------- Study Conclusions  - Left ventricle: The cavity size was normal. Wall thickness was   increased in a pattern of mild LVH. Systolic function was normal.   The estimated ejection fraction was in the range of 60% to 65%.   Wall motion was normal; there were no regional wall motion   abnormalities. The study is not technically sufficient to allow   evaluation of LV diastolic function. - Aortic valve: There was trivial regurgitation. - Mitral valve: Calcified annulus. There was mild regurgitation. - Left atrium: The atrium was severely dilated. - Right atrium: The atrium was mildly dilated. - Pericardium, extracardiac: A trivial pericardial effusion was   identified.  Impressions:  - Normal LV systolic function; mild LVH; sclerotic aortic valve   with trace AI; mild MR; biatrial enlargement; mild TR.  Assessment   1. Active Problems: 2.   Atrial fibrillation ([place]) 3. Acute diastolic congestive heart failure. 4. Chest pain  Plan   1. Appears to have had recurrent atrial fibrillation despite prior ablation. Amiodarone increased. Awaiting device interrogation - may need to pursue cardioversion early next week. She is anticoagulated on Eliquis. Doubt cardiac cause of chest pain, very positional. Troponin negative. Perhaps some mild diastolic CHF - may be related to afib. Will give low dose IV lasix today.   Time Spent Directly with Patient:  I have spent a  total of 25 minutes with the patient reviewing hospital notes, telemetry, EKGs, labs and examining the patient as well as establishing an assessment and plan that was discussed personally with the patient. > 50% of time was spent in direct patient care.  Length of Stay:  LOS: 1 day   Pixie Casino, MD, [place]  Attending Cardiologist  Direct Dial: [phone]  Fax: [phone]  Website:  [email or web address] 09/22/2017, 2:35 PM

## 2017-09-22 NOTE — Progress Notes (Signed)
  Echocardiogram 2D Echocardiogram has been performed.  Donna [name] L Androw 09/22/2017, 10:31 AM

## 2017-09-23 ENCOUNTER — Other Ambulatory Visit: Payer: Self-pay

## 2017-09-23 MED ORDER — SODIUM CHLORIDE 0.9 % IV BOLUS (SEPSIS)
500.0000 mL | Freq: Once | INTRAVENOUS | Status: AC
  Administered 2017-09-23: 500 mL via INTRAVENOUS

## 2017-09-23 MED ORDER — SODIUM CHLORIDE 0.9% FLUSH
3.0000 mL | Freq: Two times a day (BID) | INTRAVENOUS | Status: DC
  Administered 2017-09-23 – 2017-09-25 (×3): 3 mL via INTRAVENOUS

## 2017-09-23 MED ORDER — SODIUM CHLORIDE 0.9 % IV SOLN: 250.0000 mL | INTRAVENOUS | Status: DC

## 2017-09-23 MED ORDER — SODIUM CHLORIDE 0.9% FLUSH: 3.0000 mL | INTRAVENOUS | Status: DC | PRN

## 2017-09-23 MED ORDER — LORAZEPAM 0.5 MG PO TABS
0.5000 mg | ORAL_TABLET | Freq: Once | ORAL | Status: AC
  Administered 2017-09-23: 0.5 mg via ORAL
  Filled 2017-09-23: qty 1

## 2017-09-23 NOTE — Plan of Care (Signed)
SBP = 80's this am. Held BB. IVF bolus 500 ml given. C/O antivert given as well per pt request. SBP improved to 90's. Asymptomatic after fluid bolus.

## 2017-09-23 NOTE — Progress Notes (Signed)
DAILY PROGRESS NOTE   Patient Name: Donna [name] Date of Encounter: 09/23/2017  Chief Complaint   Fatigue, lightheaded  Patient Profile   Linette Gunderson is a 76 y.o. female with a history of atrial fibrillation s/p ablation in 07/2017, s/p PPM insertion, HLD, minimal CAD in 2004 by cath.   Subjective   Diuresed about 1L negative overnight. BP has been low today. Weight down from 148 to 137? Suspect one of those is inaccurate. Creatinine is stable. Interrogation yesterday shows a-fib. Now appears somewhat dry.  Objective   Vitals:   09/23/17 0539 09/23/17 0919 09/23/17 0928 09/23/17 1033  BP: (!) 85/61 (!) 75/65 (!) 86/67 (!) 89/53  Pulse: 99 70 93 (!) 107  Resp: _0 (!) 24  Temp: 98.4 F (36.9 C)     TempSrc: Oral     SpO2: 97% 97% 98% 96%  Weight: 137 lb 1.6 oz (62.2 kg)     Height:        Intake/Output Summary (Last 24 hours) at 09/23/2017 1054 Last data filed at 09/22/2017 2326 Gross per 24 hour  Intake -  Output 1250 ml  Net -1250 ml   Filed Weights   09/21/17 1842 09/22/17 0034 09/23/17 0539  Weight: 150 lb (68 kg) 148 lb 8 oz (67.4 kg) 137 lb 1.6 oz (62.2 kg)    Physical Exam   General appearance: alert and no distress Lungs: clear to auscultation bilaterally Heart: irregularly irregular rhythm Extremities: extremities normal, atraumatic, no cyanosis or edema Neurologic: Grossly normal  Inpatient Medications    Scheduled Meds: . amiodarone  200 mg Oral BID  . apixaban  5 mg Oral BID  . atorvastatin  10 mg Oral QHS  . cholecalciferol  5,000 Units Oral Daily  . metoprolol tartrate  12.5 mg Oral BID  . pramipexole  0.25 mg Oral BID    Continuous Infusions:   PRN Meds: alum & mag hydroxide-simeth, meclizine   Labs   Results for orders placed or performed during the hospital encounter of 09/21/17 (from the past 48 hour(s))  Basic metabolic panel     Status: Abnormal   Collection Time: 09/21/17 11:43 AM  Result Value Ref Range    Sodium 136 135 - 145 mmol/L   Potassium 4.1 3.5 - 5.1 mmol/L   Chloride 102 101 - 111 mmol/L   CO2 28 22 - 32 mmol/L   Glucose, Bld 88 65 - 99 mg/dL   BUN 9 6 - 20 mg/dL   Creatinine, Ser 0.74 0.44 - 1.00 mg/dL   Calcium 8.8 (L) 8.9 - 10.3 mg/dL   GFR calc non Af Amer >60 >60 mL/min   GFR calc Af Amer >60 >60 mL/min    Comment: (NOTE) The eGFR has been calculated using the CKD EPI equation. This calculation has not been validated in all clinical situations. eGFR's persistently <60 mL/min signify possible Chronic Kidney Disease.    Anion gap 6 5 - 15  CBC     Status: None   Collection Time: 09/21/17 11:43 AM  Result Value Ref Range   WBC 5.9 4.0 - 10.5 K/uL   RBC 4.22 3.87 - 5.11 MIL/uL   Hemoglobin 12.6 12.0 - 15.0 g/dL   HCT 39.3 36.0 - 46.0 %   MCV 93.1 78.0 - 100.0 fL   MCH 29.9 26.0 - 34.0 pg   MCHC 32.1 30.0 - 36.0 g/dL   RDW 15.4 11.5 - 15.5 %   Platelets 217 150 - 400 K/uL  I-stat troponin, ED     Status: None   Collection Time: 09/21/17 12:15 PM  Result Value Ref Range   Troponin i, poc 0.01 0.00 - 0.08 ng/mL   Comment 3            Comment: Due to the release kinetics of cTnI, a negative result within the first hours of the onset of symptoms does not rule out myocardial infarction with certainty. If myocardial infarction is still suspected, repeat the test at appropriate intervals.   Troponin I (q 6hr x 3)     Status: None   Collection Time: 09/21/17  9:09 PM  Result Value Ref Range   Troponin I <0.03 <0.03 ng/mL  Troponin I (q 6hr x 3)     Status: None   Collection Time: 09/22/17  2:30 AM  Result Value Ref Range   Troponin I <0.03 <0.03 ng/mL  TSH     Status: Abnormal   Collection Time: 09/22/17  2:30 AM  Result Value Ref Range   TSH 9.743 (H) 0.350 - 4.500 uIU/mL    Comment: Performed by a 3rd Generation assay with a functional sensitivity of <=0.01 uIU/mL.  Brain natriuretic peptide     Status: Abnormal   Collection Time: 09/22/17  2:30 AM  Result  Value Ref Range   B Natriuretic Peptide 293.9 (H) 0.0 - 100.0 pg/mL  Troponin I (q 6hr x 3)     Status: None   Collection Time: 09/22/17 11:07 AM  Result Value Ref Range   Troponin I <0.03 <0.03 ng/mL  CBC     Status: None   Collection Time: 09/22/17 11:07 AM  Result Value Ref Range   WBC 7.4 4.0 - 10.5 K/uL   RBC 4.39 3.87 - 5.11 MIL/uL   Hemoglobin 13.1 12.0 - 15.0 g/dL   HCT 41.0 36.0 - 46.0 %   MCV 93.4 78.0 - 100.0 fL   MCH 29.8 26.0 - 34.0 pg   MCHC 32.0 30.0 - 36.0 g/dL   RDW 15.2 11.5 - 15.5 %   Platelets 273 150 - 400 K/uL  Basic metabolic panel     Status: Abnormal   Collection Time: 09/22/17 11:07 AM  Result Value Ref Range   Sodium 136 135 - 145 mmol/L   Potassium 4.0 3.5 - 5.1 mmol/L   Chloride 101 101 - 111 mmol/L   CO2 27 22 - 32 mmol/L   Glucose, Bld 88 65 - 99 mg/dL   BUN 9 6 - 20 mg/dL   Creatinine, Ser 0.71 0.44 - 1.00 mg/dL   Calcium 8.6 (L) 8.9 - 10.3 mg/dL   GFR calc non Af Amer >60 >60 mL/min   GFR calc Af Amer >60 >60 mL/min    Comment: (NOTE) The eGFR has been calculated using the CKD EPI equation. This calculation has not been validated in all clinical situations. eGFR's persistently <60 mL/min signify possible Chronic Kidney Disease.    Anion gap 8 5 - 15  Protime-INR     Status: None   Collection Time: 09/22/17 11:07 AM  Result Value Ref Range   Prothrombin Time 14.1 11.4 - 15.2 seconds   INR 1.10     ECG   N/A  Telemetry   Atrial pacing - Personally Reviewed  Radiology    Dg Chest 2 View  Result Date: 09/21/2017 CLINICAL DATA:  Chest pain, shortness of breath and dizziness for 2 days. EXAM: CHEST  2 VIEW COMPARISON:  CT chest 08/03/2017.  PA and lateral  chest 04/17/2017 FINDINGS: Lungs are clear. Heart size is normal. No pneumothorax or pleural effusion. Aortic atherosclerosis is seen. Pacing device is in place. No acute bony abnormality. IMPRESSION: No acute disease. Atherosclerosis. Electronically Signed   By: Inge Rise  M.D.   On: 09/21/2017 12:22    Cardiac Studies   LV EF: 60% -   65%  ------------------------------------------------------------------- Indications:      CHF - 428.0.  ------------------------------------------------------------------- History:   PMH:   Atrial fibrillation.  Atrial flutter.  Risk factors:  Current tobacco use. Dyslipidemia.  ------------------------------------------------------------------- Study Conclusions  - Left ventricle: The cavity size was normal. Wall thickness was   increased in a pattern of mild LVH. Systolic function was normal.   The estimated ejection fraction was in the range of 60% to 65%.   Wall motion was normal; there were no regional wall motion   abnormalities. The study is not technically sufficient to allow   evaluation of LV diastolic function. - Aortic valve: There was trivial regurgitation. - Mitral valve: Calcified annulus. There was mild regurgitation. - Left atrium: The atrium was severely dilated. - Right atrium: The atrium was mildly dilated. - Pericardium, extracardiac: A trivial pericardial effusion was   identified.  Impressions:  - Normal LV systolic function; mild LVH; sclerotic aortic valve   with trace AI; mild MR; biatrial enlargement; mild TR.  Assessment   Active Problems:   Atrial fibrillation ([place]) 1. Acute diastolic congestive heart failure. 2. Chest pain  Plan   Remains in a-fib with RVR. Diuresed about 1L, but now hypotensive. May be a combination of a-fib and diuresis. Will give some fluid back today. Hold metoprolol. Will try to arrange for DCCV tomorrow - do not see that she has missed any doses of her Eliquis.  Time Spent Directly with Patient:  I have spent a total of 15 minutes with the patient reviewing hospital notes, telemetry, EKGs, labs and examining the patient as well as establishing an assessment and plan that was discussed personally with the patient. > 50% of time was spent in direct  patient care.  Length of Stay:  LOS: 2 days   Pixie Casino, MD, [place]  Attending Cardiologist  Direct Dial: [phone]  Fax: [phone]  Website:  [email or web address] 09/23/2017, 10:54 AM

## 2017-09-24 ENCOUNTER — Other Ambulatory Visit: Payer: Self-pay

## 2017-09-24 ENCOUNTER — Encounter (HOSPITAL_COMMUNITY)
Admission: EM | Disposition: A | Discharge status: Home / Self Care | Payer: Self-pay | Source: Home / Self Care | Attending: Cardiology

## 2017-09-24 DIAGNOSIS — R0789 Other chest pain: Secondary | ICD-10-CM

## 2017-09-24 DIAGNOSIS — I4892 Unspecified atrial flutter: Secondary | ICD-10-CM

## 2017-09-24 DIAGNOSIS — I4891 Unspecified atrial fibrillation: Secondary | ICD-10-CM

## 2017-09-24 HISTORY — PX: CARDIOVERSION: EP1203

## 2017-09-24 SURGERY — CARDIOVERSION (CATH LAB)
Anesthesia: LOCAL

## 2017-09-24 MED ORDER — FENTANYL CITRATE (PF) 100 MCG/2ML IJ SOLN
INTRAMUSCULAR | Status: AC
  Filled 2017-09-24: qty 2

## 2017-09-24 MED ORDER — MIDAZOLAM HCL 2 MG/2ML IJ SOLN
INTRAMUSCULAR | Status: DC | PRN
  Administered 2017-09-24: 2 mg via INTRAVENOUS
  Administered 2017-09-24: 1 mg via INTRAVENOUS

## 2017-09-24 MED ORDER — SODIUM CHLORIDE 0.9 % IV BOLUS (SEPSIS): 250.0000 mL | Freq: Once | INTRAVENOUS | Status: DC

## 2017-09-24 MED ORDER — FENTANYL CITRATE (PF) 100 MCG/2ML IJ SOLN
INTRAMUSCULAR | Status: DC | PRN
  Administered 2017-09-24 (×2): 25 ug via INTRAVENOUS

## 2017-09-24 MED ORDER — MIDAZOLAM HCL 5 MG/5ML IJ SOLN
INTRAMUSCULAR | Status: AC
  Filled 2017-09-24: qty 5

## 2017-09-24 SURGICAL SUPPLY — 1 items: PAD DEFIB LIFELINK (PAD) ×2 IMPLANT

## 2017-09-24 NOTE — H&P (Signed)
Donna [name] is a 76 y.o. female with a history of atrial fibrillation/flutter. She presented to the hospital in atrial flutter. On exam, tachycardic, no murmurs, lungs clear. She has been compliant with her anticoagulation. She presents for cardioversion. Risks and benefits discussed. Risks include but not limited to arrhythmia and side effects from sedation. She understands the risks and has agreed to the procedure.  [name], MD 09/24/2017 6:29 PM

## 2017-09-24 NOTE — Progress Notes (Signed)
MD on call notified of SBP running in the 80's. Pt asymptomatic. Orders received for 250cc NS bolus. Will continue to monitor. Jessie Foot, RN

## 2017-09-24 NOTE — Progress Notes (Signed)
Progress Note  Patient Name: Donna [name] Date of Encounter: 09/24/2017  Primary Cardiologist: Dr. Radford Pax and Dr. Curt Bears  Subjective   Feeling very fatigued, short of breath.  Inpatient Medications    Scheduled Meds: . amiodarone  200 mg Oral BID  . apixaban  5 mg Oral BID  . atorvastatin  10 mg Oral QHS  . cholecalciferol  5,000 Units Oral Daily  . pramipexole  0.25 mg Oral BID  . sodium chloride flush  3 mL Intravenous Q12H   Continuous Infusions: . sodium chloride     PRN Meds: alum & mag hydroxide-simeth, meclizine, sodium chloride flush   Vital Signs    Vitals:   09/24/17 0446 09/24/17 0614 09/24/17 0800 09/24/17 0810  BP:  103/79 113/78 109/83  Pulse: (!) 117 95 (!) 134   Resp: 15 (!) 21 14 (!) 21  Temp:  98 F (36.7 C)    TempSrc:  Oral    SpO2: 97% 99% 98%   Weight:  143 lb 12.8 oz (65.2 kg)    Height:        Intake/Output Summary (Last 24 hours) at 09/24/2017 1057 Last data filed at 09/24/2017 0900 Gross per 24 hour  Intake 0 ml  Output 1100 ml  Net -1100 ml   Filed Weights   09/22/17 0034 09/23/17 0539 09/24/17 0614  Weight: 148 lb 8 oz (67.4 kg) 137 lb 1.6 oz (62.2 kg) 143 lb 12.8 oz (65.2 kg)    Telemetry    Atrial fibrillation with rapid ventricular response heart rate 130s- Personally Reviewed  ECG    Atrial fibrillation with rapid ventricular response- Personally Reviewed  Physical Exam   GEN: No acute distress.   Neck: No JVD Cardiac:  Tachycardic, irregularly irregular, no murmurs, rubs, or gallops.  Respiratory: Clear to auscultation bilaterally. GI: Soft, nontender, non-distended  MS: No edema; No deformity. Neuro:  Nonfocal  Psych: Normal affect   Labs    Chemistry Recent Labs  Lab 09/21/17 1143 09/22/17 1107  NA 136 136  K 4.1 4.0  CL 102 101  CO2 28 27  GLUCOSE 88 88  BUN 9 9  CREATININE 0.74 0.71  CALCIUM 8.8* 8.6*  GFRNONAA >60 >60  GFRAA >60 >60  ANIONGAP 6 8     Hematology Recent Labs    Lab 09/21/17 1143 09/22/17 1107  WBC 5.9 7.4  RBC 4.22 4.39  HGB 12.6 13.1  HCT 39.3 41.0  MCV 93.1 93.4  MCH 29.9 29.8  MCHC 32.1 32.0  RDW 15.4 15.2  PLT 217 273    Cardiac Enzymes Recent Labs  Lab 09/21/17 2109 09/22/17 0230 09/22/17 1107  TROPONINI <0.03 <0.03 <0.03    Recent Labs  Lab 09/21/17 1215  TROPIPOC 0.01     BNP Recent Labs  Lab 09/22/17 0230  BNP 293.9*     DDimer No results for input(s): DDIMER in the last 168 hours.   Radiology    No results found.  Cardiac Studies   Echocardiogram -EF is 60%, moderate diastolic dysfunction.  Patient Profile     76 y.o. female with atrial relation rapid ventricular response currently seen by Dr. Curt Bears of electrophysiology status post pacemaker insertion and atrial fibrillation ablation in September 2018 but with minimal CAD on cardiac catheterization in 2004.  Blood pressures have been low in the 80s.  Assessment & Plan    Atrial fibrillation with rapid ventricular response -We are currently working on getting her in for a cardioversion. -Continue with amiodarone,  Eliquis. -Rate control is challenging because of hypotension.  EF normal.  She has not missed any doses of Eliquis.  CAD -Minimal, stable, no chest pain actively.  Acute diastolic heart failure - She was given gentle fluid bolus yesterday because of hypotension.  For questions or updates, please contact [place] Please consult [email or web address] for contact info under Cardiology/STEMI.      Signed, Candee Furbish, MD  09/24/2017, 10:57 AM

## 2017-09-25 ENCOUNTER — Encounter (HOSPITAL_COMMUNITY): Payer: Self-pay | Admitting: Cardiology

## 2017-09-25 ENCOUNTER — Encounter: Payer: Medicare Other | Admitting: Osteopathic Medicine

## 2017-09-25 MED ORDER — AMIODARONE HCL 200 MG PO TABS: 200.0000 mg | ORAL_TABLET | Freq: Two times a day (BID) | ORAL | 0 refills | Status: DC

## 2017-09-25 NOTE — Discharge Instructions (Signed)
Electrical Cardioversion, Care After °This sheet gives you information about how to care for yourself after your procedure. Your health care provider may also give you more specific instructions. If you have problems or questions, contact your health care provider. °What can I expect after the procedure? °After the procedure, it is common to have: °· Some redness on the skin where the shocks were given. ° °Follow these instructions at home: °· Do not drive for 24 hours if you were given a medicine to help you relax (sedative). °· Take over-the-counter and prescription medicines only as told by your health care provider. °· Ask your health care provider how to check your pulse. Check it often. °· Rest for 48 hours after the procedure or as told by your health care provider. °· Avoid or limit your caffeine use as told by your health care provider. °Contact a health care provider if: °· You feel like your heart is beating too quickly or your pulse is not regular. °· You have a serious muscle cramp that does not go away. °Get help right away if: °· You have discomfort in your chest. °· You are dizzy or you feel faint. °· You have trouble breathing or you are short of breath. °· Your speech is slurred. °· You have trouble moving an arm or leg on one side of your body. °· Your fingers or toes turn cold or blue. °This information is not intended to replace advice given to you by your health care provider. Make sure you discuss any questions you have with your health care provider. °Document Released: 08/27/2013 Document Revised: 06/09/2016 Document Reviewed: 05/12/2016 °Elsevier Interactive Patient Education © 2018 Elsevier Inc. ° °

## 2017-09-25 NOTE — Progress Notes (Signed)
Progress Note  Patient Name: Donna [name] Date of Encounter: 09/25/2017  Primary Cardiologist: Dr. Radford Pax and Dr. Curt Bears  Subjective   She is feeling better, no shortness of breath, no chest pain.  Cardioversion was successful yesterday.  Inpatient Medications    Scheduled Meds: . amiodarone  200 mg Oral BID  . apixaban  5 mg Oral BID  . atorvastatin  10 mg Oral QHS  . cholecalciferol  5,000 Units Oral Daily  . pramipexole  0.25 mg Oral BID  . sodium chloride flush  3 mL Intravenous Q12H   Continuous Infusions: . sodium chloride    . sodium chloride     PRN Meds: alum & mag hydroxide-simeth, meclizine, sodium chloride flush   Vital Signs    Vitals:   09/24/17 2105 09/24/17 2151 09/24/17 2234 09/25/17 0500  BP:  (!) 88/67 104/63 102/66  Pulse:  73 69 69  Resp:  20 14 18   Temp: 97.9 F (36.6 C)   97.8 F (36.6 C)  TempSrc:    Oral  SpO2:  95% 98% 99%  Weight:    143 lb 9.6 oz (65.1 kg)  Height:        Intake/Output Summary (Last 24 hours) at 09/25/2017 0900 Last data filed at 09/24/2017 1700 Gross per 24 hour  Intake 0 ml  Output 400 ml  Net -400 ml   Filed Weights   09/23/17 0539 09/24/17 0614 09/25/17 0500  Weight: 137 lb 1.6 oz (62.2 kg) 143 lb 12.8 oz (65.2 kg) 143 lb 9.6 oz (65.1 kg)    Telemetry    Atrial fibrillation with rapid ventricular response heart rate 130s has now been replaced with sinus rhythm first-degree AV block interventricular conduction delay heart rate 70.- Personally Reviewed  ECG    Atrial fibrillation with rapid ventricular response- Personally Reviewed  Physical Exam   GEN: Well nourished, well developed, in no acute distress  HEENT: normal  Neck: no JVD, carotid bruits, or masses Cardiac: RRR; no murmurs, rubs, or gallops,no edema  Respiratory:  clear to auscultation bilaterally, normal work of breathing GI: soft, nontender, nondistended, + BS MS: no deformity or atrophy  Skin: warm and dry, no rash Neuro:   Alert and Oriented x 3, Strength and sensation are intact Psych: euthymic mood, full affect   Labs    Chemistry Recent Labs  Lab 09/21/17 1143 09/22/17 1107  NA 136 136  K 4.1 4.0  CL 102 101  CO2 28 27  GLUCOSE 88 88  BUN 9 9  CREATININE 0.74 0.71  CALCIUM 8.8* 8.6*  GFRNONAA >60 >60  GFRAA >60 >60  ANIONGAP 6 8     Hematology Recent Labs  Lab 09/21/17 1143 09/22/17 1107  WBC 5.9 7.4  RBC 4.22 4.39  HGB 12.6 13.1  HCT 39.3 41.0  MCV 93.1 93.4  MCH 29.9 29.8  MCHC 32.1 32.0  RDW 15.4 15.2  PLT 217 273    Cardiac Enzymes Recent Labs  Lab 09/21/17 2109 09/22/17 0230 09/22/17 1107  TROPONINI <0.03 <0.03 <0.03    Recent Labs  Lab 09/21/17 1215  TROPIPOC 0.01     BNP Recent Labs  Lab 09/22/17 0230  BNP 293.9*     DDimer No results for input(s): DDIMER in the last 168 hours.   Radiology    No results found.  Cardiac Studies   Echocardiogram -EF is 60%, moderate diastolic dysfunction.  Patient Profile     76 y.o. female with atrial relation rapid ventricular response  currently seen by Dr. Curt Bears of electrophysiology status post pacemaker insertion and atrial fibrillation ablation in September 2018 but with minimal CAD on cardiac catheterization in 2004.  Blood pressures have been low in the 80s.  Assessment & Plan    Atrial fibrillation with rapid ventricular response -Successful cardioversion on 09/24/17 by Dr. Curt Bears.  Appreciate his assistance. -Continue with amiodarone, Eliquis. -Rate control is challenging because of hypotension.  EF normal.  She has not missed any doses of Eliquis.  CAD -Minimal, stable, no chest pain actively.  Doing well, stable.  Acute diastolic heart failure - She was given gentle fluid bolus previously because of hypotension.  She is feeling better, euvolemic.  We will go ahead and send her home.  I would like for her to remain on her amiodarone 200 mg twice a day at this point.  We will get her set up in  the A. fib clinic in 2 weeks with Roderic Palau, NP.  For questions or updates, please contact [place] Please consult [email or web address] for contact info under Cardiology/STEMI.       Signed, Candee Furbish, MD  09/25/2017, 9:00 AM

## 2017-09-25 NOTE — Progress Notes (Signed)
Pt discharged to home, to be transported by spouse. PIV removed, AVS reviewed. Pt left unit via wheelchair with belongings in hand. Pt to follow up in A fib clinic next week.

## 2017-09-25 NOTE — Care Management Important Message (Signed)
Important Message  Patient Details  Name: Donna [name] MRN: [id number] Date of Birth: Aug 07, 1941   Medicare Important Message Given:  Yes    Nathen May 09/25/2017, 10:32 AM

## 2017-09-25 NOTE — Discharge Summary (Signed)
Discharge Summary    Patient ID: Donna [name],  MRN: [id number], DOB/AGE: May 14, 1941 76 y.o.  Admit date: 09/21/2017 Discharge date: 09/25/2017  Primary Care Provider: [name] Primary Cardiologist: Dr. Radford Pax Primary Electrophysiologist:  Dr. Curt Bears  Discharge Diagnoses    Active Problems:   Atrial fibrillation with rapid ventricular response (HCC)   Allergies Allergies  Allergen Reactions  . Penicillins Hives    Has patient had a PCN reaction causing immediate rash, facial/tongue/throat swelling, SOB or lightheadedness with hypotension: Yes Has patient had a PCN reaction causing severe rash involving mucus membranes or skin necrosis: No Has patient had a PCN reaction that required hospitalization: No Has patient had a PCN reaction occurring within the last 10 years: No If all of the above answers are "NO", then may proceed with Cephalosporin use.   . Vancomycin Anaphylaxis  . Tape Rash    Diagnostic Studies/Procedures    Electrical cardioversion 09/24/2017 Cardioverted 1 time(s).  Cardioverted at [name]. Evaluation Findings: Post procedure EKG shows: NSR Complications: None Patient did tolerate procedure well.  Echocardiogram 09/22/2017 Study Conclusions - Left ventricle: The cavity size was normal. Wall thickness was   increased in a pattern of mild LVH. Systolic function was normal.   The estimated ejection fraction was in the range of 60% to 65%.   Wall motion was normal; there were no regional wall motion   abnormalities. The study is not technically sufficient to allow   evaluation of LV diastolic function. - Aortic valve: There was trivial regurgitation. - Mitral valve: Calcified annulus. There was mild regurgitation. - Left atrium: The atrium was severely dilated. - Right atrium: The atrium was mildly dilated. - Pericardium, extracardiac: A trivial pericardial effusion was   identified.  Impressions: - Normal LV systolic function;  mild LVH; sclerotic aortic valve   with trace AI; mild MR; biatrial enlargement; mild TR. _____________   History of Present Illness     Donna [name] is a 76 y.o. female with a history of atrial fibrillation s/p ablation in 07/2017, s/p PPM insertion, HLD, minimal CAD in 2004 by cath.The patient has a history of paroxysmal atrial fibrillation for a number of years.  She had a catheterization showing normal coronary arteries while in [place] in 2004.  She eventually had a pacemaker implanted several years ago which is a [place] and eventually because of recurrent paroxysmal atrial fibrillation had a catheter ablation on September 21.  She did relatively well initially and when seen a week ago in the atrial fibrillation clinic was doing fairly well.  [name] presented with a 2 week history of orthopnea, bendopnea and chest pain. Chest pain occurs mostly when she bends over, denies exertional chest pain. She notes weight gain, usually takes lasix prn, but has been taking it daily.   Upon arrival to the ED, EKG shows atrial fibrillation. Troponin negative. Last Echo was in 10/2016 EF 55-60%, moderate diastolic dysfunction.    Hospital Course     Consultants: Electrophysiology for electrical cardioversion  Atrial fibrillation with rapid ventricular response -Successful cardioversion on 09/24/17 by Dr. Curt Bears.  Appreciate his assistance. -Continue with amiodarone, Eliquis. -Rate control is challenging because of hypotension.  EF normal.  She has not missed any doses of Eliquis.  CAD -Minimal, stable, no chest pain actively.  Doing well, stable.  Acute diastolic heart failure - She was given gentle fluid bolus previously because of hypotension.  She is feeling better, euvolemic.  We will go ahead and send  her home.  I would like for her to remain on her amiodarone 200 mg twice a day at this point.  We will get her set up in the A. fib clinic in 2 weeks with  Roderic Palau, NP.  Patient has been seen by Dr. Marlou Porch today and deemed ready for discharge home. All follow up appointments have been scheduled. Discharge medications are listed below.  _____________  Discharge Vitals Blood pressure 102/66, pulse 69, temperature 97.8 F (36.6 C), temperature source Oral, resp. rate 18, height 5\' 4"  (1.626 m), weight 143 lb 9.6 oz (65.1 kg), SpO2 99 %.  Filed Weights   09/23/17 0539 09/24/17 0614 09/25/17 0500  Weight: 137 lb 1.6 oz (62.2 kg) 143 lb 12.8 oz (65.2 kg) 143 lb 9.6 oz (65.1 kg)    Labs & Radiologic Studies    CBC No results for input(s): WBC, NEUTROABS, HGB, HCT, MCV, PLT in the last 72 hours. Basic Metabolic Panel No results for input(s): NA, K, CL, CO2, GLUCOSE, BUN, CREATININE, CALCIUM, MG, PHOS in the last 72 hours. Liver Function Tests No results for input(s): AST, ALT, ALKPHOS, BILITOT, PROT, ALBUMIN in the last 72 hours. No results for input(s): LIPASE, AMYLASE in the last 72 hours. Cardiac Enzymes No results for input(s): CKTOTAL, CKMB, CKMBINDEX, TROPONINI in the last 72 hours. BNP Invalid input(s): POCBNP D-Dimer No results for input(s): DDIMER in the last 72 hours. Hemoglobin A1C No results for input(s): HGBA1C in the last 72 hours. Fasting Lipid Panel No results for input(s): CHOL, HDL, LDLCALC, TRIG, CHOLHDL, LDLDIRECT in the last 72 hours. Thyroid Function Tests No results for input(s): TSH, T4TOTAL, T3FREE, THYROIDAB in the last 72 hours.  Invalid input(s): FREET3 _____________  Dg Chest 2 View  Result Date: 09/21/2017 CLINICAL DATA:  Chest pain, shortness of breath and dizziness for 2 days. EXAM: CHEST  2 VIEW COMPARISON:  CT chest 08/03/2017.  PA and lateral chest 04/17/2017 FINDINGS: Lungs are clear. Heart size is normal. No pneumothorax or pleural effusion. Aortic atherosclerosis is seen. Pacing device is in place. No acute bony abnormality. IMPRESSION: No acute disease. Atherosclerosis. Electronically Signed    By: Inge Rise M.D.   On: 09/21/2017 12:22   Disposition   Pt is being discharged home today in good condition.  Follow-up Plans & Appointments    Follow-up Information    [facility] Follow up.   Specialty:  Cardiology Why:  On Thursday November 15th at 1:30 with Roderic Palau, NP Contact information: [address] [id number] [place] [phone]         Discharge Instructions    ([place]) Call MD:  Anytime you have any of the following symptoms: 1) 3 pound weight gain in 24 hours or 5 pounds in 1 week 2) shortness of breath, with or without a dry hacking cough 3) swelling in the hands, feet or stomach 4) if you have to sleep on extra pillows at night in order to breathe.   Complete by:  As directed    Diet - low sodium heart healthy   Complete by:  As directed    Increase activity slowly   Complete by:  As directed       Discharge Medications   Current Discharge Medication List    CONTINUE these medications which have CHANGED   Details  amiodarone (PACERONE) 200 MG tablet Take 1 tablet (200 mg total) 2 (two) times daily by mouth. Qty: 60 tablet, Refills: 0  CONTINUE these medications which have NOT CHANGED   Details  atorvastatin (LIPITOR) 10 MG tablet Take 1 tablet (10 mg total) by mouth at bedtime. Qty: 90 tablet, Refills: 3   Associated Diagnoses: Status post ablation of atrial fibrillation    Cholecalciferol (VITAMIN D3) 5000 units CAPS Take 5,000 Units by mouth daily.    Associated Diagnoses: Vitamin D deficiency    co-enzyme Q-10 30 MG capsule Take 30 mg by mouth 2 (two) times daily.     denosumab (PROLIA) 60 MG/ML SOLN injection Inject 60 mg into the skin every 6 (six) months. Administer in upper arm, thigh, or abdomen   Associated Diagnoses: Osteoporosis without current pathological fracture, unspecified osteoporosis type    ELIQUIS 5 MG TABS tablet Take 1 tablet  (5 mg total) by mouth 2 (two) times daily. Qty: 180 tablet, Refills: 3   Associated Diagnoses: Atrial fibrillation, unspecified type (HCC)    Ferrous Sulfate (IRON) 325 (65 Fe) MG TABS Take 325 mg by mouth daily with breakfast.    Associated Diagnoses: History of anemia    folic acid (FOLVITE) 893 MCG tablet Take 800 mcg by mouth daily.   Associated Diagnoses: History of anemia    furosemide (LASIX) 20 MG tablet Take 1 tablet (20 mg total) by mouth as directed. Take as directed Qty: 30 tablet, Refills: 9    loperamide (IMODIUM) 2 MG capsule Take 2 mg by mouth See admin instructions. After AM bowel movement (as needed for leakage)    meclizine (ANTIVERT) 12.5 MG tablet TAKE ONE TABLET BY MOUTH THREE TIMES DAILY AS NEEDED FOR DIZZINESS Qty: 90 tablet, Refills: 3   Associated Diagnoses: Benign paroxysmal positional vertigo, unspecified laterality    ondansetron (ZOFRAN-ODT) 8 MG disintegrating tablet Take 1 tablet (8 mg total) by mouth every 8 (eight) hours as needed for nausea. Qty: 20 tablet, Refills: 3   Associated Diagnoses: Right lower quadrant abdominal pain    pramipexole (MIRAPEX) 0.25 MG tablet Take 1 tablet (0.25 mg total) by mouth 2 (two) times daily. Qty: 180 tablet, Refills: 3   Associated Diagnoses: Restless leg syndrome    potassium chloride (K-DUR) 10 MEQ tablet Take 1 tablet (10 mEq total) by mouth daily as needed. When you take Lasix Qty: 15 tablet, Refills: 1      STOP taking these medications     metoprolol tartrate (LOPRESSOR) 25 MG tablet      modafinil (PROVIGIL) 200 MG tablet            Outstanding Labs/Studies   none  Duration of Discharge Encounter   Greater than 30 minutes including physician time.  Signed, Daune Perch NP 09/25/2017, 11:02 AM  Personally seen and examined. Agree with above.   Atrial fibrillation with rapid ventricular response -Successful cardioversion on 09/24/17 by Dr. Curt Bears.  Appreciate his assistance. -Continue  with amiodarone, Eliquis. -Rate control is challenging because of hypotension.  EF normal.  She has not missed any doses of Eliquis.  CAD -Minimal, stable, no chest pain actively.  Doing well, stable.  Acute diastolic heart failure - She was given gentle fluid bolus previously because of hypotension.  She is feeling better, euvolemic.  We will go ahead and send her home.  I would like for her to remain on her amiodarone 200 mg twice a day at this point.  We will get her set up in the A. fib clinic in 2 weeks with Roderic Palau, NP.  Candee Furbish, MD

## 2017-10-04 ENCOUNTER — Ambulatory Visit (HOSPITAL_COMMUNITY)
Admission: RE | Admit: 2017-10-04 | Discharge: 2017-10-04 | Disposition: A | Discharge status: Home / Self Care | Payer: Medicare Other | Source: Ambulatory Visit | Attending: Nurse Practitioner | Admitting: Nurse Practitioner

## 2017-10-04 ENCOUNTER — Encounter (HOSPITAL_COMMUNITY): Payer: Self-pay | Admitting: Nurse Practitioner

## 2017-10-04 VITALS — BP 144/90 | HR 60 | Ht 64.0 in | Wt 147.0 lb

## 2017-10-04 DIAGNOSIS — I4891 Unspecified atrial fibrillation: Secondary | ICD-10-CM | POA: Diagnosis not present

## 2017-10-04 DIAGNOSIS — Z87891 Personal history of nicotine dependence: Secondary | ICD-10-CM | POA: Insufficient documentation

## 2017-10-04 DIAGNOSIS — G2581 Restless legs syndrome: Secondary | ICD-10-CM | POA: Diagnosis not present

## 2017-10-04 DIAGNOSIS — Z7901 Long term (current) use of anticoagulants: Secondary | ICD-10-CM | POA: Insufficient documentation

## 2017-10-04 DIAGNOSIS — I481 Persistent atrial fibrillation: Secondary | ICD-10-CM | POA: Diagnosis not present

## 2017-10-04 DIAGNOSIS — Z79899 Other long term (current) drug therapy: Secondary | ICD-10-CM | POA: Diagnosis not present

## 2017-10-04 DIAGNOSIS — I252 Old myocardial infarction: Secondary | ICD-10-CM | POA: Diagnosis not present

## 2017-10-04 DIAGNOSIS — Z881 Allergy status to other antibiotic agents status: Secondary | ICD-10-CM | POA: Diagnosis not present

## 2017-10-04 DIAGNOSIS — I4819 Other persistent atrial fibrillation: Secondary | ICD-10-CM

## 2017-10-04 DIAGNOSIS — E785 Hyperlipidemia, unspecified: Secondary | ICD-10-CM | POA: Diagnosis not present

## 2017-10-04 DIAGNOSIS — Z9889 Other specified postprocedural states: Secondary | ICD-10-CM | POA: Insufficient documentation

## 2017-10-04 DIAGNOSIS — G4730 Sleep apnea, unspecified: Secondary | ICD-10-CM | POA: Diagnosis not present

## 2017-10-04 DIAGNOSIS — Z9884 Bariatric surgery status: Secondary | ICD-10-CM | POA: Diagnosis not present

## 2017-10-04 DIAGNOSIS — G47419 Narcolepsy without cataplexy: Secondary | ICD-10-CM | POA: Insufficient documentation

## 2017-10-04 DIAGNOSIS — E559 Vitamin D deficiency, unspecified: Secondary | ICD-10-CM | POA: Diagnosis not present

## 2017-10-04 DIAGNOSIS — Z950 Presence of cardiac pacemaker: Secondary | ICD-10-CM | POA: Diagnosis not present

## 2017-10-04 DIAGNOSIS — I2510 Atherosclerotic heart disease of native coronary artery without angina pectoris: Secondary | ICD-10-CM | POA: Insufficient documentation

## 2017-10-04 DIAGNOSIS — Z880 Allergy status to penicillin: Secondary | ICD-10-CM | POA: Insufficient documentation

## 2017-10-04 NOTE — Patient Instructions (Signed)
Decrease Amiodarone to 200mg  once a day in 2 weeks.

## 2017-10-04 NOTE — Progress Notes (Signed)
Primary Care Physician: [name], DO Referring Physician: [facility] ER f/u EP:  Dr. Arty Baumgartner Trosper is a 76 y.o. female with a h/o, PPM, persistent afib, pt of Dr. Curt Bears that had failed flecainide and most recently on amiodarone. She had an ablation one month ago after having increased afib burden. She was seen f/u in afib clinic 10/24 at which time she was doing well.  She developed afib with chest tightness 11/2 and was admitted to the hospital.  Successful cardioversion 11/5. She is in the afib clinic for f/u. She noted some elevated HR one night around 100 bpm, but next morning it was back in the 70's and has continued to be 60-70's.   Today, she denies symptoms of palpitations, chest pain, shortness of breath, orthopnea, PND, lower extremity edema, dizziness, presyncope, syncope, or neurologic sequela. The patient is tolerating medications without difficulties and is otherwise without complaint today.   Past Medical History:  Diagnosis Date  . Atrial fibrillation ([place]) 10/17/2016   a. On Metoprolol, Eliquis, amio;  b. 07/2017 s/p RFCA.  [name] Benign paroxysmal positional vertigo 10/18/2016  . CAD (coronary atherosclerotic disease) 10/17/2016   Hx MI  . Cystitis 10/18/2016  . Dysuria 10/18/2016  . History of anemia 10/18/2016  . History of gastric bypass 10/17/2016  . Hyperlipidemia 10/17/2016  . Lower extremity edema 10/18/2016  . Restless leg syndrome 10/17/2016  . Sleep disorder 10/17/2016   Modafinil for sleep apnea and narcolepsy  . Tobacco dependence 10/17/2016  . Urinary frequency 10/18/2016  . Vitamin D deficiency 10/18/2016   Past Surgical History:  Procedure Laterality Date  . ATRIAL FIBRILLATION ABLATION N/A 08/10/2017   Procedure: Atrial Fibrillation Ablation;  Surgeon: [name], MD;  Location: [facility];  Service: Cardiovascular;  Laterality: N/A;  . CARDIAC CATHETERIZATION    . CARDIOVERSION N/A 04/27/2017   Procedure:  CARDIOVERSION;  Surgeon: [name], MD;  Location: [facility] ENDOSCOPY;  Service: Cardiovascular;  Laterality: N/A;  . CARDIOVERSION N/A 05/24/2017   Procedure: CARDIOVERSION;  Surgeon: [name], MD;  Location: [facility] ENDOSCOPY;  Service: Cardiovascular;  Laterality: N/A;  . CARDIOVERSION N/A 09/24/2017   Procedure: CARDIOVERSION;  Surgeon: [name], MD;  Location: [facility];  Service: Cardiovascular;  Laterality: N/A;    Current Outpatient Medications  Medication Sig Dispense Refill  . amiodarone (PACERONE) 200 MG tablet Take 1 tablet (200 mg total) 2 (two) times daily by mouth. 60 tablet 0  . atorvastatin (LIPITOR) 10 MG tablet Take 1 tablet (10 mg total) by mouth at bedtime. 90 tablet 3  . Cholecalciferol (VITAMIN D3) 5000 units CAPS Take 5,000 Units by mouth daily.     [name] co-enzyme Q-10 30 MG capsule Take 30 mg by mouth 2 (two) times daily.     [name] denosumab (PROLIA) 60 MG/ML SOLN injection Inject 60 mg into the skin every 6 (six) months. Administer in upper arm, thigh, or abdomen    . ELIQUIS 5 MG TABS tablet Take 1 tablet (5 mg total) by mouth 2 (two) times daily. 180 tablet 3  . Ferrous Sulfate (IRON) 325 (65 Fe) MG TABS Take 325 mg by mouth daily with breakfast.     . folic acid (FOLVITE) 706 MCG tablet Take 800 mcg by mouth daily.    . furosemide (LASIX) 20 MG tablet Take 1 tablet (20 mg total) by mouth as directed. Take as directed (Patient taking differently: Take 20 mg by mouth as needed  for fluid. Take as directed) 30 tablet 9  . loperamide (IMODIUM) 2 MG capsule Take 2 mg by mouth See admin instructions. After AM bowel movement (as needed for leakage)    . meclizine (ANTIVERT) 12.5 MG tablet TAKE ONE TABLET BY MOUTH THREE TIMES DAILY AS NEEDED FOR DIZZINESS (Patient taking differently: TAKE ONE TABLET BY MOUTH TWICE A DAY, every day.) 90 tablet 3  . metoprolol tartrate (LOPRESSOR) 25 MG tablet 1 tablet 2 (two) times daily.    . ondansetron (ZOFRAN-ODT) 8 MG  disintegrating tablet Take 1 tablet (8 mg total) by mouth every 8 (eight) hours as needed for nausea. 20 tablet 3  . potassium chloride (K-DUR) 10 MEQ tablet Take 1 tablet (10 mEq total) by mouth daily as needed. When you take Lasix 15 tablet 1  . pramipexole (MIRAPEX) 0.25 MG tablet Take 1 tablet (0.25 mg total) by mouth 2 (two) times daily. 180 tablet 3   No current facility-administered medications for this encounter.     Allergies  Allergen Reactions  . Penicillins Hives    Has patient had a PCN reaction causing immediate rash, facial/tongue/throat swelling, SOB or lightheadedness with hypotension: Yes Has patient had a PCN reaction causing severe rash involving mucus membranes or skin necrosis: No Has patient had a PCN reaction that required hospitalization: No Has patient had a PCN reaction occurring within the last 10 years: No If all of the above answers are "NO", then may proceed with Cephalosporin use.   . Vancomycin Anaphylaxis  . Tape Rash    Social History   Socioeconomic History  . Marital status: Widowed    Spouse name: Not on file  . Number of children: Not on file  . Years of education: Not on file  . Highest education level: Not on file  Social Needs  . Financial resource strain: Not on file  . Food insecurity - worry: Not on file  . Food insecurity - inability: Not on file  . Transportation needs - medical: Not on file  . Transportation needs - non-medical: Not on file  Occupational History  . Not on file  Tobacco Use  . Smoking status: Former Smoker    Packs/day: 1.00    Years: 55.00    Pack years: 55.00  . Smokeless tobacco: Never Used  Substance and Sexual Activity  . Alcohol use: No  . Drug use: No  . Sexual activity: No  Other Topics Concern  . Not on file  Social History Narrative  . Not on file    Family History  Problem Relation Age of Onset  . Emphysema Mother   . Heart disease Father   . Diabetes Father   . Emphysema Father   .  Hypertension Sister   . Bladder Cancer Brother   . Hypertension Brother   . Heart attack Maternal Grandmother   . Kidney failure Paternal Grandmother   . Stroke Paternal Grandfather   . Hypertension Brother     ROS- All systems are reviewed and negative except as per the HPI above  Physical Exam: Vitals:   10/04/17 1327  BP: (!) 144/90  Pulse: 60  Weight: 147 lb (66.7 kg)  Height: 5\' 4"  (1.626 m)   Wt Readings from Last 3 Encounters:  10/04/17 147 lb (66.7 kg)  09/25/17 143 lb 9.6 oz (65.1 kg)  09/12/17 150 lb (68 kg)    Labs: Lab Results  Component Value Date   NA 136 09/22/2017   K 4.0 09/22/2017  CL 101 09/22/2017   CO2 27 09/22/2017   GLUCOSE 88 09/22/2017   BUN 9 09/22/2017   CREATININE 0.71 09/22/2017   CALCIUM 8.6 (L) 09/22/2017   Lab Results  Component Value Date   INR 1.10 09/22/2017   Lab Results  Component Value Date   CHOL 214 (H) 01/18/2017   HDL 109 01/18/2017   LDLCALC 89 01/18/2017   TRIG 81 01/18/2017     GEN- The patient is well appearing, alert and oriented x 3 today.   Head- normocephalic, atraumatic Eyes-  Sclera clear, conjunctiva pink Ears- hearing intact Oropharynx- clear Neck- supple, no JVP Lymph- no cervical lymphadenopathy Lungs- Clear to ausculation bilaterally, normal work of breathing Heart-  regular rate and rhythm, no murmurs, rubs or gallops, PMI not laterally displaced GI- soft, NT, ND, + BS Extremities- no clubbing, cyanosis, or edema MS- no significant deformity or atrophy Skin- no rash or lesion Psych- euthymic mood, full affect Neuro- strength and sensation are intact  EKG- atrial paced at 60 bpm, pr int 388 ms, qrs int 112 ms, qtc 492 ms Epic records reviewed   Assessment and Plan: 1. Persistent symptomatic afib S/p ablation x one month and has successful cardioversion 11/5 while hospitalized Continue amiodarone at 200 mg bid x 2 more weeks and then decrease to 200 mg daily Continue metoprolol to 25 mg  bid Anticipate pt to come off amiodarone at 3 month check with Dr. Curt Bears  Continue eliquis 5 mg bid for chadsvasc score of  at least 3, reminded no missed doses  F/u remote pacer check 12/4 Dr. Curt Bears 1/8   Geroge Baseman. Meziah Blasingame, [facility] [address] [place], [place] [address] [phone]

## 2017-10-18 ENCOUNTER — Encounter: Payer: Self-pay | Admitting: Osteopathic Medicine

## 2017-10-18 NOTE — Progress Notes (Signed)
Dog has been helping blood pressure and anxiety

## 2017-10-23 ENCOUNTER — Telehealth: Payer: Self-pay | Admitting: Osteopathic Medicine

## 2017-10-23 ENCOUNTER — Ambulatory Visit (INDEPENDENT_AMBULATORY_CARE_PROVIDER_SITE_OTHER): Payer: Medicare Other | Admitting: *Deleted

## 2017-10-23 DIAGNOSIS — I495 Sick sinus syndrome: Secondary | ICD-10-CM | POA: Diagnosis not present

## 2017-10-23 DIAGNOSIS — M810 Age-related osteoporosis without current pathological fracture: Secondary | ICD-10-CM

## 2017-10-23 LAB — CUP PACEART REMOTE DEVICE CHECK
Battery Remaining Percentage: 100 %
Brady Statistic RA Percent Paced: 40 %
Date Time Interrogation Session: 20181204075100
Implantable Lead Implant Date: 20150324
Implantable Lead Implant Date: 20150324
Implantable Lead Location: 753860
Implantable Lead Model: 4456
Implantable Lead Serial Number: 531114
Implantable Pulse Generator Implant Date: 20150324
Lead Channel Pacing Threshold Pulse Width: 0.4 ms
Lead Channel Pacing Threshold Pulse Width: 0.5 ms
Lead Channel Setting Pacing Amplitude: 2 V
Lead Channel Setting Pacing Pulse Width: 0.4 ms
Lead Channel Setting Sensing Sensitivity: 2.5 mV
MDC IDC LEAD LOCATION: 753859
MDC IDC LEAD SERIAL: 479154
MDC IDC MSMT BATTERY REMAINING LONGEVITY: 66 mo
MDC IDC MSMT LEADCHNL RA IMPEDANCE VALUE: 494 Ohm
MDC IDC MSMT LEADCHNL RA PACING THRESHOLD AMPLITUDE: 0.4 V
MDC IDC MSMT LEADCHNL RV IMPEDANCE VALUE: 543 Ohm
MDC IDC MSMT LEADCHNL RV PACING THRESHOLD AMPLITUDE: 1 V
MDC IDC SET LEADCHNL RA PACING AMPLITUDE: 2 V
MDC IDC STAT BRADY RV PERCENT PACED: 12 %
Pulse Gen Serial Number: 391090

## 2017-10-23 NOTE — Telephone Encounter (Signed)
Pt has gotten lab slip to get blood work. Will need prolia shot after results come back. Thanks

## 2017-10-23 NOTE — Telephone Encounter (Signed)
Labs completed, Prolia ordered. No auth required.

## 2017-10-23 NOTE — Progress Notes (Signed)
Remote pacemaker transmission.   

## 2017-10-23 NOTE — Telephone Encounter (Signed)
Needs labs prior to next Prolia injection, orders are in can we make sure we have it in stock and pt can schedule nurse visit?

## 2017-10-24 ENCOUNTER — Encounter: Payer: Self-pay | Admitting: Cardiology

## 2017-10-25 DIAGNOSIS — M810 Age-related osteoporosis without current pathological fracture: Secondary | ICD-10-CM | POA: Diagnosis not present

## 2017-10-25 LAB — COMPLETE METABOLIC PANEL WITH GFR
AG RATIO: 1.4 (calc) (ref 1.0–2.5)
ALKALINE PHOSPHATASE (APISO): 46 U/L (ref 33–130)
ALT: 11 U/L (ref 6–29)
AST: 16 U/L (ref 10–35)
Albumin: 3.8 g/dL (ref 3.6–5.1)
BILIRUBIN TOTAL: 0.4 mg/dL (ref 0.2–1.2)
BUN: 17 mg/dL (ref 7–25)
CALCIUM: 9.5 mg/dL (ref 8.6–10.4)
CHLORIDE: 103 mmol/L (ref 98–110)
CO2: 33 mmol/L — ABNORMAL HIGH (ref 20–32)
Creat: 0.89 mg/dL (ref 0.60–0.93)
GFR, EST NON AFRICAN AMERICAN: 64 mL/min/{1.73_m2} (ref >=60)
GFR, Est African American: 75 mL/min/{1.73_m2} (ref >=60)
GLOBULIN: 2.8 g/dL (ref 1.9–3.7)
Glucose, Bld: 85 mg/dL (ref 65–99)
POTASSIUM: 4.7 mmol/L (ref 3.5–5.3)
SODIUM: 142 mmol/L (ref 135–146)
Total Protein: 6.6 g/dL (ref 6.1–8.1)

## 2017-10-31 ENCOUNTER — Ambulatory Visit (INDEPENDENT_AMBULATORY_CARE_PROVIDER_SITE_OTHER): Payer: Medicare Other | Admitting: Osteopathic Medicine

## 2017-10-31 DIAGNOSIS — M810 Age-related osteoporosis without current pathological fracture: Secondary | ICD-10-CM

## 2017-10-31 DIAGNOSIS — Z298 Encounter for other specified prophylactic measures: Secondary | ICD-10-CM

## 2017-10-31 MED ORDER — DENOSUMAB 60 MG/ML ~~LOC~~ SOLN
60.0000 mg | Freq: Once | SUBCUTANEOUS | Status: AC
  Administered 2017-10-31: 60 mg via SUBCUTANEOUS

## 2017-10-31 NOTE — Progress Notes (Signed)
   Subjective:    Patient ID: Donna [name], female    DOB: November 24, 1940, 76 y.o.   MRN: [id number]  HPI patient here for q 6 month injection of Prolia. She denies any problems with first dose other than some soreness at site briefly afterwards. She is taking daily dose of calcium and vitamin d.    Review of Systems     Objective:   Physical Exam        Assessment & Plan:  Patient has normal Calcium level on her CMP done 10/25/17. She tolerated today's injection well without complications. Advised to rtc for next injection in 6 months and have a calcium level drawn within 30 days prior to injection. pk

## 2017-11-06 ENCOUNTER — Other Ambulatory Visit: Payer: Self-pay | Admitting: Osteopathic Medicine

## 2017-11-06 DIAGNOSIS — G2581 Restless legs syndrome: Secondary | ICD-10-CM

## 2017-11-16 ENCOUNTER — Telehealth: Payer: Self-pay | Admitting: Cardiology

## 2017-11-16 MED ORDER — AMIODARONE HCL 200 MG PO TABS: 200.0000 mg | ORAL_TABLET | Freq: Every day | ORAL | 3 refills | Status: DC

## 2017-11-16 NOTE — Telephone Encounter (Signed)
This is a A-fib pt. °

## 2017-11-16 NOTE — Telephone Encounter (Signed)
New message    Patient calling to confirm if she needs to continue amiodarone (PACERONE) 200 MG tablet. No refills on medication.   *STAT* If patient is at the pharmacy, call can be transferred to refill team.   1. Which medications need to be refilled? (please list name of each medication and dose if known) amiodarone (PACERONE) 200 MG tablet  2. Which pharmacy/location (including street and city if local pharmacy) is medication to be sent to? [place]  3. Do they need a 30 day or 90 day supply? [place]

## 2017-11-27 ENCOUNTER — Encounter: Payer: Medicare Other | Admitting: Cardiology

## 2017-12-06 ENCOUNTER — Encounter: Payer: Self-pay | Admitting: Cardiology

## 2017-12-06 ENCOUNTER — Ambulatory Visit: Payer: Medicare HMO | Admitting: Cardiology

## 2017-12-06 VITALS — BP 134/76 | HR 64 | Ht 64.0 in | Wt 148.0 lb

## 2017-12-06 DIAGNOSIS — E785 Hyperlipidemia, unspecified: Secondary | ICD-10-CM | POA: Diagnosis not present

## 2017-12-06 DIAGNOSIS — I10 Essential (primary) hypertension: Secondary | ICD-10-CM | POA: Diagnosis not present

## 2017-12-06 DIAGNOSIS — I2510 Atherosclerotic heart disease of native coronary artery without angina pectoris: Secondary | ICD-10-CM

## 2017-12-06 DIAGNOSIS — I481 Persistent atrial fibrillation: Secondary | ICD-10-CM | POA: Diagnosis not present

## 2017-12-06 DIAGNOSIS — I4819 Other persistent atrial fibrillation: Secondary | ICD-10-CM

## 2017-12-06 LAB — CUP PACEART INCLINIC DEVICE CHECK
Date Time Interrogation Session: 20190117050000
Implantable Lead Implant Date: 20150324
Implantable Lead Location: 753860
Implantable Lead Model: 4456
Implantable Lead Serial Number: 479154
Lead Channel Impedance Value: 544 Ohm
Lead Channel Pacing Threshold Amplitude: 1 V
Lead Channel Pacing Threshold Pulse Width: 0.4 ms
Lead Channel Sensing Intrinsic Amplitude: 11.3 mV
Lead Channel Sensing Intrinsic Amplitude: 3.7 mV
Lead Channel Setting Pacing Amplitude: 2 V
Lead Channel Setting Pacing Pulse Width: 0.4 ms
MDC IDC LEAD IMPLANT DT: 20150324
MDC IDC LEAD LOCATION: 753859
MDC IDC LEAD SERIAL: 531114
MDC IDC MSMT LEADCHNL RA IMPEDANCE VALUE: 508 Ohm
MDC IDC MSMT LEADCHNL RA PACING THRESHOLD AMPLITUDE: 0.5 V
MDC IDC MSMT LEADCHNL RA PACING THRESHOLD PULSEWIDTH: 0.5 ms
MDC IDC PG IMPLANT DT: 20150324
MDC IDC PG SERIAL: 391090
MDC IDC SET LEADCHNL RV PACING AMPLITUDE: 2 V
MDC IDC SET LEADCHNL RV SENSING SENSITIVITY: 2.5 mV

## 2017-12-06 NOTE — Progress Notes (Signed)
Electrophysiology Office Note   Date:  12/06/2017   ID:  Donna [name], DOB 1941/01/23, MRN [id number]  PCP:  [name], DO  Primary Electrophysiologist:  [name], MD    Chief Complaint  Patient presents with  . Pacemaker Check    Sick sinus syndrome/Persistent Afib     History of Present Illness: Donna [name] is a 77 y.o. female who presents today for electrophysiology evaluation.   She has a history of atrial fibrillation, and a pacemaker placed for her atrial fibrillation. She has had multiple hospitalizations and cardioversion for her atrial fibrillation. He continued to feel poorly due to her atrial fibrillation.  She had an atrial fibrillation ablation on 08/10/17.  She went back into atrial fibrillation and had cardioversion 09/24/17.  Today, denies symptoms of palpitations, chest pain, shortness of breath, orthopnea, PND, lower extremity edema, claudication, dizziness, presyncope, syncope, bleeding, or neurologic sequela. The patient is tolerating medications without difficulties.  Since her cardioversion she has done well without further episodes of atrial fibrillation.  Of note, her husband did require pacemaker implantation this past Monday. Past Medical History:  Diagnosis Date  . Atrial fibrillation ([place]) 10/17/2016   a. On Metoprolol, Eliquis, amio;  b. 07/2017 s/p RFCA.  [name] Benign paroxysmal positional vertigo 10/18/2016  . CAD (coronary atherosclerotic disease) 10/17/2016   Hx MI  . Cystitis 10/18/2016  . Dysuria 10/18/2016  . History of anemia 10/18/2016  . History of gastric bypass 10/17/2016  . Hyperlipidemia 10/17/2016  . Lower extremity edema 10/18/2016  . Restless leg syndrome 10/17/2016  . Sleep disorder 10/17/2016   Modafinil for sleep apnea and narcolepsy  . Tobacco dependence 10/17/2016  . Urinary frequency 10/18/2016  . Vitamin D deficiency 10/18/2016   Past Surgical History:  Procedure Laterality Date  . ATRIAL  FIBRILLATION ABLATION N/A 08/10/2017   Procedure: Atrial Fibrillation Ablation;  Surgeon: [name], MD;  Location: [place] [facility];  Service: Cardiovascular;  Laterality: N/A;  . CARDIAC CATHETERIZATION    . CARDIOVERSION N/A 04/27/2017   Procedure: CARDIOVERSION;  Surgeon: [name], MD;  Location[facility] ENDOSCOPY;  Service: Cardiovascular;  Laterality: N/A;  . CARDIOVERSION N/A 05/24/2017   Procedure: CARDIOVERSION;  Surgeon: [name], MD;  Location: [facility] ENDOSCOPY;  Service: Cardiovascular;  Laterality: N/A;  . CARDIOVERSION N/A 09/24/2017   Procedure: CARDIOVERSION;  Surgeon: [name], MD;  Location: [place] [facility] LAB;  Service: Cardiovascular;  Laterality: N/A;     Current Outpatient Medications  Medication Sig Dispense Refill  . amiodarone (PACERONE) 200 MG tablet Take 1 tablet (200 mg total) by mouth daily. 30 tablet 3  . atorvastatin (LIPITOR) 10 MG tablet Take 1 tablet (10 mg total) by mouth at bedtime. 90 tablet 3  . Cholecalciferol (VITAMIN D3) 5000 units CAPS Take 5,000 Units by mouth daily.     [name] co-enzyme Q-10 30 MG capsule Take 30 mg by mouth 2 (two) times daily.     [name] ELIQUIS 5 MG TABS tablet Take 1 tablet (5 mg total) by mouth 2 (two) times daily. 180 tablet 3  . Ferrous Sulfate (IRON) 325 (65 Fe) MG TABS Take 325 mg by mouth daily with breakfast.     . folic acid (FOLVITE) 254 MCG tablet Take 800 mcg by mouth daily.    . furosemide (LASIX) 20 MG tablet Take 1 tablet (20 mg total) by mouth as directed. Take as directed (Patient taking differently: Take 20 mg by mouth as needed for fluid. Take  as directed) 30 tablet 9  . loperamide (IMODIUM) 2 MG capsule Take 2 mg by mouth See admin instructions. After AM bowel movement (as needed for leakage)    . meclizine (ANTIVERT) 12.5 MG tablet TAKE ONE TABLET BY MOUTH THREE TIMES DAILY AS NEEDED FOR DIZZINESS (Patient taking differently: TAKE ONE TABLET BY MOUTH TWICE A DAY, every day.) 90 tablet 3  .  metoprolol tartrate (LOPRESSOR) 25 MG tablet 1 tablet 2 (two) times daily.    . ondansetron (ZOFRAN-ODT) 8 MG disintegrating tablet Take 1 tablet (8 mg total) by mouth every 8 (eight) hours as needed for nausea. 20 tablet 3  . potassium chloride (K-DUR) 10 MEQ tablet Take 1 tablet (10 mEq total) by mouth daily as needed. When you take Lasix 15 tablet 1  . pramipexole (MIRAPEX) 0.25 MG tablet TAKE ONE TABLET BY MOUTH TWICE DAILY 60 tablet 11   No current facility-administered medications for this visit.     Allergies:   Penicillins; Vancomycin; and Tape   Social History:  The patient  reports that she has quit smoking. She has a 55.00 pack-year smoking history. she has never used smokeless tobacco. She reports that she does not drink alcohol or use drugs.   Family History:  The patient's family history includes Bladder Cancer in her brother; Diabetes in her father; Emphysema in her father and mother; Heart attack in her maternal grandmother; Heart disease in her father; Hypertension in her brother, brother, and sister; Kidney failure in her paternal grandmother; Stroke in her paternal grandfather.    ROS:  Please see the history of present illness.   Otherwise, review of systems is positive for weight gain, chills, leg swelling, nausea, anxiety, difficulty urinating, balance problems, headaches, dizziness.   All other systems are reviewed and negative.   PHYSICAL EXAM: VS:  BP 134/76   Pulse 64   Ht 5\' 4"  (1.626 m)   Wt 148 lb (67.1 kg)   BMI 25.40 kg/m  , BMI Body mass index is 25.4 kg/m. GEN: Well nourished, well developed, in no acute distress  HEENT: normal  Neck: no JVD, carotid bruits, or masses Cardiac: RRR; no murmurs, rubs, or gallops,no edema  Respiratory:  clear to auscultation bilaterally, normal work of breathing GI: soft, nontender, nondistended, + BS MS: no deformity or atrophy  Skin: warm and dry, device site well healed Neuro:  Strength and sensation are  intact Psych: euthymic mood, full affect  EKG:  EKG is ordered today. Personal review of the ekg ordered shows SR, 1 degree AV block, rate 64, septal infarct (old)  Personal review of the device interrogation today. Results in [place]: 09/22/2017: B Natriuretic Peptide 293.9; Hemoglobin 13.1; Platelets 273; TSH 9.743 10/25/2017: ALT 11; BUN 17; Creat 0.89; Potassium 4.7; Sodium 142    Lipid Panel     Component Value Date/Time   CHOL 214 (H) 01/18/2017 0833   TRIG 81 01/18/2017 0833   HDL 109 01/18/2017 0833   CHOLHDL 2.0 01/18/2017 0833   VLDL 16 01/18/2017 0833   LDLCALC 89 01/18/2017 0833     Wt Readings from Last 3 Encounters:  12/06/17 148 lb (67.1 kg)  10/04/17 147 lb (66.7 kg)  09/25/17 143 lb 9.6 oz (65.1 kg)      Other studies Reviewed: Additional studies/ records that were reviewed today include: TTE 10/26/16  Review of the above records today demonstrates:  - Left ventricle: The cavity size was normal. Wall thickness was   increased  in a pattern of mild LVH. Systolic function was normal.   The estimated ejection fraction was in the range of 55% to 60%.   Wall motion was normal; there were no regional wall motion   abnormalities. Features are consistent with a pseudonormal left   ventricular filling pattern, with concomitant abnormal relaxation   and increased filling pressure (grade 2 diastolic dysfunction). - Aortic valve: Trileaflet; moderately calcified leaflets.   Sclerosis without stenosis. - Mitral valve: Moderately calcified annulus. Mildly calcified   leaflets . There was trivial regurgitation. - Left atrium: The atrium was moderately dilated. - Right ventricle: The cavity size was mildly dilated. Pacer wire   or catheter noted in right ventricle. Systolic function was   normal. - Right atrium: The atrium was moderately dilated. - Tricuspid valve: Peak RV-RA gradient (S): 26 mm Hg. - Pulmonary arteries: PA peak pressure: 29 mm Hg  (S). - Inferior vena cava: The vessel was normal in size. The   respirophasic diameter changes were in the normal range (>= 50%),   consistent with normal central venous pressure.   ASSESSMENT AND PLAN:  1.  Atrial fibrillation/flutter: Status post atrial fibrillation ablation 08/10/17.  Required cardioversion on 09/24/17..  We Tyja Gortney continue amiodarone for another 3 months and stop it at that time.  This patients CHA2DS2-VASc Score and unadjusted Ischemic Stroke Rate (% per year) is equal to 4.8 % stroke rate/year from a score of 4  Above score calculated as 1 point each if present [CHF, HTN, DM, Vascular=MI/PAD/Aortic Plaque, Age if 65-74, or Female] Above score calculated as 2 points each if present [Age > 75, or Stroke/TIA/TE]   2.  Tachybradycardia syndrome: Status post [employer] dual-chamber pacemaker implanted 02/10/14.  Leads stable. No changes at this time.  3. Hypertension: Pressure well controlled today.  No changes.  4. Diastolic heart failure: Volume status stable.  No changes.  5. Coronary artery disease: No current chest pain.  No changes necessary at this time.  Current medicines are reviewed at length with the patient today.   The patient does not have concerns regarding her medicines.  The following changes were made today: Stop amiodarone  Labs/ tests ordered today include:  Orders Placed This Encounter  Procedures  . EKG 12-Lead     Disposition:   FU with Valery Amedee 6 months  Signed, Melonee Gerstel Meredith Leeds, MD  12/06/2017 3:59 PM     [address] [place] [address] [phone] (office) [phone] (fax)

## 2017-12-06 NOTE — Patient Instructions (Addendum)
Medication Instructions:  Your physician has recommended you make the following change in your medication: 1. STOP Amiodarone  * If you need a refill on your cardiac medications before your next appointment, please call your pharmacy. *  Labwork: None ordered  Testing/Procedures: None ordered  Follow-Up: Your physician recommends that you schedule a follow-up appointment in: 3 months with Dr. Curt Bears in [facility]  Thank you for choosing [facility]!!   Trinidad Curet, RN [phone]

## 2017-12-18 DIAGNOSIS — D485 Neoplasm of uncertain behavior of skin: Secondary | ICD-10-CM | POA: Diagnosis not present

## 2017-12-18 DIAGNOSIS — L720 Epidermal cyst: Secondary | ICD-10-CM | POA: Diagnosis not present

## 2017-12-24 ENCOUNTER — Encounter: Payer: Medicare Other | Admitting: Cardiology

## 2017-12-31 ENCOUNTER — Ambulatory Visit (INDEPENDENT_AMBULATORY_CARE_PROVIDER_SITE_OTHER): Payer: Medicare HMO

## 2017-12-31 ENCOUNTER — Encounter: Payer: Self-pay | Admitting: Podiatry

## 2017-12-31 ENCOUNTER — Ambulatory Visit: Payer: Medicare HMO | Admitting: Podiatry

## 2017-12-31 ENCOUNTER — Ambulatory Visit: Payer: Medicare HMO

## 2017-12-31 DIAGNOSIS — M2042 Other hammer toe(s) (acquired), left foot: Secondary | ICD-10-CM

## 2017-12-31 DIAGNOSIS — E0843 Diabetes mellitus due to underlying condition with diabetic autonomic (poly)neuropathy: Secondary | ICD-10-CM | POA: Diagnosis not present

## 2017-12-31 DIAGNOSIS — M2041 Other hammer toe(s) (acquired), right foot: Secondary | ICD-10-CM | POA: Diagnosis not present

## 2017-12-31 MED ORDER — GABAPENTIN 100 MG PO CAPS: 100.0000 mg | ORAL_CAPSULE | Freq: Three times a day (TID) | ORAL | 3 refills | Status: DC

## 2018-01-01 DIAGNOSIS — L723 Sebaceous cyst: Secondary | ICD-10-CM | POA: Diagnosis not present

## 2018-01-02 NOTE — Progress Notes (Signed)
   HPI: 77 year old female presenting today with a chief complaint of stinging, pinching pain to digits 2-5 of the right foot that began 2 weeks ago. She reports associated pain to the dorsum of the right foot and the fourth toe of the left foot. She has not done anything to treat the symptoms. There are no modifying factors noted. Patient is here for further evaluation and treatment.   Past Medical History:  Diagnosis Date  . Atrial fibrillation ([place]) 10/17/2016   a. On Metoprolol, Eliquis, amio;  b. 07/2017 s/p RFCA.  [name] Benign paroxysmal positional vertigo 10/18/2016  . CAD (coronary atherosclerotic disease) 10/17/2016   Hx MI  . Cystitis 10/18/2016  . Dysuria 10/18/2016  . History of anemia 10/18/2016  . History of gastric bypass 10/17/2016  . Hyperlipidemia 10/17/2016  . Lower extremity edema 10/18/2016  . Restless leg syndrome 10/17/2016  . Sleep disorder 10/17/2016   Modafinil for sleep apnea and narcolepsy  . Tobacco dependence 10/17/2016  . Urinary frequency 10/18/2016  . Vitamin D deficiency 10/18/2016     Physical Exam: General: The patient is alert and oriented x3 in no acute distress.  Dermatology: Skin is warm, dry and supple bilateral lower extremities. Negative for open lesions or macerations.  Vascular: Palpable pedal pulses bilaterally. No edema or erythema noted. Capillary refill within normal limits.  Neurological: Epicritic and protective threshold diminished bilaterally.  Patient continues to experience burning with tingling and numbness sensations localized to the right foot  Musculoskeletal Exam: Range of motion within normal limits to all pedal and ankle joints bilateral. Muscle strength 5/5 in all groups bilateral.   Radiographic Exam:  Normal osseous mineralization. Joint spaces preserved. No fracture/dislocation/boney destruction.  Orthopedic hardware intact with the foot in good alignment  Assessment: - Right foot neuropathy  - DM  controlled -History of right forefoot reconstructive surgery   Plan of Care:  - Patient evaluated. X-Rays reviewed.  - Prescription for gabapentin 100 mg three times daily provided to patient. - Continue wearing good sneakers.  - Return to clinic as needed.    Edrick Kins, DPM [facility]  Dr. Edrick Kins, DPM    [address],  [address]                Office [phone]  Fax [phone]

## 2018-01-04 DIAGNOSIS — E119 Type 2 diabetes mellitus without complications: Secondary | ICD-10-CM | POA: Diagnosis not present

## 2018-01-04 DIAGNOSIS — Z881 Allergy status to other antibiotic agents status: Secondary | ICD-10-CM | POA: Diagnosis not present

## 2018-01-04 DIAGNOSIS — I4891 Unspecified atrial fibrillation: Secondary | ICD-10-CM | POA: Diagnosis not present

## 2018-01-04 DIAGNOSIS — I10 Essential (primary) hypertension: Secondary | ICD-10-CM | POA: Diagnosis not present

## 2018-01-04 DIAGNOSIS — Z79899 Other long term (current) drug therapy: Secondary | ICD-10-CM | POA: Diagnosis not present

## 2018-01-04 DIAGNOSIS — R0602 Shortness of breath: Secondary | ICD-10-CM | POA: Diagnosis not present

## 2018-01-04 DIAGNOSIS — R5383 Other fatigue: Secondary | ICD-10-CM | POA: Diagnosis not present

## 2018-01-04 DIAGNOSIS — R42 Dizziness and giddiness: Secondary | ICD-10-CM | POA: Diagnosis not present

## 2018-01-04 DIAGNOSIS — R9431 Abnormal electrocardiogram [ECG] [EKG]: Secondary | ICD-10-CM | POA: Diagnosis not present

## 2018-01-04 DIAGNOSIS — R079 Chest pain, unspecified: Secondary | ICD-10-CM | POA: Diagnosis not present

## 2018-01-04 DIAGNOSIS — Z87891 Personal history of nicotine dependence: Secondary | ICD-10-CM | POA: Diagnosis not present

## 2018-01-04 DIAGNOSIS — R002 Palpitations: Secondary | ICD-10-CM | POA: Diagnosis not present

## 2018-01-04 DIAGNOSIS — E785 Hyperlipidemia, unspecified: Secondary | ICD-10-CM | POA: Diagnosis not present

## 2018-01-07 ENCOUNTER — Telehealth: Payer: Self-pay

## 2018-01-07 NOTE — Telephone Encounter (Signed)
SENT REFERRAL TO SCHEDULING 

## 2018-01-08 ENCOUNTER — Encounter: Payer: Self-pay | Admitting: Osteopathic Medicine

## 2018-01-08 ENCOUNTER — Ambulatory Visit (INDEPENDENT_AMBULATORY_CARE_PROVIDER_SITE_OTHER): Payer: Medicare HMO | Admitting: Osteopathic Medicine

## 2018-01-08 ENCOUNTER — Inpatient Hospital Stay: Payer: Medicare HMO | Admitting: Sports Medicine

## 2018-01-08 VITALS — BP 116/65 | HR 66 | Temp 98.1°F | Wt 154.0 lb

## 2018-01-08 DIAGNOSIS — R42 Dizziness and giddiness: Secondary | ICD-10-CM | POA: Diagnosis not present

## 2018-01-08 NOTE — Progress Notes (Signed)
HPI: Donna [name] is a 77 y.o. female who  has a past medical history of Atrial fibrillation ([place]) (10/17/2016), Benign paroxysmal positional vertigo (10/18/2016), CAD (coronary atherosclerotic disease) (10/17/2016), Cystitis (10/18/2016), Dysuria (10/18/2016), History of anemia (10/18/2016), History of gastric bypass (10/17/2016), Hyperlipidemia (10/17/2016), Lower extremity edema (10/18/2016), Restless leg syndrome (10/17/2016), Sleep disorder (10/17/2016), Tobacco dependence (10/17/2016), Urinary frequency (10/18/2016), and Vitamin D deficiency (10/18/2016).  she presents to [facility], 01/08/18,  for chief complaint of: ER follow-up  ER visit 01/04/18 (4 days ago) for chest pain, concern for Afib or flu. Tropes negative. CXR ok. EKG sinus w/ 1st deg AV block and c/w recent cardio visit, per ER notes. Flu neg. Likely viral syndrome. Stopped provigil.  Per patient, she has some concerns about feeling woozy, dizzy, lightheaded as opposed to real chest pain that she did experience some mild chest pressure which has since resolved. She states this is an ongoing problem for her but was a little bit worse before going to the emergency room. She finds the symptoms difficult to characterize, has a history of vertigo so occasionally will feel a spinning sensation with quick head movements, will also occasionally feel lightheaded especially with postural changes such as going from seated position to standing. No vision blackouts, no headache, no other vision change, no falls or loss of consciousness.  She had also started gabapentin prior to these symptoms, has stopped since then and symptoms have mostly resolved/back to baseline.     Past medical, surgical, social and family history reviewed:  Patient Active Problem List   Diagnosis Date Noted  . Atrial fibrillation with rapid ventricular response ([place]) 09/21/2017  . Status post ablation of atrial  fibrillation 08/15/2017  . AF (atrial fibrillation) ([place]) 08/10/2017  . Atrial flutter ([place])   . Osteoporosis 03/20/2017  . Ischemic changes on head CT 03/01/2017  . Pacemaker 03/01/2017  . Prediabetes 03/01/2017  . Recurrent UTI 01/29/2017  . Mucinous cyst of left thumb 12/18/2016  . Chronic idiopathic constipation 12/08/2016  . Chronic diastolic congestive heart failure ([place]) 11/08/2016  . Benign paroxysmal positional vertigo 10/18/2016  . Lower extremity edema 10/18/2016  . Vitamin D deficiency 10/18/2016  . History of anemia 10/18/2016  . Restless leg syndrome 10/17/2016  . CAD (coronary atherosclerotic disease) 10/17/2016  . Paroxysmal atrial fibrillation ([place]) 10/17/2016  . History of gastric bypass 10/17/2016  . Hyperlipidemia 10/17/2016  . Sleep disorder 10/17/2016  . Tobacco dependence 10/17/2016    Past Surgical History:  Procedure Laterality Date  . ATRIAL FIBRILLATION ABLATION N/A 08/10/2017   Procedure: Atrial Fibrillation Ablation;  Surgeon: [name], MD;  Location: [place] [facility] LAB;  Service: Cardiovascular;  Laterality: N/A;  . CARDIAC CATHETERIZATION    . CARDIOVERSION N/A 04/27/2017   Procedure: CARDIOVERSION;  Surgeon: [name], MD;  Location[facility] ENDOSCOPY;  Service: Cardiovascular;  Laterality: N/A;  . CARDIOVERSION N/A 05/24/2017   Procedure: CARDIOVERSION;  Surgeon: [name], MD;  Location: [facility] ENDOSCOPY;  Service: Cardiovascular;  Laterality: N/A;  . CARDIOVERSION N/A 09/24/2017   Procedure: CARDIOVERSION;  Surgeon: [name], MD;  Location: [place] [facility] LAB;  Service: Cardiovascular;  Laterality: N/A;    Social History   Tobacco Use  . Smoking status: Former Smoker    Packs/day: 1.00    Years: 55.00    Pack years: 55.00    Last attempt to quit: 11/21/2015    Years since quitting: 2.1  . Smokeless tobacco: Never Used  Substance Use Topics  . Alcohol use: No    Family History  Problem Relation Age of Onset  .  Emphysema Mother   . Heart disease Father   . Diabetes Father   . Emphysema Father   . Hypertension Sister   . Bladder Cancer Brother   . Hypertension Brother   . Heart attack Maternal Grandmother   . Kidney failure Paternal Grandmother   . Stroke Paternal Grandfather   . Hypertension Brother      Current medication list and allergy/intolerance information reviewed:    Current Outpatient Medications  Medication Sig Dispense Refill  . atorvastatin (LIPITOR) 10 MG tablet Take 1 tablet (10 mg total) by mouth at bedtime. 90 tablet 3  . Cholecalciferol (VITAMIN D3) 5000 units CAPS Take 5,000 Units by mouth daily.     [name] co-enzyme Q-10 30 MG capsule Take 30 mg by mouth 2 (two) times daily.     [name] ELIQUIS 5 MG TABS tablet Take 1 tablet (5 mg total) by mouth 2 (two) times daily. 180 tablet 3  . Ferrous Sulfate (IRON) 325 (65 Fe) MG TABS Take 325 mg by mouth daily with breakfast.     . folic acid (FOLVITE) 585 MCG tablet Take 800 mcg by mouth daily.    . furosemide (LASIX) 20 MG tablet Take 1 tablet (20 mg total) by mouth as directed. Take as directed (Patient taking differently: Take 20 mg by mouth as needed for fluid. Take as directed) 30 tablet 9  . loperamide (IMODIUM) 2 MG capsule Take 2 mg by mouth See admin instructions. After AM bowel movement (as needed for leakage)    . meclizine (ANTIVERT) 12.5 MG tablet TAKE ONE TABLET BY MOUTH THREE TIMES DAILY AS NEEDED FOR DIZZINESS (Patient taking differently: TAKE ONE TABLET BY MOUTH TWICE A DAY, every day.) 90 tablet 3  . metoprolol tartrate (LOPRESSOR) 25 MG tablet 1 tablet 2 (two) times daily.    . ondansetron (ZOFRAN-ODT) 8 MG disintegrating tablet Take 1 tablet (8 mg total) by mouth every 8 (eight) hours as needed for nausea. 20 tablet 3  . potassium chloride (K-DUR) 10 MEQ tablet Take 1 tablet (10 mEq total) by mouth daily as needed. When you take Lasix 15 tablet 1  . pramipexole (MIRAPEX) 0.25 MG tablet TAKE ONE TABLET BY MOUTH TWICE DAILY  60 tablet 11  . gabapentin (NEURONTIN) 100 MG capsule Take 1 capsule (100 mg total) by mouth 3 (three) times daily. (Patient not taking: Reported on 01/08/2018) 90 capsule 3   No current facility-administered medications for this visit.     Allergies  Allergen Reactions  . Penicillins Hives    Has patient had a PCN reaction causing immediate rash, facial/tongue/throat swelling, SOB or lightheadedness with hypotension: Yes Has patient had a PCN reaction causing severe rash involving mucus membranes or skin necrosis: No Has patient had a PCN reaction that required hospitalization: No Has patient had a PCN reaction occurring within the last 10 years: No If all of the above answers are "NO", then may proceed with Cephalosporin use.   . Vancomycin Anaphylaxis  . Tape Rash      Review of Systems:  Constitutional:  No  fever, no chills, No recent illness, No unintentional weight changes. +chronic fatigue.   HEENT: No  headache, no vision change, no hearing change, No sore throat, No  sinus pressure  Cardiac: No  chest pain, No  pressure, No palpitations, No  Orthopnea  Respiratory:  No  shortness of  breath. No  Cough  Gastrointestinal: No  abdominal pain, No  nausea, No  vomiting  Musculoskeletal: No new myalgia/arthralgia  Skin: No  Rash  Neurologic: No  weakness, +dizziness, No  slurred speech/focal weakness/facial droop  Psychiatric: No  concerns with depression, No  concerns with anxiety  Exam:  BP 116/65   Pulse 66   Temp 98.1 F (36.7 C) (Oral)   Wt 154 lb 0.6 oz (69.9 kg)   BMI 26.44 kg/m   Orthostatic VS for the past 24 hrs:  BP- Lying Pulse- Lying BP- Sitting Pulse- Sitting BP- Standing at 0 minutes Pulse- Standing at 0 minutes  01/08/18 1135 126/74 69 126/78 65 129/79 67    Constitutional: VS see above. General Appearance: alert, well-developed, well-nourished, NAD  Eyes: Normal lids and conjunctive, non-icteric sclera  Ears, Nose, Mouth, Throat: MMM, Normal  external inspection ears/nares/mouth/lips/gums. TM normal bilaterally  Neck: No masses, trachea midline. No thyroid enlargement. No tenderness/mass appreciated. No lymphadenopathy  Respiratory: Normal respiratory effort. no wheeze, no rhonchi, no rales  Cardiovascular: S1/S2 normal, no murmur, no rub/gallop auscultated. RRR. No lower extremity edema.   Musculoskeletal: Gait normal. No clubbing/cyanosis of digits.   Neurological: Normal balance/coordination. No tremor. No cranial nerve deficit on limited exam. Motor and sensation intact and symmetric. Cerebellar reflexes intact.   Skin: warm, dry, intact. No rash/ulcer. No concerning nevi or subq nodules on limited exam.    Psychiatric: Normal judgment/insight. Normal mood and affect. Oriented x3.      ASSESSMENT/PLAN: Dizziness/lightheadedness difficult to characterize. Orthostatic vital signs negative in the office that she may certainly have some component of orthostatic hypotension combined with BPPV. Stable for now, offered workup with repeat echocardiogram/cardiology referral or vestibular rehabilitation/neurology referral which she declines at this point. Watchful waiting, ER just did labs and EKG which I don't feel compelled to repeat at this point unless something changes/worsens.     Patient Instructions  I think we may be looking at a Gabapentin side effect or possible mild viral infection exacerbating your vertigo. What might help is reducing your Metoprolol dose a bit as this can also cause lightheadedness, but I'd do this with caution since reducing the dose can also cause heart rate to go up. You can discuss further with your cardiologist if you'd like before making a decision.     Visit summary with medication list and pertinent instructions was printed for patient to review. All questions at time of visit were answered - patient instructed to contact office with any additional concerns. ER/RTC precautions were reviewed  with the patient.   Follow-up plan: Return in about 3 months (around 04/07/2018) for recheck dizziness and BP .  Note: Total time spent 25 minutes, greater than 50% of the visit was spent face-to-face counseling and coordinating care for the following: The primary encounter diagnosis was Dizziness. A diagnosis of Lightheaded was also pertinent to this visit.[name]  Please note: voice recognition software was used to produce this document, and typos may escape review. Please contact Dr. Sheppard Coil for any needed clarifications.

## 2018-01-08 NOTE — Patient Instructions (Addendum)
I think we may be looking at a Gabapentin side effect or possible mild viral infection exacerbating your vertigo. What might help is reducing your Metoprolol dose a bit as this can also cause lightheadedness, but I'd do this with caution since reducing the dose can also cause heart rate to go up. You can discuss further with your cardiologist if you'd like before making a decision.

## 2018-01-09 ENCOUNTER — Encounter: Payer: Self-pay | Admitting: Osteopathic Medicine

## 2018-01-16 DIAGNOSIS — M792 Neuralgia and neuritis, unspecified: Secondary | ICD-10-CM | POA: Diagnosis not present

## 2018-01-16 DIAGNOSIS — I110 Hypertensive heart disease with heart failure: Secondary | ICD-10-CM | POA: Diagnosis not present

## 2018-01-16 DIAGNOSIS — I509 Heart failure, unspecified: Secondary | ICD-10-CM | POA: Diagnosis not present

## 2018-01-16 DIAGNOSIS — G4730 Sleep apnea, unspecified: Secondary | ICD-10-CM | POA: Diagnosis not present

## 2018-01-16 DIAGNOSIS — I4891 Unspecified atrial fibrillation: Secondary | ICD-10-CM | POA: Diagnosis not present

## 2018-01-16 DIAGNOSIS — E785 Hyperlipidemia, unspecified: Secondary | ICD-10-CM | POA: Diagnosis not present

## 2018-01-16 DIAGNOSIS — M1990 Unspecified osteoarthritis, unspecified site: Secondary | ICD-10-CM | POA: Diagnosis not present

## 2018-01-16 DIAGNOSIS — K219 Gastro-esophageal reflux disease without esophagitis: Secondary | ICD-10-CM | POA: Diagnosis not present

## 2018-01-16 DIAGNOSIS — G8929 Other chronic pain: Secondary | ICD-10-CM | POA: Diagnosis not present

## 2018-01-16 DIAGNOSIS — G2581 Restless legs syndrome: Secondary | ICD-10-CM | POA: Diagnosis not present

## 2018-01-18 DIAGNOSIS — L905 Scar conditions and fibrosis of skin: Secondary | ICD-10-CM | POA: Diagnosis not present

## 2018-01-18 DIAGNOSIS — L723 Sebaceous cyst: Secondary | ICD-10-CM | POA: Diagnosis not present

## 2018-01-18 DIAGNOSIS — R238 Other skin changes: Secondary | ICD-10-CM | POA: Diagnosis not present

## 2018-01-22 ENCOUNTER — Ambulatory Visit (INDEPENDENT_AMBULATORY_CARE_PROVIDER_SITE_OTHER): Payer: Medicare HMO | Admitting: *Deleted

## 2018-01-22 DIAGNOSIS — I495 Sick sinus syndrome: Secondary | ICD-10-CM

## 2018-01-22 NOTE — Progress Notes (Signed)
Remote pacemaker transmission.   

## 2018-01-23 ENCOUNTER — Encounter: Payer: Self-pay | Admitting: Cardiology

## 2018-01-24 LAB — CUP PACEART REMOTE DEVICE CHECK
Battery Remaining Percentage: 100 %
Brady Statistic RA Percent Paced: 41 %
Brady Statistic RV Percent Paced: 2 %
Implantable Lead Implant Date: 20150324
Implantable Lead Location: 753860
Implantable Lead Model: 4456
Implantable Lead Model: 4479
Implantable Lead Serial Number: 531114
Implantable Pulse Generator Implant Date: 20150324
Lead Channel Impedance Value: 501 Ohm
Lead Channel Pacing Threshold Pulse Width: 0.4 ms
Lead Channel Pacing Threshold Pulse Width: 0.5 ms
Lead Channel Setting Pacing Amplitude: 2 V
Lead Channel Setting Pacing Pulse Width: 0.4 ms
Lead Channel Setting Sensing Sensitivity: 2.5 mV
MDC IDC LEAD IMPLANT DT: 20150324
MDC IDC LEAD LOCATION: 753859
MDC IDC LEAD SERIAL: 479154
MDC IDC MSMT BATTERY REMAINING LONGEVITY: 66 mo
MDC IDC MSMT LEADCHNL RA PACING THRESHOLD AMPLITUDE: 0.5 V
MDC IDC MSMT LEADCHNL RV IMPEDANCE VALUE: 561 Ohm
MDC IDC MSMT LEADCHNL RV PACING THRESHOLD AMPLITUDE: 0.9 V
MDC IDC SESS DTM: 20190305075100
MDC IDC SET LEADCHNL RA PACING AMPLITUDE: 2 V
Pulse Gen Serial Number: 391090

## 2018-01-29 ENCOUNTER — Other Ambulatory Visit: Payer: Self-pay | Admitting: Osteopathic Medicine

## 2018-01-29 DIAGNOSIS — I4891 Unspecified atrial fibrillation: Secondary | ICD-10-CM

## 2018-02-18 ENCOUNTER — Encounter: Payer: Self-pay | Admitting: Family Medicine

## 2018-02-18 ENCOUNTER — Ambulatory Visit (INDEPENDENT_AMBULATORY_CARE_PROVIDER_SITE_OTHER): Payer: Medicare HMO | Admitting: Family Medicine

## 2018-02-18 ENCOUNTER — Other Ambulatory Visit: Payer: Self-pay | Admitting: Osteopathic Medicine

## 2018-02-18 ENCOUNTER — Ambulatory Visit (INDEPENDENT_AMBULATORY_CARE_PROVIDER_SITE_OTHER): Payer: Medicare HMO

## 2018-02-18 VITALS — BP 103/69 | HR 72 | Temp 98.5°F | Wt 160.0 lb

## 2018-02-18 DIAGNOSIS — J90 Pleural effusion, not elsewhere classified: Secondary | ICD-10-CM

## 2018-02-18 DIAGNOSIS — R0602 Shortness of breath: Secondary | ICD-10-CM | POA: Diagnosis not present

## 2018-02-18 DIAGNOSIS — I5032 Chronic diastolic (congestive) heart failure: Secondary | ICD-10-CM

## 2018-02-18 DIAGNOSIS — R05 Cough: Secondary | ICD-10-CM | POA: Diagnosis not present

## 2018-02-18 DIAGNOSIS — R7303 Prediabetes: Secondary | ICD-10-CM | POA: Diagnosis not present

## 2018-02-18 DIAGNOSIS — R059 Cough, unspecified: Secondary | ICD-10-CM

## 2018-02-18 DIAGNOSIS — I480 Paroxysmal atrial fibrillation: Secondary | ICD-10-CM

## 2018-02-18 DIAGNOSIS — R600 Localized edema: Secondary | ICD-10-CM

## 2018-02-18 DIAGNOSIS — H8110 Benign paroxysmal vertigo, unspecified ear: Secondary | ICD-10-CM

## 2018-02-18 MED ORDER — LEVOFLOXACIN 500 MG PO TABS: 500.0000 mg | ORAL_TABLET | Freq: Every day | ORAL | 0 refills | Status: DC

## 2018-02-18 NOTE — Progress Notes (Signed)
Donna [name] is a 77 y.o. female who presents to [place]: [place] today for cough congestion shortness of breath with exertion and fever.  About 10 days ago Donna [name] developed cough and started feeling ill.  She notes her symptoms worsened and she is become more short of breath especially with exertion and developed a fever today.  When asked she notes leg swelling and some weight gain as well.  She denies orthopnea but sleeps propped up for acid reflux.  She denies chest pain or palpitations.  She notes that she has not been using her Lasix.    Past Medical History:  Diagnosis Date  . Atrial fibrillation ([place]) 10/17/2016   a. On Metoprolol, Eliquis, amio;  b. 07/2017 s/p RFCA.  [name] Benign paroxysmal positional vertigo 10/18/2016  . CAD (coronary atherosclerotic disease) 10/17/2016   Hx MI  . Cystitis 10/18/2016  . Dysuria 10/18/2016  . History of anemia 10/18/2016  . History of gastric bypass 10/17/2016  . Hyperlipidemia 10/17/2016  . Lower extremity edema 10/18/2016  . Restless leg syndrome 10/17/2016  . Sleep disorder 10/17/2016   Modafinil for sleep apnea and narcolepsy  . Tobacco dependence 10/17/2016  . Urinary frequency 10/18/2016  . Vitamin D deficiency 10/18/2016   Past Surgical History:  Procedure Laterality Date  . ATRIAL FIBRILLATION ABLATION N/A 08/10/2017   Procedure: Atrial Fibrillation Ablation;  Surgeon: [name], MD;  Location: [place] [facility];  Service: Cardiovascular;  Laterality: N/A;  . CARDIAC CATHETERIZATION    . CARDIOVERSION N/A 04/27/2017   Procedure: CARDIOVERSION;  Surgeon: [name], MD;  Location: [facility] ENDOSCOPY;  Service: Cardiovascular;  Laterality: N/A;  . CARDIOVERSION N/A 05/24/2017   Procedure: CARDIOVERSION;  Surgeon: [name], MD;  Location: [facility] ENDOSCOPY;  Service: Cardiovascular;  Laterality: N/A;  .  CARDIOVERSION N/A 09/24/2017   Procedure: CARDIOVERSION;  Surgeon: [name], MD;  Location: [place] [facility];  Service: Cardiovascular;  Laterality: N/A;   Social History   Tobacco Use  . Smoking status: Former Smoker    Packs/day: 1.00    Years: 55.00    Pack years: 55.00    Last attempt to quit: 11/21/2015    Years since quitting: 2.2  . Smokeless tobacco: Never Used  Substance Use Topics  . Alcohol use: No   family history includes Bladder Cancer in her brother; Diabetes in her father; Emphysema in her father and mother; Heart attack in her maternal grandmother; Heart disease in her father; Hypertension in her brother, brother, and sister; Kidney failure in her paternal grandmother; Stroke in her paternal grandfather.  ROS as above:  Medications: Current Outpatient Medications  Medication Sig Dispense Refill  . atorvastatin (LIPITOR) 10 MG tablet Take 1 tablet (10 mg total) by mouth at bedtime. 90 tablet 3  . Cholecalciferol (VITAMIN D3) 5000 units CAPS Take 5,000 Units by mouth daily.     [name] co-enzyme Q-10 30 MG capsule Take 30 mg by mouth 2 (two) times daily.     [name] ELIQUIS 5 MG TABS tablet TAKE 1 TABLET BY MOUTH TWICE DAILY 60 tablet 11  . Ferrous Sulfate (IRON) 325 (65 Fe) MG TABS Take 325 mg by mouth daily with breakfast.     . folic acid (FOLVITE) 962 MCG tablet Take 800 mcg by mouth daily.    . furosemide (LASIX) 20 MG tablet Take 1 tablet (20 mg total) by mouth as directed. Take as directed (Patient taking  differently: Take 20 mg by mouth as needed for fluid. Take as directed) 30 tablet 9  . loperamide (IMODIUM) 2 MG capsule Take 2 mg by mouth See admin instructions. After AM bowel movement (as needed for leakage)    . meclizine (ANTIVERT) 12.5 MG tablet TAKE ONE TABLET BY MOUTH THREE TIMES DAILY AS NEEDED FOR DIZZINESS (Patient taking differently: TAKE ONE TABLET BY MOUTH TWICE A DAY, every day.) 90 tablet 3  . metoprolol tartrate (LOPRESSOR) 25 MG tablet 1 tablet  2 (two) times daily.    . ondansetron (ZOFRAN-ODT) 8 MG disintegrating tablet Take 1 tablet (8 mg total) by mouth every 8 (eight) hours as needed for nausea. 20 tablet 3  . potassium chloride (K-DUR) 10 MEQ tablet Take 1 tablet (10 mEq total) by mouth daily as needed. When you take Lasix 15 tablet 1  . pramipexole (MIRAPEX) 0.25 MG tablet TAKE ONE TABLET BY MOUTH TWICE DAILY 60 tablet 11  . levofloxacin (LEVAQUIN) 500 MG tablet Take 1 tablet (500 mg total) by mouth daily. 7 tablet 0   No current facility-administered medications for this visit.    Allergies  Allergen Reactions  . Penicillins Hives    Has patient had a PCN reaction causing immediate rash, facial/tongue/throat swelling, SOB or lightheadedness with hypotension: Yes Has patient had a PCN reaction causing severe rash involving mucus membranes or skin necrosis: No Has patient had a PCN reaction that required hospitalization: No Has patient had a PCN reaction occurring within the last 10 years: No If all of the above answers are "NO", then may proceed with Cephalosporin use.   . Vancomycin Anaphylaxis  . Tape Rash    Health Maintenance Health Maintenance  Topic Date Due  . FOOT EXAM  11/18/1951  . OPHTHALMOLOGY EXAM  11/18/1951  . URINE MICROALBUMIN  11/18/1951  . PNA vac Low Risk Adult (1 of 2 - PCV13) 11/18/2008  . HEMOGLOBIN A1C  07/21/2017  . INFLUENZA VACCINE  06/20/2018  . MAMMOGRAM  03/21/2019  . COLONOSCOPY  05/12/2025  . TETANUS/TDAP  01/26/2027  . DEXA SCAN  Completed     Exam:  BP 103/69   Pulse 72   Temp 98.5 F (36.9 C) (Oral)   Wt 160 lb (72.6 kg)   SpO2 96%   BMI 27.46 kg/m   Wt Readings from Last 5 Encounters:  02/18/18 160 lb (72.6 kg)  01/08/18 154 lb 0.6 oz (69.9 kg)  12/06/17 148 lb (67.1 kg)  10/04/17 147 lb (66.7 kg)  09/25/17 143 lb 9.6 oz (65.1 kg)    Gen: Fatigued but nontoxic appearing HEENT: EOMI,  MMM no increased JVD Lungs: Normal work of breathing. CTABL.  Decreased  breath sounds lower lung fields. Heart: RRR no MRG Abd: NABS, Soft. Nondistended, Nontender 1+ edema bilateral lower extremities Exts: Brisk capillary refill, warm and well perfused.    No results found for this or any previous visit (from the past 72 hour(s)). Dg Chest 2 View  Result Date: 02/18/2018 CLINICAL DATA:  Ten day history of cough, fever and fatigue. EXAM: CHEST - 2 VIEW COMPARISON:  09/21/2017 FINDINGS: Enlarged cardiac silhouette that could be due to cardiomegaly and/or pericardial fluid. Pacemaker leads appear the same. Venous hypertension without frank edema. Small effusions with dependent atelectasis, left more than right. Chronic degenerative changes affect the spine. IMPRESSION: Interval enlargement of the cardiac silhouette which could be due to cardiomegaly and/or pericardial fluid. Venous hypertension without frank edema. Small effusions with dependent atelectasis, left more than right.  Findings consistent with congestive heart failure. Electronically Signed   By: Nelson Chimes M.D.   On: 02/18/2018 15:32  I personally (independently) visualized and performed the interpretation of the images attached in this note.     Assessment and Plan: 77 y.o. female with shortness of breath cough and fever.  I think some of these issues are separate but likely interrelated.  Donna [name] seems to have signs and symptoms suggestive of heart failure exacerbation.  She is gained weight and has edema on her lower extremities as well as pleural effusion and other chest x-ray signs suggestive of fluid overload.  Plan for metabolic panel BNP and CBC.  Plan for restart Lasix and recheck in 2 days.  Return sooner if worsening.  Patient additionally has cough and fever which should not occur with heart failure.  I am concerned that she has a pneumonia hiding in the pleural effusion and not really visible on chest x-ray.  Plan for lab workup as noted above and empiric treatment with Levaquin given penicillin  allergy.  Also recheck A1c based on health maintenance needs as we are drawing labs today.  Recheck in 2 days return sooner if needed.   Orders Placed This Encounter  Procedures  . DG Chest 2 View    Order Specific Question:   Reason for exam:    Answer:   Cough, assess intra-thoracic pathology hx heart failure ? fluid vs pna    Order Specific Question:   Preferred imaging location?    Answer:   [facility]  . CBC with Differential/Platelet  . COMPLETE METABOLIC PANEL WITH GFR  . Hemoglobin A1c  . B Nat Peptide   Meds ordered this encounter  Medications  . levofloxacin (LEVAQUIN) 500 MG tablet    Sig: Take 1 tablet (500 mg total) by mouth daily.    Dispense:  7 tablet    Refill:  0     Discussed warning signs or symptoms. Please see discharge instructions. Patient expresses understanding.

## 2018-02-18 NOTE — Patient Instructions (Addendum)
Thank you for coming in today. Start Levaquin daily.  Take lasix daily until recheck.  Weigh yourself daily.  Recheck in 2 days or so.  Return sooner if worse.   Call or go to the emergency room if you get worse, have trouble breathing, have chest pains, or palpitations.    Pleural Effusion A pleural effusion is an abnormal buildup of fluid in the layers of tissue between your lungs and the inside of your chest (pleural space). These two layers of tissue that line both your lungs and the inside of your chest are called pleura. Usually, there is no air in the space between the pleura, only a thin layer of fluid. If left untreated, a large amount of fluid can build up and cause the lung to collapse. A pleural effusion is usually caused by another disease that requires treatment. The two main types of pleural effusion are:  Transudative pleural effusion. This happens when fluid leaks into the pleural space because of a low protein count in your blood or high blood pressure in your vessels. Heart failure often causes this.  Exudative infusion. This occurs when fluid collects in the pleural space from blocked blood vessels or lymph vessels. Some lung diseases, injuries, and cancers can cause this type of effusion.  What are the causes? Pleural effusion can be caused by:  Heart failure.  A blood clot in the lung (pulmonary embolism).  Pneumonia.  Cancer.  Liver failure (cirrhosis).  Kidney disease.  Complications from surgery, such as from open heart surgery.  What are the signs or symptoms? In some cases, pleural effusion may cause no symptoms. Symptoms can include:  Shortness of breath, especially when lying down.  Chest pain, often worse when taking a deep breath.  Fever.  Dry cough that is lasting (chronic).  Hiccups.  Rapid breathing.  An underlying condition that is causing the pleural effusion (such as heart failure, pneumonia, blood clots, tuberculosis, or cancer)  may also cause additional symptoms. How is this diagnosed? Your health care provider may suspect pleural effusion based on your symptoms and medical history. Your health care provider will also do a physical exam and a chest X-ray. If the X-ray shows there is fluid in your chest, you may need to have this fluid removed using a needle (thoracentesis) so it can be tested. You may also have:  Imaging studies of the chest, such as: ? Ultrasound. ? CT scan.  Blood tests for kidney and liver function.  How is this treated? Treatment depends on the cause of the pleural effusion. Treatment may include:  Taking antibiotic medicines to clear up an infection that is causing the pleural effusion.  Placing a tube in the chest to drain the effusion (tube thoracostomy). This procedure is often used when there is an infection in the fluid.  Surgery to remove the fibrous outer layer of tissue from the pleural space (decortication).  Thoracentesis, which can improve cough and shortness of breath.  A procedure to put medicine into the chest cavity to seal the pleural space to prevent fluid buildup (pleurodesis).  Chemotherapy and radiation therapy. These may be required in the case of cancerous (malignant) pleural effusion.  Follow these instructions at home:  Take medicines only as directed by your health care provider.  Keep track of how long you can gently exercise before you get short of breath. Try simply walking at first.  Do not use any tobacco products, including cigarettes, chewing tobacco, or electronic cigarettes. If you  need help quitting, ask your health care provider.  Keep all follow-up visits as directed by your health care provider. This is important. Contact a health care provider if:  The amount of time that you are able to exercise decreases or does not improve with time.  You have pain or signs of infection at the puncture site if you had thoracentesis. Watch  for: ? Drainage. ? Redness. ? Swelling.  You have a fever. Get help right away if:  You are short of breath.  You develop chest pain.  You develop a new cough. This information is not intended to replace advice given to you by your health care provider. Make sure you discuss any questions you have with your health care provider. Document Released: 11/06/2005 Document Revised: 04/10/2016 Document Reviewed: 04/01/2014 Elsevier Interactive Patient Education  [employer].

## 2018-02-19 DIAGNOSIS — I480 Paroxysmal atrial fibrillation: Secondary | ICD-10-CM | POA: Diagnosis not present

## 2018-02-19 DIAGNOSIS — Z9049 Acquired absence of other specified parts of digestive tract: Secondary | ICD-10-CM | POA: Diagnosis not present

## 2018-02-19 DIAGNOSIS — E119 Type 2 diabetes mellitus without complications: Secondary | ICD-10-CM | POA: Diagnosis not present

## 2018-02-19 DIAGNOSIS — J189 Pneumonia, unspecified organism: Secondary | ICD-10-CM | POA: Diagnosis not present

## 2018-02-19 DIAGNOSIS — J9811 Atelectasis: Secondary | ICD-10-CM | POA: Diagnosis not present

## 2018-02-19 DIAGNOSIS — Z7901 Long term (current) use of anticoagulants: Secondary | ICD-10-CM | POA: Diagnosis not present

## 2018-02-19 DIAGNOSIS — Z950 Presence of cardiac pacemaker: Secondary | ICD-10-CM | POA: Diagnosis not present

## 2018-02-19 DIAGNOSIS — R918 Other nonspecific abnormal finding of lung field: Secondary | ICD-10-CM | POA: Diagnosis not present

## 2018-02-19 DIAGNOSIS — R0602 Shortness of breath: Secondary | ICD-10-CM | POA: Diagnosis not present

## 2018-02-19 DIAGNOSIS — Z87891 Personal history of nicotine dependence: Secondary | ICD-10-CM | POA: Diagnosis not present

## 2018-02-19 DIAGNOSIS — I509 Heart failure, unspecified: Secondary | ICD-10-CM | POA: Diagnosis not present

## 2018-02-19 DIAGNOSIS — Z79899 Other long term (current) drug therapy: Secondary | ICD-10-CM | POA: Diagnosis not present

## 2018-02-19 DIAGNOSIS — Z880 Allergy status to penicillin: Secondary | ICD-10-CM | POA: Diagnosis not present

## 2018-02-19 DIAGNOSIS — E7849 Other hyperlipidemia: Secondary | ICD-10-CM | POA: Diagnosis not present

## 2018-02-19 DIAGNOSIS — I313 Pericardial effusion (noninflammatory): Secondary | ICD-10-CM | POA: Diagnosis not present

## 2018-02-19 DIAGNOSIS — Z881 Allergy status to other antibiotic agents status: Secondary | ICD-10-CM | POA: Diagnosis not present

## 2018-02-19 DIAGNOSIS — I517 Cardiomegaly: Secondary | ICD-10-CM | POA: Diagnosis not present

## 2018-02-19 DIAGNOSIS — R05 Cough: Secondary | ICD-10-CM | POA: Diagnosis not present

## 2018-02-19 DIAGNOSIS — J90 Pleural effusion, not elsewhere classified: Secondary | ICD-10-CM | POA: Diagnosis not present

## 2018-02-19 DIAGNOSIS — I10 Essential (primary) hypertension: Secondary | ICD-10-CM | POA: Diagnosis not present

## 2018-02-19 LAB — COMPLETE METABOLIC PANEL WITH GFR
AG RATIO: 1.2 (calc) (ref 1.0–2.5)
ALT: 23 U/L (ref 6–29)
AST: 18 U/L (ref 10–35)
Albumin: 3.3 g/dL — ABNORMAL LOW (ref 3.6–5.1)
Alkaline phosphatase (APISO): 144 U/L — ABNORMAL HIGH (ref 33–130)
BUN: 15 mg/dL (ref 7–25)
CALCIUM: 8.5 mg/dL — AB (ref 8.6–10.4)
CO2: 28 mmol/L (ref 20–32)
Chloride: 103 mmol/L (ref 98–110)
Creat: 0.66 mg/dL (ref 0.60–0.93)
GFR, EST AFRICAN AMERICAN: 101 mL/min/{1.73_m2} (ref >=60)
GFR, EST NON AFRICAN AMERICAN: 87 mL/min/{1.73_m2} (ref >=60)
Globulin: 2.8 g/dL (calc) (ref 1.9–3.7)
Glucose, Bld: 96 mg/dL (ref 65–99)
POTASSIUM: 4.4 mmol/L (ref 3.5–5.3)
Sodium: 139 mmol/L (ref 135–146)
TOTAL PROTEIN: 6.1 g/dL (ref 6.1–8.1)
Total Bilirubin: 0.4 mg/dL (ref 0.2–1.2)

## 2018-02-19 LAB — CBC WITH DIFFERENTIAL/PLATELET
BASOS ABS: 39 {cells}/uL (ref 0–200)
Basophils Relative: 0.3 %
EOS ABS: 66 {cells}/uL (ref 15–500)
Eosinophils Relative: 0.5 %
HEMATOCRIT: 32.5 % — AB (ref 35.0–45.0)
HEMOGLOBIN: 11.2 g/dL — AB (ref 11.7–15.5)
LYMPHS ABS: 1703 {cells}/uL (ref 850–3900)
MCH: 29.1 pg (ref 27.0–33.0)
MCHC: 34.5 g/dL (ref 32.0–36.0)
MCV: 84.4 fL (ref 80.0–100.0)
MPV: 9.9 fL (ref 7.5–12.5)
Monocytes Relative: 9.6 %
NEUTROS PCT: 76.6 %
Neutro Abs: 10035 cells/uL — ABNORMAL HIGH (ref 1500–7800)
Platelets: 428 10*3/uL — ABNORMAL HIGH (ref 140–400)
RBC: 3.85 10*6/uL (ref 3.80–5.10)
RDW: 13.2 % (ref 11.0–15.0)
Total Lymphocyte: 13 %
WBC: 13.1 10*3/uL — ABNORMAL HIGH (ref 3.8–10.8)
WBCMIX: 1258 {cells}/uL — AB (ref 200–950)

## 2018-02-19 LAB — BRAIN NATRIURETIC PEPTIDE: BRAIN NATRIURETIC PEPTIDE: 93 pg/mL (ref <100)

## 2018-02-19 LAB — HEMOGLOBIN A1C
EAG (MMOL/L): 7 (calc)
Hgb A1c MFr Bld: 6 % of total Hgb — ABNORMAL HIGH (ref <5.7)
Mean Plasma Glucose: 126 (calc)

## 2018-02-20 ENCOUNTER — Ambulatory Visit: Payer: Medicare HMO | Admitting: Family Medicine

## 2018-02-20 DIAGNOSIS — I340 Nonrheumatic mitral (valve) insufficiency: Secondary | ICD-10-CM | POA: Diagnosis not present

## 2018-02-20 DIAGNOSIS — I517 Cardiomegaly: Secondary | ICD-10-CM | POA: Diagnosis not present

## 2018-02-20 DIAGNOSIS — I519 Heart disease, unspecified: Secondary | ICD-10-CM | POA: Diagnosis not present

## 2018-02-20 DIAGNOSIS — I10 Essential (primary) hypertension: Secondary | ICD-10-CM | POA: Diagnosis not present

## 2018-02-20 DIAGNOSIS — Z881 Allergy status to other antibiotic agents status: Secondary | ICD-10-CM | POA: Diagnosis not present

## 2018-02-20 DIAGNOSIS — I083 Combined rheumatic disorders of mitral, aortic and tricuspid valves: Secondary | ICD-10-CM | POA: Diagnosis not present

## 2018-02-20 DIAGNOSIS — Z0189 Encounter for other specified special examinations: Secondary | ICD-10-CM

## 2018-02-20 DIAGNOSIS — I480 Paroxysmal atrial fibrillation: Secondary | ICD-10-CM | POA: Diagnosis not present

## 2018-02-20 DIAGNOSIS — E1169 Type 2 diabetes mellitus with other specified complication: Secondary | ICD-10-CM | POA: Diagnosis not present

## 2018-02-20 DIAGNOSIS — E119 Type 2 diabetes mellitus without complications: Secondary | ICD-10-CM | POA: Diagnosis not present

## 2018-02-20 DIAGNOSIS — G47419 Narcolepsy without cataplexy: Secondary | ICD-10-CM | POA: Diagnosis not present

## 2018-02-20 DIAGNOSIS — E7849 Other hyperlipidemia: Secondary | ICD-10-CM | POA: Diagnosis not present

## 2018-02-20 DIAGNOSIS — J189 Pneumonia, unspecified organism: Secondary | ICD-10-CM | POA: Diagnosis not present

## 2018-02-20 DIAGNOSIS — Z950 Presence of cardiac pacemaker: Secondary | ICD-10-CM | POA: Diagnosis not present

## 2018-02-20 DIAGNOSIS — R918 Other nonspecific abnormal finding of lung field: Secondary | ICD-10-CM | POA: Diagnosis not present

## 2018-02-20 DIAGNOSIS — Z880 Allergy status to penicillin: Secondary | ICD-10-CM | POA: Diagnosis not present

## 2018-02-20 DIAGNOSIS — I309 Acute pericarditis, unspecified: Secondary | ICD-10-CM | POA: Diagnosis not present

## 2018-02-20 DIAGNOSIS — J90 Pleural effusion, not elsewhere classified: Secondary | ICD-10-CM | POA: Diagnosis not present

## 2018-02-20 DIAGNOSIS — I313 Pericardial effusion (noninflammatory): Secondary | ICD-10-CM | POA: Diagnosis not present

## 2018-02-20 DIAGNOSIS — I314 Cardiac tamponade: Secondary | ICD-10-CM | POA: Diagnosis not present

## 2018-02-20 NOTE — Progress Notes (Deleted)
Patient hospitalized. No show today. No charge.

## 2018-02-22 MED ORDER — ACETAMINOPHEN 325 MG PO TABS
650.00 | ORAL_TABLET | ORAL | Status: DC
Start: ? — End: 2018-02-22

## 2018-02-22 MED ORDER — GENERIC EXTERNAL MEDICATION
Status: DC
Start: ? — End: 2018-02-22

## 2018-02-22 MED ORDER — SODIUM CHLORIDE 0.9 % IV SOLN
INTRAVENOUS | Status: DC
Start: ? — End: 2018-02-22

## 2018-02-22 MED ORDER — CO-ENZYME Q-10 30 MG PO CAPS
30.00 | ORAL_CAPSULE | ORAL | Status: DC
Start: 2018-02-21 — End: 2018-02-22

## 2018-02-22 MED ORDER — FERROUS SULFATE 325 (65 FE) MG PO TABS
325.00 | ORAL_TABLET | ORAL | Status: DC
Start: 2018-02-21 — End: 2018-02-22

## 2018-02-22 MED ORDER — LEVOFLOXACIN 750 MG PO TABS
750.00 | ORAL_TABLET | ORAL | Status: DC
Start: 2018-02-20 — End: 2018-02-22

## 2018-02-22 MED ORDER — METOPROLOL TARTRATE 25 MG PO TABS
25.00 | ORAL_TABLET | ORAL | Status: DC
Start: 2018-02-20 — End: 2018-02-22

## 2018-02-22 MED ORDER — VITAMIN D3 25 MCG (1000 UNIT) PO TABS
ORAL_TABLET | ORAL | Status: DC
Start: 2018-02-21 — End: 2018-02-22

## 2018-02-22 MED ORDER — PRAVASTATIN SODIUM 40 MG PO TABS
40.00 | ORAL_TABLET | ORAL | Status: DC
Start: 2018-02-20 — End: 2018-02-22

## 2018-02-22 MED ORDER — PRAMIPEXOLE DIHYDROCHLORIDE 0.25 MG PO TABS
0.25 | ORAL_TABLET | ORAL | Status: DC
Start: 2018-02-20 — End: 2018-02-22

## 2018-02-22 MED ORDER — GABAPENTIN 100 MG PO CAPS
100.00 | ORAL_CAPSULE | ORAL | Status: DC
Start: 2018-02-20 — End: 2018-02-22

## 2018-02-26 MED ORDER — VITAMIN D3 25 MCG (1000 UT) PO TABS
ORAL_TABLET | ORAL | Status: DC
Start: 2018-02-25 — End: 2018-02-26

## 2018-02-26 MED ORDER — COLCHICINE 0.6 MG PO TABS
0.60 | ORAL_TABLET | ORAL | Status: DC
Start: 2018-02-24 — End: 2018-02-26

## 2018-02-26 MED ORDER — GABAPENTIN 100 MG PO CAPS
100.00 | ORAL_CAPSULE | ORAL | Status: DC
Start: 2018-02-24 — End: 2018-02-26

## 2018-02-26 MED ORDER — PRAMIPEXOLE DIHYDROCHLORIDE 0.125 MG PO TABS
0.25 | ORAL_TABLET | ORAL | Status: DC
Start: 2018-02-24 — End: 2018-02-26

## 2018-02-26 MED ORDER — FERROUS SULFATE 325 (65 FE) MG PO TABS
325.00 | ORAL_TABLET | ORAL | Status: DC
Start: 2018-02-25 — End: 2018-02-26

## 2018-02-26 MED ORDER — SODIUM CHLORIDE 0.9 % IV SOLN
INTRAVENOUS | Status: DC
Start: ? — End: 2018-02-26

## 2018-02-26 MED ORDER — METOCLOPRAMIDE HCL 5 MG/ML IJ SOLN
10.00 | INTRAMUSCULAR | Status: DC
Start: ? — End: 2018-02-26

## 2018-02-26 MED ORDER — ACETAMINOPHEN 325 MG PO TABS
650.00 | ORAL_TABLET | ORAL | Status: DC
Start: ? — End: 2018-02-26

## 2018-02-26 MED ORDER — METOPROLOL TARTRATE 25 MG PO TABS
25.00 | ORAL_TABLET | ORAL | Status: DC
Start: 2018-02-24 — End: 2018-02-26

## 2018-02-26 MED ORDER — APIXABAN 5 MG PO TABS
5.00 | ORAL_TABLET | ORAL | Status: DC
Start: 2018-02-24 — End: 2018-02-26

## 2018-02-26 MED ORDER — GENERIC EXTERNAL MEDICATION
Status: DC
Start: ? — End: 2018-02-26

## 2018-02-26 MED ORDER — MELATONIN 3 MG PO TABS
3.00 | ORAL_TABLET | ORAL | Status: DC
Start: ? — End: 2018-02-26

## 2018-02-26 MED ORDER — HYDRALAZINE HCL 20 MG/ML IJ SOLN
10.00 | INTRAMUSCULAR | Status: DC
Start: ? — End: 2018-02-26

## 2018-02-26 MED ORDER — PRAVASTATIN SODIUM 40 MG PO TABS
40.00 | ORAL_TABLET | ORAL | Status: DC
Start: 2018-02-24 — End: 2018-02-26

## 2018-02-26 MED ORDER — CO-ENZYME Q-10 30 MG PO CAPS
30.00 | ORAL_CAPSULE | ORAL | Status: DC
Start: 2018-02-25 — End: 2018-02-26

## 2018-02-26 MED ORDER — NITROGLYCERIN 0.4 MG SL SUBL
0.40 | SUBLINGUAL_TABLET | SUBLINGUAL | Status: DC
Start: ? — End: 2018-02-26

## 2018-02-26 MED ORDER — POLYETHYLENE GLYCOL 3350 17 G PO PACK
17.00 | PACK | ORAL | Status: DC
Start: ? — End: 2018-02-26

## 2018-02-27 DIAGNOSIS — Z9989 Dependence on other enabling machines and devices: Secondary | ICD-10-CM | POA: Diagnosis not present

## 2018-02-27 DIAGNOSIS — Z880 Allergy status to penicillin: Secondary | ICD-10-CM | POA: Diagnosis not present

## 2018-02-27 DIAGNOSIS — G4733 Obstructive sleep apnea (adult) (pediatric): Secondary | ICD-10-CM | POA: Diagnosis not present

## 2018-02-27 DIAGNOSIS — Z881 Allergy status to other antibiotic agents status: Secondary | ICD-10-CM | POA: Diagnosis not present

## 2018-03-06 ENCOUNTER — Encounter: Payer: Medicare HMO | Admitting: Cardiology

## 2018-03-20 DIAGNOSIS — I480 Paroxysmal atrial fibrillation: Secondary | ICD-10-CM | POA: Diagnosis not present

## 2018-03-20 DIAGNOSIS — J90 Pleural effusion, not elsewhere classified: Secondary | ICD-10-CM | POA: Diagnosis not present

## 2018-03-20 DIAGNOSIS — I10 Essential (primary) hypertension: Secondary | ICD-10-CM | POA: Diagnosis not present

## 2018-03-20 DIAGNOSIS — R0602 Shortness of breath: Secondary | ICD-10-CM | POA: Diagnosis not present

## 2018-03-20 DIAGNOSIS — I313 Pericardial effusion (noninflammatory): Secondary | ICD-10-CM | POA: Diagnosis not present

## 2018-03-24 ENCOUNTER — Encounter: Payer: Self-pay | Admitting: Internal Medicine

## 2018-04-08 ENCOUNTER — Ambulatory Visit (INDEPENDENT_AMBULATORY_CARE_PROVIDER_SITE_OTHER): Payer: Medicare HMO | Admitting: Osteopathic Medicine

## 2018-04-08 ENCOUNTER — Encounter: Payer: Self-pay | Admitting: Osteopathic Medicine

## 2018-04-08 VITALS — BP 117/75 | HR 75 | Temp 98.2°F | Wt 142.0 lb

## 2018-04-08 DIAGNOSIS — J90 Pleural effusion, not elsewhere classified: Secondary | ICD-10-CM

## 2018-04-08 DIAGNOSIS — Z7189 Other specified counseling: Secondary | ICD-10-CM

## 2018-04-08 DIAGNOSIS — Z8679 Personal history of other diseases of the circulatory system: Secondary | ICD-10-CM | POA: Diagnosis not present

## 2018-04-08 DIAGNOSIS — Z9889 Other specified postprocedural states: Secondary | ICD-10-CM | POA: Diagnosis not present

## 2018-04-08 DIAGNOSIS — H8110 Benign paroxysmal vertigo, unspecified ear: Secondary | ICD-10-CM | POA: Diagnosis not present

## 2018-04-08 MED ORDER — FUROSEMIDE 20 MG PO TABS: 40.0000 mg | ORAL_TABLET | Freq: Every day | ORAL | 3 refills | Status: DC

## 2018-04-08 MED ORDER — MECLIZINE HCL 12.5 MG PO TABS: 12.5000 mg | ORAL_TABLET | Freq: Two times a day (BID) | ORAL | 3 refills | Status: AC

## 2018-04-08 MED ORDER — ATORVASTATIN CALCIUM 10 MG PO TABS: 10.0000 mg | ORAL_TABLET | Freq: Every day | ORAL | 3 refills | Status: AC

## 2018-04-08 NOTE — Progress Notes (Signed)
HPI: Donna [name] is a 77 y.o. female who  has a past medical history of Atrial fibrillation ([place]) (10/17/2016), Benign paroxysmal positional vertigo (10/18/2016), CAD (coronary atherosclerotic disease) (10/17/2016), Cystitis (10/18/2016), Dysuria (10/18/2016), History of anemia (10/18/2016), History of gastric bypass (10/17/2016), Hyperlipidemia (10/17/2016), Lower extremity edema (10/18/2016), Restless leg syndrome (10/17/2016), Sleep disorder (10/17/2016), Tobacco dependence (10/17/2016), Urinary frequency (10/18/2016), and Vitamin D deficiency (10/18/2016).  she presents to [facility] today, 04/08/18,  for chief complaint of:  Dizziness Hospital follow-up, pericardial effusion  Pericardial effusion - w/ pericarditis and tamponade, 02/20/18, seen by cardio for f/u 03/20/18. She is doing better with daily Lasix dosing. Dizziness is improved as long as she stay son meclizine daily. No major concerns today for herself.   Partner is not doing well, he is forgetting things, acting unusually irritable toward her. We discussed advance directives in light of them not being married - see below   Requests refill on cholesterol meds and dizziness meds.      Past medical history, surgical history, and family history reviewed.  Current medication list and allergy/intolerance information reviewed.   (See remainder of HPI, ROS, Phys Exam below)    ASSESSMENT/PLAN:   Pleural effusion  Status post ablation of atrial fibrillation - Plan: atorvastatin (LIPITOR) 10 MG tablet  Benign paroxysmal positional vertigo, unspecified laterality - Plan: meclizine (ANTIVERT) 12.5 MG tablet  Advance directive discussed with patient - no documentation, she is not married to her partner, her child is her NOK, advised if she wants her partner to be her proxy an advanced directive needs done   Meds ordered this encounter  Medications  . atorvastatin (LIPITOR) 10 MG  tablet    Sig: Take 1 tablet (10 mg total) by mouth at bedtime.    Dispense:  90 tablet    Refill:  3  . furosemide (LASIX) 20 MG tablet    Sig: Take 2 tablets (40 mg total) by mouth daily. Take as directed    Dispense:  90 tablet    Refill:  3  . meclizine (ANTIVERT) 12.5 MG tablet    Sig: Take 1 tablet (12.5 mg total) by mouth 2 (two) times daily.    Dispense:  180 tablet    Refill:  3    Please consider 90 day supplies to promote better adherence     Follow-up plan: Return in about 6 months (around 10/09/2018) for [place], sooner if needed .     ############################################ ############################################ ############################################ ############################################    Outpatient Encounter Medications as of 04/08/2018  Medication Sig  . atorvastatin (LIPITOR) 10 MG tablet Take 1 tablet (10 mg total) by mouth at bedtime.  . Cholecalciferol (VITAMIN D3) 5000 units CAPS Take 5,000 Units by mouth daily.   [name] co-enzyme Q-10 30 MG capsule Take 30 mg by mouth 2 (two) times daily.   [name] ELIQUIS 5 MG TABS tablet TAKE 1 TABLET BY MOUTH TWICE DAILY  . Ferrous Sulfate (IRON) 325 (65 Fe) MG TABS Take 325 mg by mouth daily with breakfast.   . folic acid (FOLVITE) 998 MCG tablet Take 800 mcg by mouth daily.  . furosemide (LASIX) 20 MG tablet Take 1 tablet (20 mg total) by mouth as directed. Take as directed (Patient taking differently: Take 20 mg by mouth as needed for fluid. Take as directed)  . levofloxacin (LEVAQUIN) 500 MG tablet Take 1 tablet (500 mg total) by mouth daily.  [name] loperamide (IMODIUM) 2 MG capsule Take 2 mg by  mouth See admin instructions. After AM bowel movement (as needed for leakage)  . meclizine (ANTIVERT) 12.5 MG tablet TAKE ONE TABLET BY MOUTH TWICE A DAY, every day.  . metoprolol tartrate (LOPRESSOR) 25 MG tablet 1 tablet 2 (two) times daily.  . ondansetron (ZOFRAN-ODT) 8 MG disintegrating tablet  Take 1 tablet (8 mg total) by mouth every 8 (eight) hours as needed for nausea.  . potassium chloride (K-DUR) 10 MEQ tablet Take 1 tablet (10 mEq total) by mouth daily as needed. When you take Lasix  . pramipexole (MIRAPEX) 0.25 MG tablet TAKE ONE TABLET BY MOUTH TWICE DAILY   No facility-administered encounter medications on file as of 04/08/2018.    Allergies  Allergen Reactions  . Penicillins Hives    Has patient had a PCN reaction causing immediate rash, facial/tongue/throat swelling, SOB or lightheadedness with hypotension: Yes Has patient had a PCN reaction causing severe rash involving mucus membranes or skin necrosis: No Has patient had a PCN reaction that required hospitalization: No Has patient had a PCN reaction occurring within the last 10 years: No If all of the above answers are "NO", then may proceed with Cephalosporin use.   . Vancomycin Anaphylaxis  . Tape Rash      Review of Systems:  Constitutional: No recent illness  HEENT: No  headache, no vision change  Cardiac: No  chest pain, No  pressure, No palpitations  Respiratory:  No  shortness of breath. No  Cough  Gastrointestinal: No  abdominal pain  Musculoskeletal: No new myalgia/arthralgia  Skin: No  Rash  Hem/Onc: No  easy bruising/bleeding, No  abnormal lumps/bumps  Neurologic: No  weakness, +Dizziness  Psychiatric: No  concerns with depression, +concerns with anxiety  Exam:  BP 117/75 (BP Location: Left Arm, Patient Position: Sitting, Cuff Size: Normal)   Pulse 75   Temp 98.2 F (36.8 C) (Oral)   Wt 142 lb (64.4 kg)   BMI 24.37 kg/m   Constitutional: VS see above. General Appearance: alert, well-developed, well-nourished, NAD  Eyes: Normal lids and conjunctive, non-icteric sclera  Ears, Nose, Mouth, Throat: MMM, Normal external inspection ears/nares/mouth/lips/gums.  Neck: No masses, trachea midline.   Respiratory: Normal respiratory effort. no wheeze, no rhonchi, no  rales  Cardiovascular: S1/S2 normal, +murmur, no rub/gallop auscultated  Musculoskeletal: Gait normal. Symmetric and independent movement of all extremities  Neurological: Normal balance/coordination. No tremor.  Skin: warm, dry, intact.   Psychiatric: Normal judgment/insight. Normal mood and affect. Oriented x3.   Visit summary with medication list and pertinent instructions was printed for patient to review, advised to alert [place] if any changes needed. All questions at time of visit were answered - patient instructed to contact office with any additional concerns. ER/RTC precautions were reviewed with the patient and understanding verbalized.   Follow-up plan: Return in about 6 months (around 10/09/2018) for [facility], sooner if needed .  Note: Total time spent 25 minutes, greater than 50% of the visit was spent face-to-face counseling and coordinating care for the following: The primary encounter diagnosis was Pleural effusion. Diagnoses of Status post ablation of atrial fibrillation, Benign paroxysmal positional vertigo, unspecified laterality, and Advance directive discussed with patient were also pertinent to this visit.[name]  Please note: voice recognition software was used to produce this document, and typos may escape review. Please contact Dr. Sheppard Coil for any needed clarifications.

## 2018-04-09 ENCOUNTER — Encounter: Payer: Self-pay | Admitting: Osteopathic Medicine

## 2018-04-10 DIAGNOSIS — I10 Essential (primary) hypertension: Secondary | ICD-10-CM | POA: Diagnosis not present

## 2018-04-10 DIAGNOSIS — E7849 Other hyperlipidemia: Secondary | ICD-10-CM | POA: Diagnosis not present

## 2018-04-10 DIAGNOSIS — R3911 Hesitancy of micturition: Secondary | ICD-10-CM | POA: Diagnosis not present

## 2018-04-10 DIAGNOSIS — I480 Paroxysmal atrial fibrillation: Secondary | ICD-10-CM | POA: Diagnosis not present

## 2018-04-10 DIAGNOSIS — Z950 Presence of cardiac pacemaker: Secondary | ICD-10-CM | POA: Diagnosis not present

## 2018-04-10 DIAGNOSIS — I309 Acute pericarditis, unspecified: Secondary | ICD-10-CM | POA: Diagnosis not present

## 2018-04-10 DIAGNOSIS — I313 Pericardial effusion (noninflammatory): Secondary | ICD-10-CM | POA: Diagnosis not present

## 2018-04-11 DIAGNOSIS — Z45010 Encounter for checking and testing of cardiac pacemaker pulse generator [battery]: Secondary | ICD-10-CM | POA: Diagnosis not present

## 2018-04-11 DIAGNOSIS — Z45018 Encounter for adjustment and management of other part of cardiac pacemaker: Secondary | ICD-10-CM | POA: Diagnosis not present

## 2018-04-23 ENCOUNTER — Encounter: Payer: Medicare HMO | Admitting: *Deleted

## 2018-05-02 DIAGNOSIS — Z0100 Encounter for examination of eyes and vision without abnormal findings: Secondary | ICD-10-CM | POA: Diagnosis not present

## 2018-05-02 DIAGNOSIS — H35363 Drusen (degenerative) of macula, bilateral: Secondary | ICD-10-CM | POA: Diagnosis not present

## 2018-05-02 DIAGNOSIS — H524 Presbyopia: Secondary | ICD-10-CM | POA: Diagnosis not present

## 2018-05-06 DIAGNOSIS — B351 Tinea unguium: Secondary | ICD-10-CM | POA: Diagnosis not present

## 2018-05-06 DIAGNOSIS — E119 Type 2 diabetes mellitus without complications: Secondary | ICD-10-CM | POA: Diagnosis not present

## 2018-05-06 DIAGNOSIS — L84 Corns and callosities: Secondary | ICD-10-CM | POA: Diagnosis not present

## 2018-05-06 DIAGNOSIS — I10 Essential (primary) hypertension: Secondary | ICD-10-CM | POA: Diagnosis not present

## 2018-05-06 DIAGNOSIS — M216X2 Other acquired deformities of left foot: Secondary | ICD-10-CM | POA: Diagnosis not present

## 2018-07-17 DIAGNOSIS — Z45010 Encounter for checking and testing of cardiac pacemaker pulse generator [battery]: Secondary | ICD-10-CM | POA: Diagnosis not present

## 2018-07-17 DIAGNOSIS — Z45018 Encounter for adjustment and management of other part of cardiac pacemaker: Secondary | ICD-10-CM | POA: Diagnosis not present

## 2018-08-01 DIAGNOSIS — E119 Type 2 diabetes mellitus without complications: Secondary | ICD-10-CM | POA: Diagnosis not present

## 2018-08-01 DIAGNOSIS — M216X2 Other acquired deformities of left foot: Secondary | ICD-10-CM | POA: Diagnosis not present

## 2018-08-06 DIAGNOSIS — E119 Type 2 diabetes mellitus without complications: Secondary | ICD-10-CM | POA: Diagnosis not present

## 2018-08-06 DIAGNOSIS — B351 Tinea unguium: Secondary | ICD-10-CM | POA: Diagnosis not present

## 2018-08-09 DIAGNOSIS — I480 Paroxysmal atrial fibrillation: Secondary | ICD-10-CM | POA: Diagnosis not present

## 2018-08-23 DIAGNOSIS — S8001XA Contusion of right knee, initial encounter: Secondary | ICD-10-CM | POA: Diagnosis not present

## 2018-08-23 DIAGNOSIS — M1711 Unilateral primary osteoarthritis, right knee: Secondary | ICD-10-CM | POA: Diagnosis not present

## 2018-08-23 DIAGNOSIS — Z9109 Other allergy status, other than to drugs and biological substances: Secondary | ICD-10-CM | POA: Diagnosis not present

## 2018-08-23 DIAGNOSIS — S0990XA Unspecified injury of head, initial encounter: Secondary | ICD-10-CM | POA: Diagnosis not present

## 2018-08-23 DIAGNOSIS — W182XXA Fall in (into) shower or empty bathtub, initial encounter: Secondary | ICD-10-CM | POA: Diagnosis not present

## 2018-08-23 DIAGNOSIS — G8911 Acute pain due to trauma: Secondary | ICD-10-CM | POA: Diagnosis not present

## 2018-08-23 DIAGNOSIS — S79921A Unspecified injury of right thigh, initial encounter: Secondary | ICD-10-CM | POA: Diagnosis not present

## 2018-08-23 DIAGNOSIS — M19031 Primary osteoarthritis, right wrist: Secondary | ICD-10-CM | POA: Diagnosis not present

## 2018-08-23 DIAGNOSIS — M25551 Pain in right hip: Secondary | ICD-10-CM | POA: Diagnosis not present

## 2018-08-23 DIAGNOSIS — S79911A Unspecified injury of right hip, initial encounter: Secondary | ICD-10-CM | POA: Diagnosis not present

## 2018-08-23 DIAGNOSIS — M25531 Pain in right wrist: Secondary | ICD-10-CM | POA: Diagnosis not present

## 2018-08-23 DIAGNOSIS — Z7901 Long term (current) use of anticoagulants: Secondary | ICD-10-CM | POA: Diagnosis not present

## 2018-08-23 DIAGNOSIS — Z880 Allergy status to penicillin: Secondary | ICD-10-CM | POA: Diagnosis not present

## 2018-08-23 DIAGNOSIS — Z888 Allergy status to other drugs, medicaments and biological substances status: Secondary | ICD-10-CM | POA: Diagnosis not present

## 2018-08-23 DIAGNOSIS — R55 Syncope and collapse: Secondary | ICD-10-CM | POA: Diagnosis not present

## 2018-08-23 DIAGNOSIS — Z87892 Personal history of anaphylaxis: Secondary | ICD-10-CM | POA: Diagnosis not present

## 2018-08-23 DIAGNOSIS — M160 Bilateral primary osteoarthritis of hip: Secondary | ICD-10-CM | POA: Diagnosis not present

## 2018-08-23 DIAGNOSIS — S7001XA Contusion of right hip, initial encounter: Secondary | ICD-10-CM | POA: Diagnosis not present

## 2018-08-23 DIAGNOSIS — Z79899 Other long term (current) drug therapy: Secondary | ICD-10-CM | POA: Diagnosis not present

## 2018-08-27 ENCOUNTER — Other Ambulatory Visit: Payer: Self-pay | Admitting: Osteopathic Medicine

## 2018-08-27 DIAGNOSIS — Z8679 Personal history of other diseases of the circulatory system: Secondary | ICD-10-CM

## 2018-08-27 DIAGNOSIS — Z9889 Other specified postprocedural states: Principal | ICD-10-CM

## 2018-08-27 NOTE — Telephone Encounter (Signed)
Pt called this morning stating that she needs a refill on metropolol (25mg  tablets today, because she going out of town early tomorrow morning. She will be gone for 10 days and out of this med.

## 2018-08-27 NOTE — Telephone Encounter (Signed)
Pt has been updated. No other inquiries during call.  

## 2018-08-27 NOTE — Telephone Encounter (Signed)
Looks like this is meant for [name], DO not the [facility] ([facility] [place]).  Maybe for her nurse/CMA pool.

## 2018-08-27 NOTE — Telephone Encounter (Signed)
Pt seen at [facility] not [facility]  Rx request

## 2018-09-23 ENCOUNTER — Other Ambulatory Visit: Payer: Self-pay

## 2018-09-23 DIAGNOSIS — Z8679 Personal history of other diseases of the circulatory system: Secondary | ICD-10-CM

## 2018-09-23 DIAGNOSIS — Z9889 Other specified postprocedural states: Principal | ICD-10-CM

## 2018-09-23 MED ORDER — FUROSEMIDE 20 MG PO TABS: 40.0000 mg | ORAL_TABLET | Freq: Every day | ORAL | 3 refills | Status: AC

## 2018-10-11 DIAGNOSIS — I10 Essential (primary) hypertension: Secondary | ICD-10-CM | POA: Diagnosis not present

## 2018-10-11 DIAGNOSIS — R42 Dizziness and giddiness: Secondary | ICD-10-CM | POA: Diagnosis not present

## 2018-10-11 DIAGNOSIS — E7849 Other hyperlipidemia: Secondary | ICD-10-CM | POA: Diagnosis not present

## 2018-10-11 DIAGNOSIS — I495 Sick sinus syndrome: Secondary | ICD-10-CM | POA: Diagnosis not present

## 2018-10-11 DIAGNOSIS — I480 Paroxysmal atrial fibrillation: Secondary | ICD-10-CM | POA: Diagnosis not present

## 2018-10-11 DIAGNOSIS — Z950 Presence of cardiac pacemaker: Secondary | ICD-10-CM | POA: Diagnosis not present

## 2018-11-04 DIAGNOSIS — E785 Hyperlipidemia, unspecified: Secondary | ICD-10-CM | POA: Diagnosis not present

## 2018-11-04 DIAGNOSIS — I10 Essential (primary) hypertension: Secondary | ICD-10-CM | POA: Diagnosis not present

## 2018-11-04 DIAGNOSIS — I480 Paroxysmal atrial fibrillation: Secondary | ICD-10-CM | POA: Diagnosis not present

## 2018-11-04 DIAGNOSIS — L909 Atrophic disorder of skin, unspecified: Secondary | ICD-10-CM | POA: Diagnosis not present

## 2018-11-07 DIAGNOSIS — L0501 Pilonidal cyst with abscess: Secondary | ICD-10-CM | POA: Diagnosis not present

## 2018-11-11 ENCOUNTER — Other Ambulatory Visit: Payer: Self-pay | Admitting: Osteopathic Medicine

## 2018-11-11 DIAGNOSIS — Z23 Encounter for immunization: Secondary | ICD-10-CM | POA: Diagnosis not present

## 2018-11-11 DIAGNOSIS — G2581 Restless legs syndrome: Secondary | ICD-10-CM

## 2018-11-19 DIAGNOSIS — M2012 Hallux valgus (acquired), left foot: Secondary | ICD-10-CM | POA: Diagnosis not present

## 2018-11-19 DIAGNOSIS — I10 Essential (primary) hypertension: Secondary | ICD-10-CM | POA: Diagnosis not present

## 2018-11-19 DIAGNOSIS — M21612 Bunion of left foot: Secondary | ICD-10-CM | POA: Diagnosis not present

## 2018-11-19 DIAGNOSIS — E1169 Type 2 diabetes mellitus with other specified complication: Secondary | ICD-10-CM | POA: Diagnosis not present

## 2018-11-19 DIAGNOSIS — B351 Tinea unguium: Secondary | ICD-10-CM | POA: Diagnosis not present

## 2018-11-19 DIAGNOSIS — M2042 Other hammer toe(s) (acquired), left foot: Secondary | ICD-10-CM | POA: Diagnosis not present

## 2018-11-19 DIAGNOSIS — M21611 Bunion of right foot: Secondary | ICD-10-CM | POA: Diagnosis not present

## 2018-11-19 DIAGNOSIS — M2011 Hallux valgus (acquired), right foot: Secondary | ICD-10-CM | POA: Diagnosis not present

## 2018-11-19 DIAGNOSIS — M2041 Other hammer toe(s) (acquired), right foot: Secondary | ICD-10-CM | POA: Diagnosis not present

## 2018-11-19 DIAGNOSIS — E1142 Type 2 diabetes mellitus with diabetic polyneuropathy: Secondary | ICD-10-CM | POA: Diagnosis not present

## 2018-11-22 DIAGNOSIS — L89159 Pressure ulcer of sacral region, unspecified stage: Secondary | ICD-10-CM | POA: Diagnosis not present

## 2018-12-02 DIAGNOSIS — L0591 Pilonidal cyst without abscess: Secondary | ICD-10-CM | POA: Diagnosis not present

## 2018-12-02 DIAGNOSIS — L97921 Non-pressure chronic ulcer of unspecified part of left lower leg limited to breakdown of skin: Secondary | ICD-10-CM | POA: Diagnosis not present

## 2018-12-05 DIAGNOSIS — I10 Essential (primary) hypertension: Secondary | ICD-10-CM | POA: Diagnosis not present

## 2018-12-05 DIAGNOSIS — L0591 Pilonidal cyst without abscess: Secondary | ICD-10-CM | POA: Diagnosis not present

## 2018-12-06 DIAGNOSIS — I10 Essential (primary) hypertension: Secondary | ICD-10-CM | POA: Diagnosis not present

## 2018-12-06 DIAGNOSIS — R42 Dizziness and giddiness: Secondary | ICD-10-CM | POA: Diagnosis not present

## 2018-12-06 DIAGNOSIS — I480 Paroxysmal atrial fibrillation: Secondary | ICD-10-CM | POA: Diagnosis not present

## 2018-12-06 DIAGNOSIS — E7849 Other hyperlipidemia: Secondary | ICD-10-CM | POA: Diagnosis not present

## 2018-12-09 DIAGNOSIS — I083 Combined rheumatic disorders of mitral, aortic and tricuspid valves: Secondary | ICD-10-CM | POA: Diagnosis not present

## 2018-12-14 ENCOUNTER — Other Ambulatory Visit: Payer: Self-pay | Admitting: Osteopathic Medicine

## 2018-12-14 DIAGNOSIS — G2581 Restless legs syndrome: Secondary | ICD-10-CM

## 2018-12-17 DIAGNOSIS — Z87891 Personal history of nicotine dependence: Secondary | ICD-10-CM | POA: Diagnosis not present

## 2018-12-17 DIAGNOSIS — R350 Frequency of micturition: Secondary | ICD-10-CM | POA: Diagnosis not present

## 2018-12-17 DIAGNOSIS — R531 Weakness: Secondary | ICD-10-CM | POA: Diagnosis not present

## 2018-12-17 DIAGNOSIS — E119 Type 2 diabetes mellitus without complications: Secondary | ICD-10-CM | POA: Diagnosis not present

## 2018-12-17 DIAGNOSIS — I10 Essential (primary) hypertension: Secondary | ICD-10-CM | POA: Diagnosis not present

## 2018-12-17 DIAGNOSIS — I493 Ventricular premature depolarization: Secondary | ICD-10-CM | POA: Diagnosis not present

## 2018-12-17 DIAGNOSIS — R42 Dizziness and giddiness: Secondary | ICD-10-CM | POA: Diagnosis not present

## 2018-12-17 NOTE — Telephone Encounter (Signed)
Harlene Salts, PA-C PCP - General  12/11/2018 End  12/11/18  Phone: [phone]; Fax: [phone]     The above person is her listed PCP can we confirm that patient will be seeing this person or following up with me? I haven't seen her since 03/2018, ok to fill 30 days but needs appt here or needs to transition meds to new PCP

## 2018-12-17 NOTE — Telephone Encounter (Signed)
Per pt - new PCP is PA-C Kathleen Lime. Pt is requesting that we denied any future electronic refill request for meds from pharmacy.

## 2018-12-18 DIAGNOSIS — I10 Essential (primary) hypertension: Secondary | ICD-10-CM | POA: Diagnosis not present

## 2018-12-18 DIAGNOSIS — R42 Dizziness and giddiness: Secondary | ICD-10-CM | POA: Diagnosis not present

## 2018-12-24 ENCOUNTER — Encounter: Payer: Self-pay | Admitting: General Surgery

## 2018-12-24 ENCOUNTER — Ambulatory Visit: Payer: Medicare Other | Admitting: General Surgery

## 2018-12-24 VITALS — BP 127/76 | HR 78 | Temp 98.0°F | Resp 16 | Wt 145.6 lb

## 2018-12-24 DIAGNOSIS — L0591 Pilonidal cyst without abscess: Secondary | ICD-10-CM | POA: Diagnosis not present

## 2018-12-24 NOTE — Patient Instructions (Signed)
Pilonidal Cyst    A pilonidal cyst is a fluid-filled sac that forms beneath the skin near the tailbone, at the top of the crease of the buttocks (pilonidal area). If the cyst is not large and not infected, it may not cause any problems.  If the cyst becomes irritated or infected, it may get larger and fill with pus. An infected cyst is called an abscess. A pilonidal abscess may cause pain and swelling, and it may need to be drained or removed.  What are the causes?  The cause of this condition is not always known. In some cases, a hair that grows into your skin (ingrown hair) may be the cause.  What increases the risk?  You are more likely to get a pilonidal cyst if you:   Are female.   Have lots of hair near the crease of the buttocks.   Are overweight.   Have a dimple near the crease of the buttocks.   Wear tight clothing.   Do not bathe or shower often.   Sit for long periods of time.  What are the signs or symptoms?  Signs and symptoms of a pilonidal cyst may include pain, swelling, redness, and warmth in the pilonidal area. Depending on how big the cyst is, you may be able to feel a lump near your tailbone. If your cyst becomes infected, symptoms may include:   Pus or fluid drainage.   Fever.   Pain, swelling, and redness getting worse.   The lump getting bigger.  How is this diagnosed?  This condition may be diagnosed based on:   Your symptoms and medical history.   A physical exam.   A blood test to check for infection.   Testing a pus sample, if applicable.  How is this treated?  If your cyst does not cause symptoms, you may not need any treatment. If your cyst bothers you or is infected, you may need a procedure to drain or remove the cyst. Depending on the size, location, and severity of your cyst, your health care provider may:   Make an incision in the cyst and drain it (incision and drainage).   Open and drain the cyst, and then stitch the wound so that it stays open while it heals  (marsupialization). You will be given instructions about how to care for your open wound while it heals.   Remove all or part of the cyst, and then close the wound (cyst removal).  You may need to take antibiotic medicines before your procedure.  Follow these instructions at home:  Medicines   Take over-the-counter and prescription medicines only as told by your health care provider.   If you were prescribed an antibiotic medicine, take it as told by your health care provider. Do not stop taking the antibiotic even if you start to feel better.  General instructions   Keep the area around your pilonidal cyst clean and dry.   If there is fluid or pus draining from your cyst:  ? Cover the area with a clean bandage (dressing) as needed.  ? Wash the area gently with soap and water. Pat the area dry with a clean towel. Do not rub the area because that may cause bleeding.   Remove hair from the area around the cyst only if your health care provider tells you to do this.   Do not wear tight pants or sit in one position for long periods at a time.   Keep all follow-up   visits as told by your health care provider. This is important.  Contact a health care provider if you have:   New redness, swelling, or pain.   A fever.   Severe pain.  Summary   A pilonidal cyst is a fluid-filled sac that forms beneath the skin near the tailbone, at the top of the crease of the buttocks (pilonidal area).   If the cyst becomes irritated or infected, it may get larger and fill with pus. An infected cyst is called an abscess.   The cause of this condition is not always known. In some cases, a hair that grows into your skin (ingrown hair) may be the cause.   If your cyst does not cause symptoms, you may not need any treatment. If your cyst bothers you or is infected, you may need a procedure to drain or remove the cyst.  This information is not intended to replace advice given to you by your health care provider. Make sure you  discuss any questions you have with your health care provider.  Document Released: 11/03/2000 Document Revised: 10/25/2017 Document Reviewed: 10/25/2017  Elsevier Interactive Patient Education  2019 [employer]

## 2018-12-25 NOTE — Progress Notes (Signed)
Donna [name]; [id number]; [birth year]   HPI Patient is a 78 year old white female who was referred to my care by Kathleen Lime for evaluation treatment of the wound over her coccyx.  Patient states she had excision of a pilonidal cyst in the remote past.  This was complicated by infection and repeat surgery.  Over the years, she has had intermittent drainage from the lower aspect of the wound.  She was told at one point that she had a recurrent pilonidal cyst as hair was removed.  Over the past few months, she has seeped multiple opinions as to whether the pilonidal cyst has recurred.  She is currently seeing at a wound care center which has been placing some packing on the surface of the skin.  Patient does have some tenderness in this region.  She denies any recent drainage from the wound.  She currently has 0 out of 10 pain at the site. Past Medical History:  Diagnosis Date  . Atrial fibrillation ([place]) 10/17/2016   a. On Metoprolol, Eliquis, amio;  b. 07/2017 s/p RFCA.  [name] Benign paroxysmal positional vertigo 10/18/2016  . CAD (coronary atherosclerotic disease) 10/17/2016   Hx MI  . Cystitis 10/18/2016  . Dysuria 10/18/2016  . History of anemia 10/18/2016  . History of gastric bypass 10/17/2016  . Hyperlipidemia 10/17/2016  . Lower extremity edema 10/18/2016  . Restless leg syndrome 10/17/2016  . Sleep disorder 10/17/2016   Modafinil for sleep apnea and narcolepsy  . Tobacco dependence 10/17/2016  . Urinary frequency 10/18/2016  . Vitamin D deficiency 10/18/2016    Past Surgical History:  Procedure Laterality Date  . ATRIAL FIBRILLATION ABLATION N/A 08/10/2017   Procedure: Atrial Fibrillation Ablation;  Surgeon: [name], MD;  Location: [place] [facility];  Service: Cardiovascular;  Laterality: N/A;  . CARDIAC CATHETERIZATION    . CARDIOVERSION N/A 04/27/2017   Procedure: CARDIOVERSION;  Surgeon: [name], MD;  Location: [facility] ENDOSCOPY;  Service: Cardiovascular;   Laterality: N/A;  . CARDIOVERSION N/A 05/24/2017   Procedure: CARDIOVERSION;  Surgeon: [name], MD;  Location: [facility] ENDOSCOPY;  Service: Cardiovascular;  Laterality: N/A;  . CARDIOVERSION N/A 09/24/2017   Procedure: CARDIOVERSION;  Surgeon: [name], MD;  Location: [place] [facility];  Service: Cardiovascular;  Laterality: N/A;    Family History  Problem Relation Age of Onset  . Emphysema Mother   . Heart disease Father   . Diabetes Father   . Emphysema Father   . Hypertension Sister   . Bladder Cancer Brother   . Hypertension Brother   . Heart attack Maternal Grandmother   . Kidney failure Paternal Grandmother   . Stroke Paternal Grandfather   . Hypertension Brother     Current Outpatient Medications on File Prior to Visit  Medication Sig Dispense Refill  . atorvastatin (LIPITOR) 10 MG tablet Take 1 tablet (10 mg total) by mouth at bedtime. 90 tablet 3  . Cholecalciferol (VITAMIN D3) 5000 units CAPS Take 5,000 Units by mouth daily.     [name] co-enzyme Q-10 30 MG capsule Take 30 mg by mouth 2 (two) times daily.     [name] ELIQUIS 5 MG TABS tablet TAKE 1 TABLET BY MOUTH TWICE DAILY 60 tablet 11  . Ferrous Sulfate (IRON) 325 (65 Fe) MG TABS Take 325 mg by mouth daily with breakfast.     . folic acid (FOLVITE) 716 MCG tablet Take 800 mcg by mouth daily.    . furosemide (LASIX) 20 MG tablet Take 2  tablets (40 mg total) by mouth daily. Take as directed 90 tablet 3  . meclizine (ANTIVERT) 12.5 MG tablet Take 1 tablet (12.5 mg total) by mouth 2 (two) times daily. 180 tablet 3  . ondansetron (ZOFRAN-ODT) 8 MG disintegrating tablet Take 1 tablet (8 mg total) by mouth every 8 (eight) hours as needed for nausea. 20 tablet 3  . pramipexole (MIRAPEX) 0.25 MG tablet TAKE 1 TABLET BY MOUTH TWICE DAILY 60 tablet 0   No current facility-administered medications on file prior to visit.     Allergies  Allergen Reactions  . Penicillins Hives    Has patient had a PCN reaction causing  immediate rash, facial/tongue/throat swelling, SOB or lightheadedness with hypotension: Yes Has patient had a PCN reaction causing severe rash involving mucus membranes or skin necrosis: No Has patient had a PCN reaction that required hospitalization: No Has patient had a PCN reaction occurring within the last 10 years: No If all of the above answers are "NO", then may proceed with Cephalosporin use.   . Vancomycin Anaphylaxis  . Other Rash  . Gabapentin Nausea Only  . Tape Rash    Social History   Substance and Sexual Activity  Alcohol Use No    Social History   Tobacco Use  Smoking Status Former Smoker  . Packs/day: 1.00  . Years: 55.00  . Pack years: 55.00  . Last attempt to quit: 11/21/2015  . Years since quitting: 3.0  Smokeless Tobacco Never Used    Review of Systems  Constitutional: Positive for chills and malaise/fatigue.  HENT: Negative.   Eyes: Negative.   Respiratory: Negative.   Cardiovascular: Negative.   Gastrointestinal: Positive for heartburn and nausea.  Genitourinary: Positive for urgency.  Musculoskeletal: Positive for back pain, joint pain and neck pain.  Skin: Negative.   Neurological: Negative.   Endo/Heme/Allergies: Bruises/bleeds easily.  Psychiatric/Behavioral: Negative.     Objective   Vitals:   12/24/18 1311  BP: 127/76  Pulse: 78  Resp: 16  Temp: 98 F (36.7 C)    Physical Exam Vitals signs reviewed.  Constitutional:      Appearance: Normal appearance. She is normal weight. She is not ill-appearing.  HENT:     Head: Normocephalic and atraumatic.  Cardiovascular:     Rate and Rhythm: Normal rate and regular rhythm.     Heart sounds: Normal heart sounds. No murmur. No friction rub. No gallop.   Pulmonary:     Effort: Pulmonary effort is normal. No respiratory distress.     Breath sounds: Normal breath sounds. No stridor. No wheezing, rhonchi or rales.  Skin:    General: Skin is warm and dry.     Comments: Surgical scar  present in the midline over the coccyx.  Punctate nondraining retracted wound present towards the coccyx.  It is along the inferior aspect of the incision.  No drainage is noted.  No erythema or induration is present.  Neurological:     Mental Status: She is alert and oriented to person, place, and time.     Assessment  Recurrent pilonidal cyst, resolving Plan   As patient has had no further drainage from this wound, no need for surgical intervention at this time.  Obviously, any intervention would require her being off the Eliquis which she is taking for intermittent atrial fibrillation.  She will keep the wound clean and dry.  Will follow expectantly with her.  She was instructed to return should the wound started to drain.

## 2019-01-03 DIAGNOSIS — R55 Syncope and collapse: Secondary | ICD-10-CM | POA: Diagnosis not present

## 2019-01-03 DIAGNOSIS — I10 Essential (primary) hypertension: Secondary | ICD-10-CM | POA: Diagnosis not present

## 2019-01-03 DIAGNOSIS — R42 Dizziness and giddiness: Secondary | ICD-10-CM | POA: Diagnosis not present

## 2019-01-03 DIAGNOSIS — R200 Anesthesia of skin: Secondary | ICD-10-CM | POA: Diagnosis not present

## 2019-01-14 DIAGNOSIS — K5641 Fecal impaction: Secondary | ICD-10-CM | POA: Diagnosis not present

## 2019-01-14 DIAGNOSIS — Z86010 Personal history of colonic polyps: Secondary | ICD-10-CM | POA: Diagnosis not present

## 2019-01-14 DIAGNOSIS — K5900 Constipation, unspecified: Secondary | ICD-10-CM | POA: Diagnosis not present

## 2019-01-15 DIAGNOSIS — Z45018 Encounter for adjustment and management of other part of cardiac pacemaker: Secondary | ICD-10-CM | POA: Diagnosis not present

## 2019-01-15 DIAGNOSIS — Z45010 Encounter for checking and testing of cardiac pacemaker pulse generator [battery]: Secondary | ICD-10-CM | POA: Diagnosis not present

## 2019-01-23 ENCOUNTER — Other Ambulatory Visit: Payer: Self-pay | Admitting: Osteopathic Medicine

## 2019-01-23 DIAGNOSIS — G2581 Restless legs syndrome: Secondary | ICD-10-CM

## 2019-01-23 NOTE — Telephone Encounter (Signed)
Routing to PCP at patient's primary location.

## 2019-01-27 NOTE — Telephone Encounter (Signed)
This was sent to [facility] She is not our patient

## 2019-01-30 DIAGNOSIS — D125 Benign neoplasm of sigmoid colon: Secondary | ICD-10-CM | POA: Diagnosis not present

## 2019-01-30 DIAGNOSIS — D123 Benign neoplasm of transverse colon: Secondary | ICD-10-CM | POA: Diagnosis not present

## 2019-01-30 DIAGNOSIS — Z1211 Encounter for screening for malignant neoplasm of colon: Secondary | ICD-10-CM | POA: Diagnosis not present

## 2019-01-30 DIAGNOSIS — D122 Benign neoplasm of ascending colon: Secondary | ICD-10-CM | POA: Diagnosis not present

## 2019-02-01 ENCOUNTER — Other Ambulatory Visit: Payer: Self-pay | Admitting: Osteopathic Medicine

## 2019-02-01 DIAGNOSIS — I4891 Unspecified atrial fibrillation: Secondary | ICD-10-CM

## 2019-02-26 DIAGNOSIS — Z0000 Encounter for general adult medical examination without abnormal findings: Secondary | ICD-10-CM | POA: Diagnosis not present

## 2019-03-10 DIAGNOSIS — E7849 Other hyperlipidemia: Secondary | ICD-10-CM | POA: Diagnosis not present

## 2019-03-10 DIAGNOSIS — I495 Sick sinus syndrome: Secondary | ICD-10-CM | POA: Diagnosis not present

## 2019-03-10 DIAGNOSIS — R5383 Other fatigue: Secondary | ICD-10-CM | POA: Diagnosis not present

## 2019-03-10 DIAGNOSIS — I480 Paroxysmal atrial fibrillation: Secondary | ICD-10-CM | POA: Diagnosis not present

## 2019-03-10 DIAGNOSIS — G4733 Obstructive sleep apnea (adult) (pediatric): Secondary | ICD-10-CM | POA: Diagnosis not present

## 2019-03-24 DIAGNOSIS — Z950 Presence of cardiac pacemaker: Secondary | ICD-10-CM | POA: Diagnosis not present

## 2019-03-24 DIAGNOSIS — I313 Pericardial effusion (noninflammatory): Secondary | ICD-10-CM | POA: Diagnosis not present

## 2019-03-24 DIAGNOSIS — I480 Paroxysmal atrial fibrillation: Secondary | ICD-10-CM | POA: Diagnosis not present

## 2019-03-24 DIAGNOSIS — I495 Sick sinus syndrome: Secondary | ICD-10-CM | POA: Diagnosis not present

## 2019-03-28 DIAGNOSIS — G4733 Obstructive sleep apnea (adult) (pediatric): Secondary | ICD-10-CM | POA: Diagnosis not present

## 2019-03-28 DIAGNOSIS — G47419 Narcolepsy without cataplexy: Secondary | ICD-10-CM | POA: Diagnosis not present

## 2019-04-04 DIAGNOSIS — I495 Sick sinus syndrome: Secondary | ICD-10-CM | POA: Diagnosis not present

## 2019-04-04 DIAGNOSIS — I493 Ventricular premature depolarization: Secondary | ICD-10-CM | POA: Diagnosis not present

## 2019-04-04 DIAGNOSIS — R9431 Abnormal electrocardiogram [ECG] [EKG]: Secondary | ICD-10-CM | POA: Diagnosis not present

## 2019-04-04 DIAGNOSIS — I38 Endocarditis, valve unspecified: Secondary | ICD-10-CM | POA: Diagnosis not present

## 2019-04-04 DIAGNOSIS — I4891 Unspecified atrial fibrillation: Secondary | ICD-10-CM | POA: Diagnosis not present

## 2019-04-09 DIAGNOSIS — G4733 Obstructive sleep apnea (adult) (pediatric): Secondary | ICD-10-CM | POA: Diagnosis not present

## 2019-04-09 DIAGNOSIS — G47419 Narcolepsy without cataplexy: Secondary | ICD-10-CM | POA: Diagnosis not present

## 2019-04-14 IMAGING — CR DG CHEST 2V
2 series · 2 of 2 positions shown · non-contrast
Comparison: None

CLINICAL DATA: Lightheadedness, uncomfortable feeling in chest,
atrial fibrillation, hypertension, former smoker

EXAM:
CHEST  2 VIEW

[chest lat]
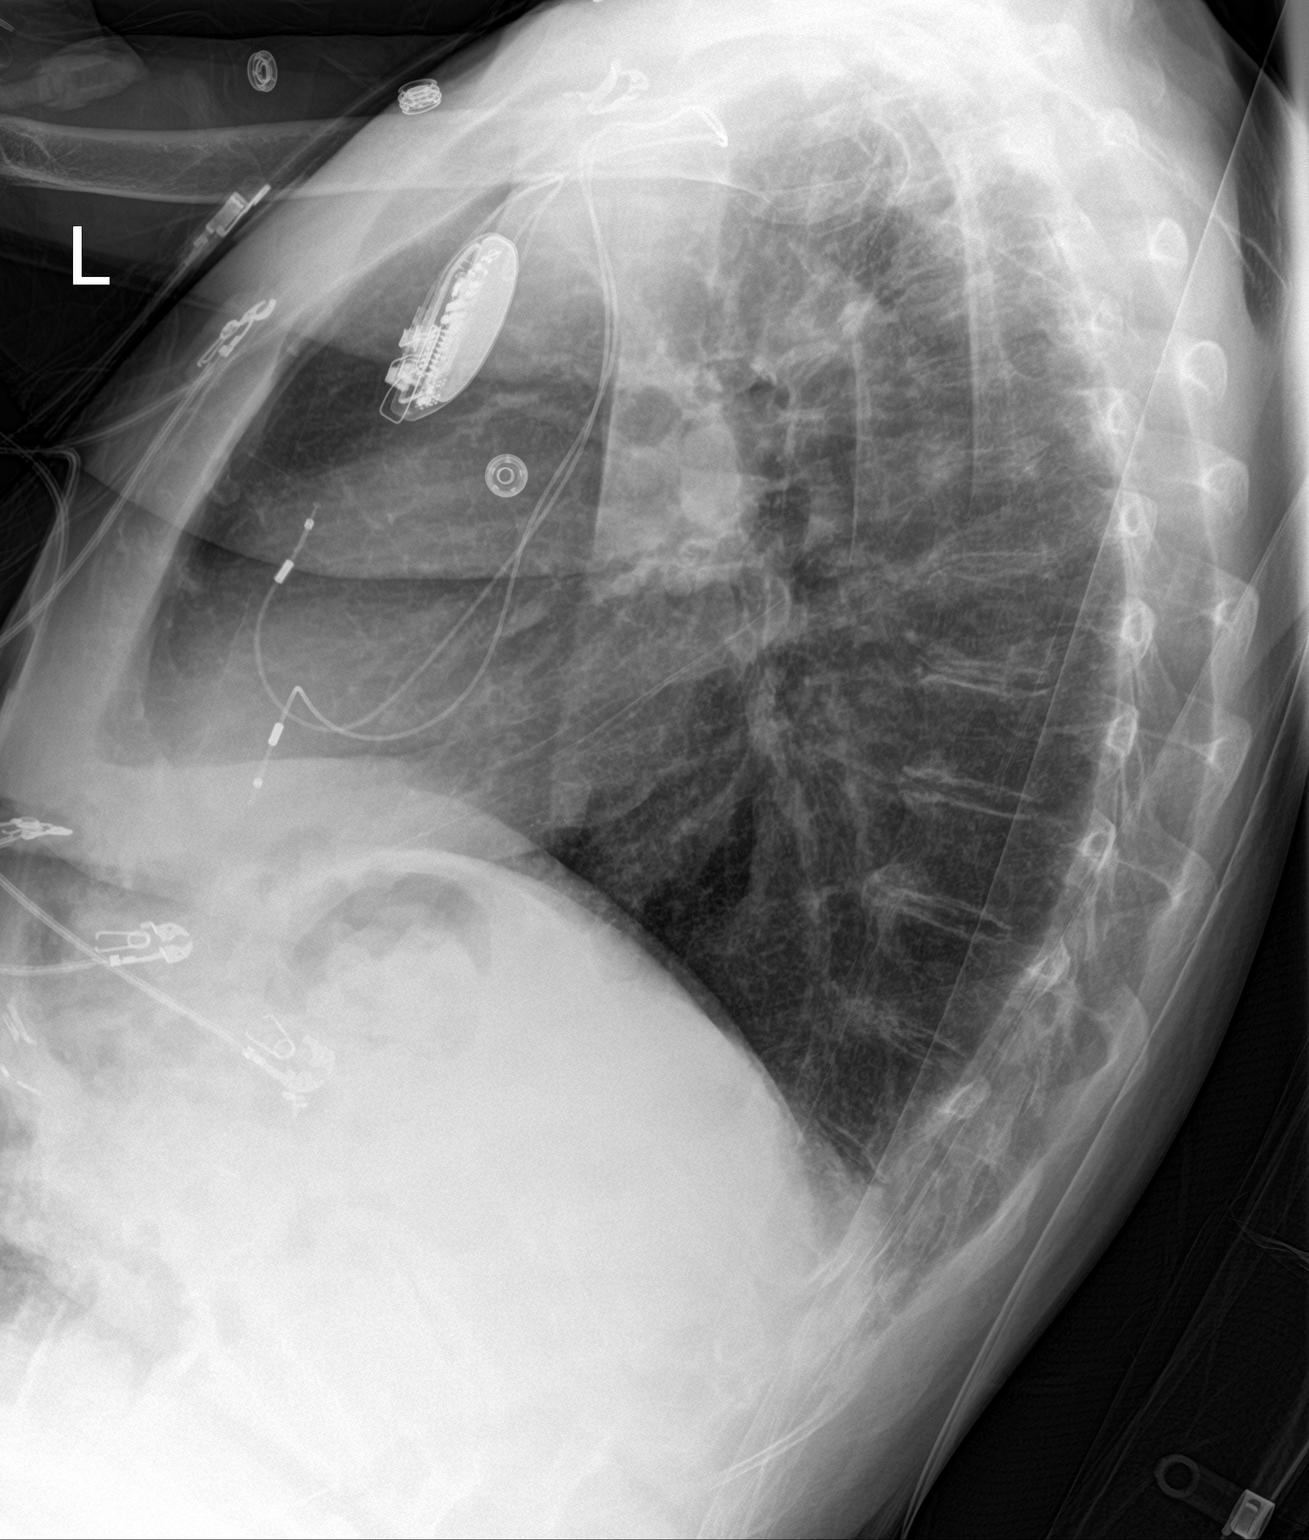

[chest ap]
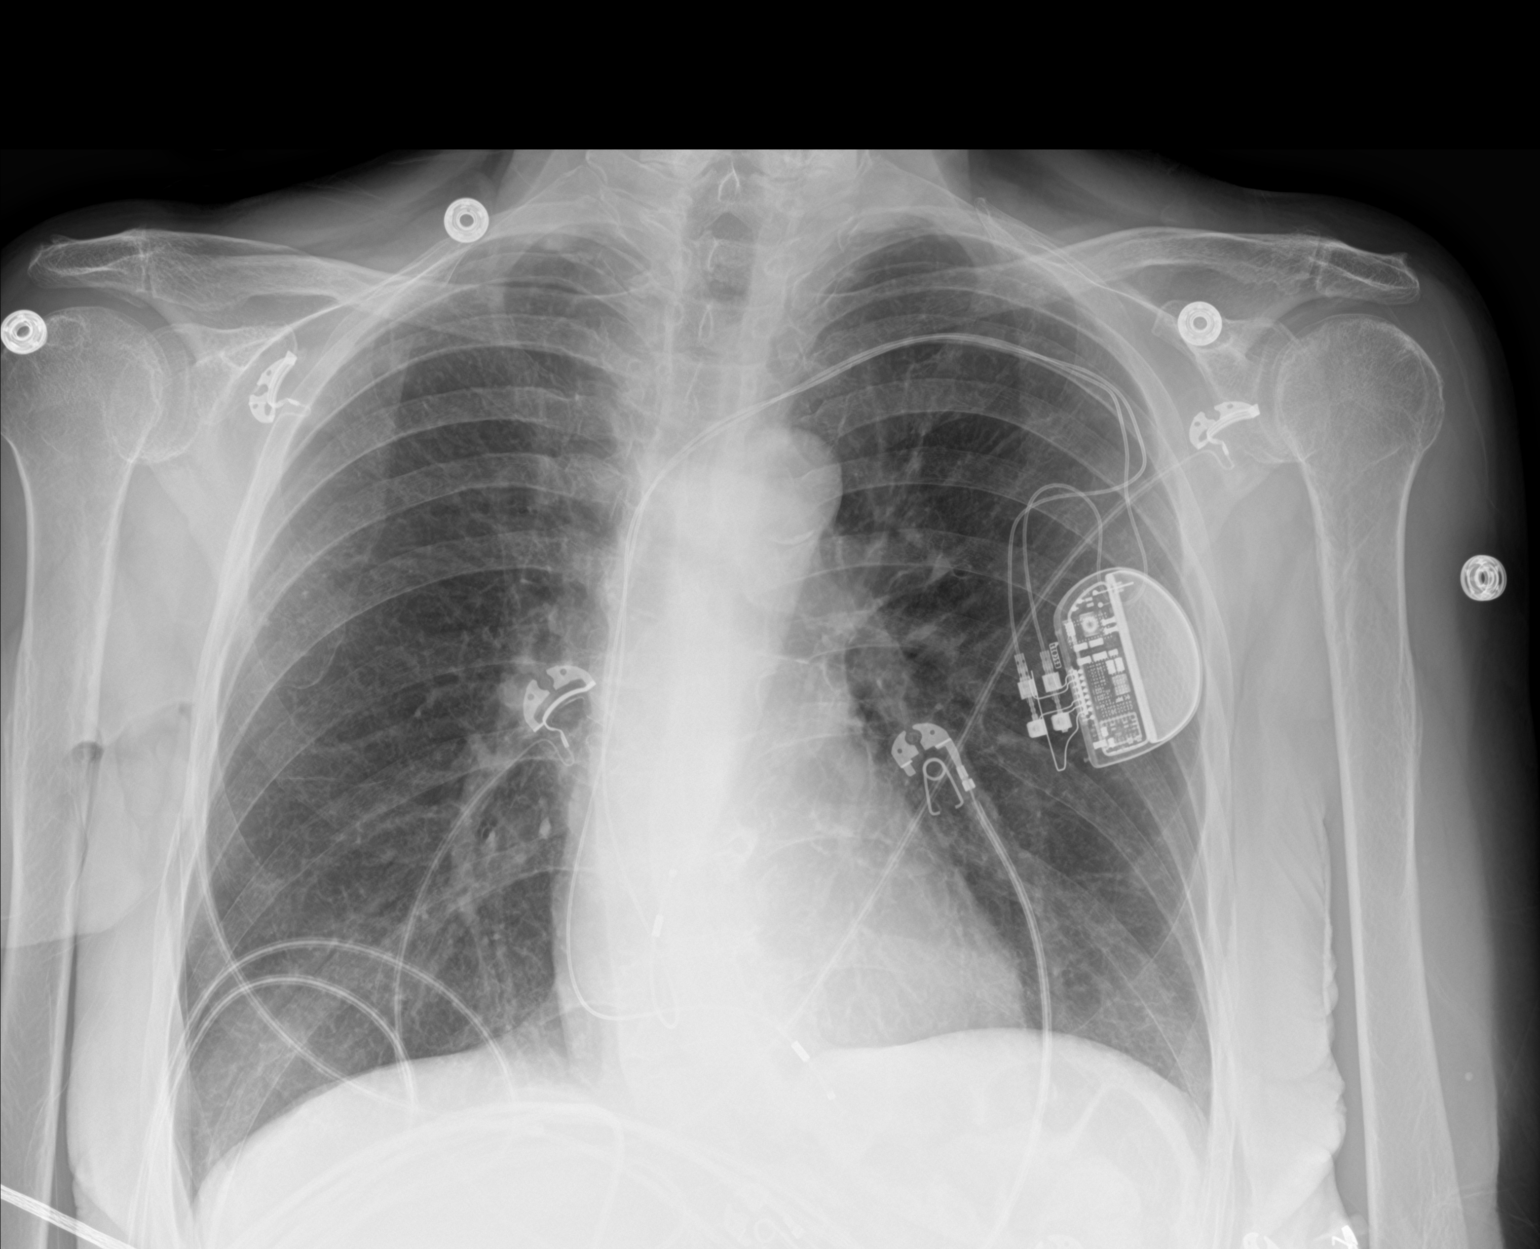

[2 of 2 positions shown; findings below may reference images not displayed]

FINDINGS: LEFT subclavian transvenous pacemaker leads project at RIGHT atrium
and RIGHT ventricle.

Normal heart size, mediastinal contours, and pulmonary vascularity.

Atherosclerotic calcification aorta.

Lungs minimally hyperinflated but clear.

No infiltrate, pleural effusion or pneumothorax.

Bones demineralized with evidence of prior cervical spine fusion.
IMPRESSION: No acute abnormalities.

Aortic atherosclerosis.

## 2019-04-16 DIAGNOSIS — R200 Anesthesia of skin: Secondary | ICD-10-CM | POA: Diagnosis not present

## 2019-04-16 DIAGNOSIS — Z7901 Long term (current) use of anticoagulants: Secondary | ICD-10-CM | POA: Diagnosis not present

## 2019-04-17 DIAGNOSIS — I4891 Unspecified atrial fibrillation: Secondary | ICD-10-CM | POA: Diagnosis not present

## 2019-04-17 DIAGNOSIS — I4892 Unspecified atrial flutter: Secondary | ICD-10-CM | POA: Diagnosis not present

## 2019-05-01 DIAGNOSIS — K219 Gastro-esophageal reflux disease without esophagitis: Secondary | ICD-10-CM | POA: Diagnosis not present

## 2019-05-01 DIAGNOSIS — I10 Essential (primary) hypertension: Secondary | ICD-10-CM | POA: Diagnosis not present

## 2019-05-06 DIAGNOSIS — E1169 Type 2 diabetes mellitus with other specified complication: Secondary | ICD-10-CM | POA: Diagnosis not present

## 2019-05-06 DIAGNOSIS — E1142 Type 2 diabetes mellitus with diabetic polyneuropathy: Secondary | ICD-10-CM | POA: Diagnosis not present

## 2019-05-06 DIAGNOSIS — M205X1 Other deformities of toe(s) (acquired), right foot: Secondary | ICD-10-CM | POA: Diagnosis not present

## 2019-05-06 DIAGNOSIS — I10 Essential (primary) hypertension: Secondary | ICD-10-CM | POA: Diagnosis not present

## 2019-05-06 DIAGNOSIS — B351 Tinea unguium: Secondary | ICD-10-CM | POA: Diagnosis not present

## 2019-05-14 DIAGNOSIS — G4733 Obstructive sleep apnea (adult) (pediatric): Secondary | ICD-10-CM | POA: Diagnosis not present

## 2019-05-14 DIAGNOSIS — R0683 Snoring: Secondary | ICD-10-CM | POA: Diagnosis not present

## 2019-05-14 DIAGNOSIS — I10 Essential (primary) hypertension: Secondary | ICD-10-CM | POA: Diagnosis not present

## 2019-05-14 DIAGNOSIS — G47419 Narcolepsy without cataplexy: Secondary | ICD-10-CM | POA: Diagnosis not present

## 2019-05-20 DIAGNOSIS — G47419 Narcolepsy without cataplexy: Secondary | ICD-10-CM | POA: Diagnosis not present

## 2019-05-20 DIAGNOSIS — E119 Type 2 diabetes mellitus without complications: Secondary | ICD-10-CM | POA: Diagnosis not present

## 2019-05-20 DIAGNOSIS — I480 Paroxysmal atrial fibrillation: Secondary | ICD-10-CM | POA: Diagnosis not present

## 2019-05-20 DIAGNOSIS — I10 Essential (primary) hypertension: Secondary | ICD-10-CM | POA: Diagnosis not present

## 2019-05-20 DIAGNOSIS — Z950 Presence of cardiac pacemaker: Secondary | ICD-10-CM | POA: Diagnosis not present

## 2019-05-21 DIAGNOSIS — G47419 Narcolepsy without cataplexy: Secondary | ICD-10-CM | POA: Diagnosis not present

## 2019-05-29 DIAGNOSIS — H35363 Drusen (degenerative) of macula, bilateral: Secondary | ICD-10-CM | POA: Diagnosis not present

## 2019-05-29 DIAGNOSIS — H524 Presbyopia: Secondary | ICD-10-CM | POA: Diagnosis not present

## 2019-06-09 DIAGNOSIS — H43811 Vitreous degeneration, right eye: Secondary | ICD-10-CM | POA: Diagnosis not present

## 2019-06-09 DIAGNOSIS — H26493 Other secondary cataract, bilateral: Secondary | ICD-10-CM | POA: Diagnosis not present

## 2019-06-09 DIAGNOSIS — H527 Unspecified disorder of refraction: Secondary | ICD-10-CM | POA: Diagnosis not present

## 2019-06-09 DIAGNOSIS — Z961 Presence of intraocular lens: Secondary | ICD-10-CM | POA: Diagnosis not present

## 2019-06-19 DIAGNOSIS — H26492 Other secondary cataract, left eye: Secondary | ICD-10-CM | POA: Diagnosis not present

## 2019-07-09 DIAGNOSIS — R5382 Chronic fatigue, unspecified: Secondary | ICD-10-CM | POA: Diagnosis not present

## 2019-07-09 DIAGNOSIS — I10 Essential (primary) hypertension: Secondary | ICD-10-CM | POA: Diagnosis not present

## 2019-07-11 DIAGNOSIS — I313 Pericardial effusion (noninflammatory): Secondary | ICD-10-CM | POA: Diagnosis not present

## 2019-07-11 DIAGNOSIS — I10 Essential (primary) hypertension: Secondary | ICD-10-CM | POA: Diagnosis not present

## 2019-07-11 DIAGNOSIS — R531 Weakness: Secondary | ICD-10-CM | POA: Diagnosis not present

## 2019-07-11 DIAGNOSIS — R0602 Shortness of breath: Secondary | ICD-10-CM | POA: Diagnosis not present

## 2019-07-11 DIAGNOSIS — I314 Cardiac tamponade: Secondary | ICD-10-CM | POA: Diagnosis not present

## 2019-07-11 DIAGNOSIS — R5382 Chronic fatigue, unspecified: Secondary | ICD-10-CM | POA: Diagnosis not present

## 2019-07-11 DIAGNOSIS — D519 Vitamin B12 deficiency anemia, unspecified: Secondary | ICD-10-CM | POA: Diagnosis not present

## 2019-07-21 DIAGNOSIS — I495 Sick sinus syndrome: Secondary | ICD-10-CM | POA: Diagnosis not present

## 2019-07-21 DIAGNOSIS — Z45010 Encounter for checking and testing of cardiac pacemaker pulse generator [battery]: Secondary | ICD-10-CM | POA: Diagnosis not present

## 2019-07-21 DIAGNOSIS — Z950 Presence of cardiac pacemaker: Secondary | ICD-10-CM | POA: Diagnosis not present

## 2019-07-31 DIAGNOSIS — H35363 Drusen (degenerative) of macula, bilateral: Secondary | ICD-10-CM | POA: Diagnosis not present

## 2019-07-31 DIAGNOSIS — H524 Presbyopia: Secondary | ICD-10-CM | POA: Diagnosis not present

## 2019-08-07 DIAGNOSIS — B351 Tinea unguium: Secondary | ICD-10-CM | POA: Diagnosis not present

## 2019-08-07 DIAGNOSIS — I10 Essential (primary) hypertension: Secondary | ICD-10-CM | POA: Diagnosis not present

## 2019-08-07 DIAGNOSIS — E1169 Type 2 diabetes mellitus with other specified complication: Secondary | ICD-10-CM | POA: Diagnosis not present

## 2019-08-07 DIAGNOSIS — M722 Plantar fascial fibromatosis: Secondary | ICD-10-CM | POA: Diagnosis not present

## 2019-08-07 DIAGNOSIS — E1142 Type 2 diabetes mellitus with diabetic polyneuropathy: Secondary | ICD-10-CM | POA: Diagnosis not present

## 2019-08-19 DIAGNOSIS — R7303 Prediabetes: Secondary | ICD-10-CM | POA: Diagnosis not present

## 2019-08-19 DIAGNOSIS — Z0000 Encounter for general adult medical examination without abnormal findings: Secondary | ICD-10-CM | POA: Diagnosis not present

## 2019-08-19 DIAGNOSIS — G2581 Restless legs syndrome: Secondary | ICD-10-CM | POA: Diagnosis not present

## 2019-08-19 DIAGNOSIS — I10 Essential (primary) hypertension: Secondary | ICD-10-CM | POA: Diagnosis not present

## 2019-08-19 DIAGNOSIS — Z23 Encounter for immunization: Secondary | ICD-10-CM | POA: Diagnosis not present

## 2019-09-18 IMAGING — DX DG CHEST 2V
2 series · 2 of 2 positions shown · non-contrast
Comparison: CT chest 08/03/2017.  PA and lateral chest 04/17/2017

CLINICAL DATA: Chest pain, shortness of breath and dizziness for 2
days.

EXAM:
CHEST  2 VIEW

[w chest pa]
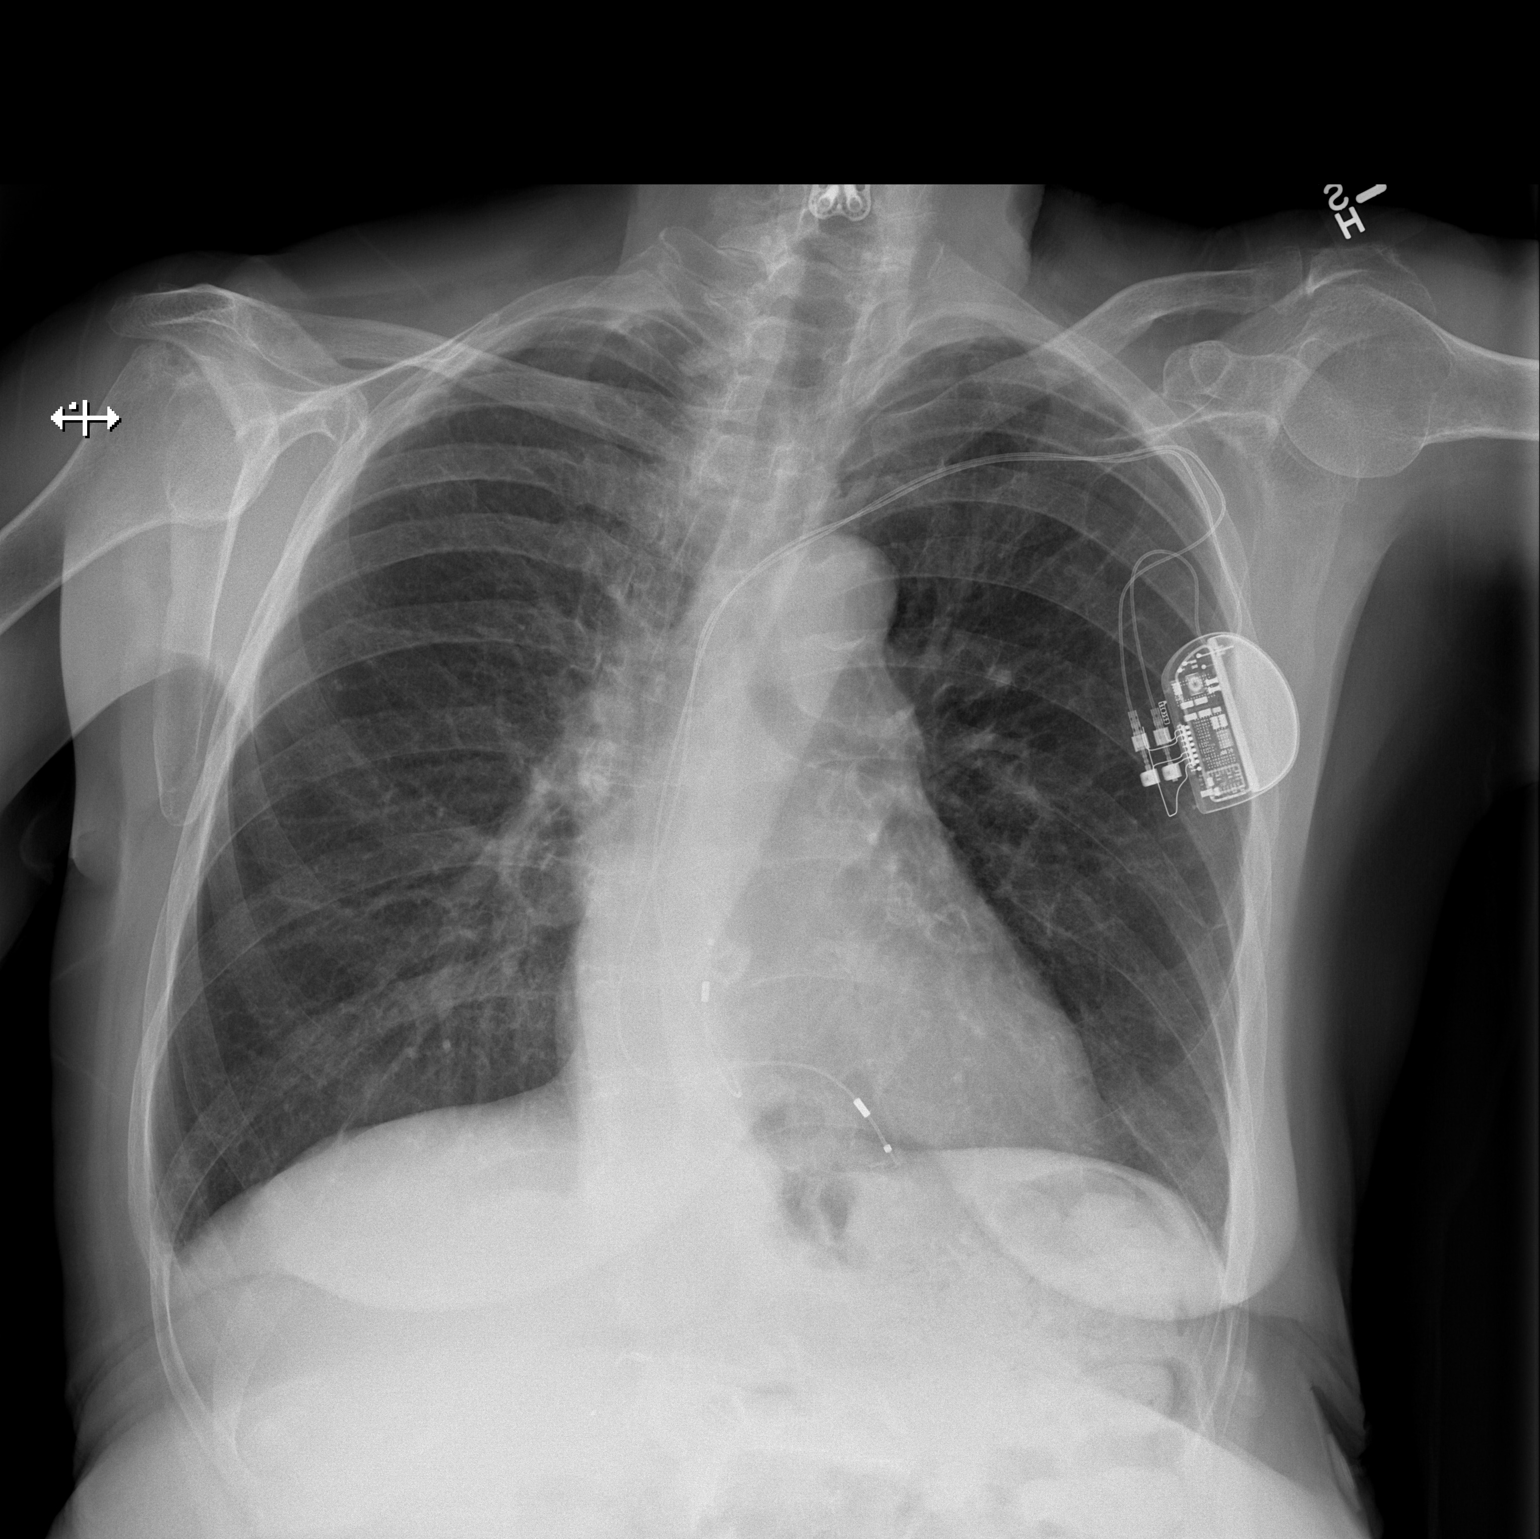

[w chest lat]
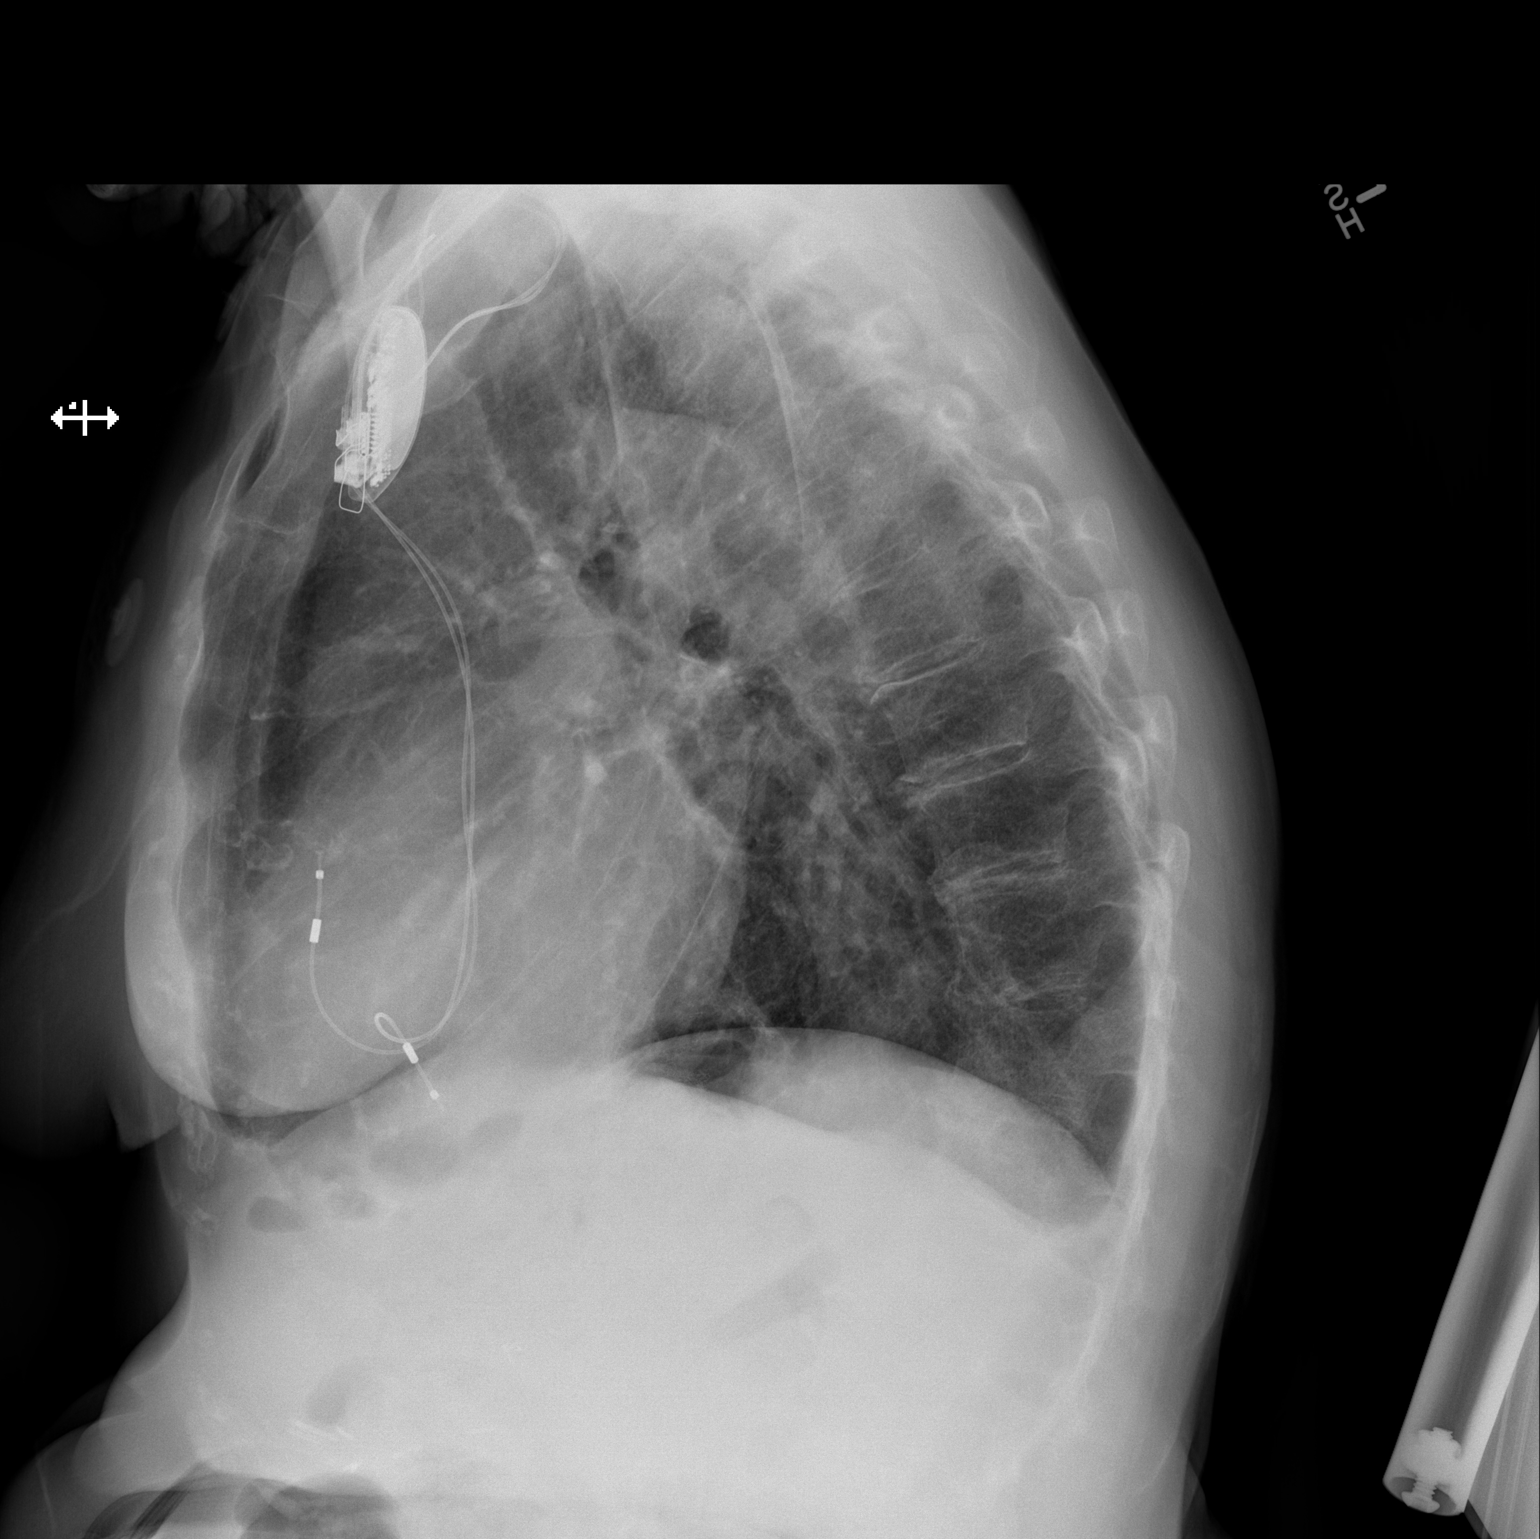

[2 of 2 positions shown; findings below may reference images not displayed]

FINDINGS: Lungs are clear. Heart size is normal. No pneumothorax or pleural
effusion. Aortic atherosclerosis is seen. Pacing device is in place.
No acute bony abnormality.
IMPRESSION: No acute disease.

Atherosclerosis.

## 2019-09-22 DIAGNOSIS — R29898 Other symptoms and signs involving the musculoskeletal system: Secondary | ICD-10-CM | POA: Diagnosis not present

## 2019-09-22 DIAGNOSIS — R5383 Other fatigue: Secondary | ICD-10-CM | POA: Diagnosis not present

## 2019-09-22 DIAGNOSIS — I10 Essential (primary) hypertension: Secondary | ICD-10-CM | POA: Diagnosis not present

## 2019-09-22 DIAGNOSIS — I495 Sick sinus syndrome: Secondary | ICD-10-CM | POA: Diagnosis not present

## 2019-09-22 DIAGNOSIS — I480 Paroxysmal atrial fibrillation: Secondary | ICD-10-CM | POA: Diagnosis not present

## 2019-09-22 DIAGNOSIS — E7849 Other hyperlipidemia: Secondary | ICD-10-CM | POA: Diagnosis not present

## 2019-10-03 DIAGNOSIS — R29898 Other symptoms and signs involving the musculoskeletal system: Secondary | ICD-10-CM | POA: Diagnosis not present

## 2019-10-22 DIAGNOSIS — I495 Sick sinus syndrome: Secondary | ICD-10-CM | POA: Diagnosis not present

## 2019-11-03 DIAGNOSIS — I10 Essential (primary) hypertension: Secondary | ICD-10-CM | POA: Diagnosis not present

## 2019-11-03 DIAGNOSIS — M21621 Bunionette of right foot: Secondary | ICD-10-CM | POA: Diagnosis not present

## 2019-11-03 DIAGNOSIS — E1169 Type 2 diabetes mellitus with other specified complication: Secondary | ICD-10-CM | POA: Diagnosis not present

## 2019-11-03 DIAGNOSIS — L859 Epidermal thickening, unspecified: Secondary | ICD-10-CM | POA: Diagnosis not present

## 2019-11-03 DIAGNOSIS — E1142 Type 2 diabetes mellitus with diabetic polyneuropathy: Secondary | ICD-10-CM | POA: Diagnosis not present

## 2019-11-04 DIAGNOSIS — R7303 Prediabetes: Secondary | ICD-10-CM | POA: Diagnosis not present

## 2019-11-04 DIAGNOSIS — R5382 Chronic fatigue, unspecified: Secondary | ICD-10-CM | POA: Diagnosis not present

## 2019-11-04 DIAGNOSIS — E538 Deficiency of other specified B group vitamins: Secondary | ICD-10-CM | POA: Diagnosis not present

## 2019-11-04 DIAGNOSIS — I480 Paroxysmal atrial fibrillation: Secondary | ICD-10-CM | POA: Diagnosis not present

## 2019-11-04 DIAGNOSIS — I10 Essential (primary) hypertension: Secondary | ICD-10-CM | POA: Diagnosis not present

## 2019-11-04 DIAGNOSIS — E782 Mixed hyperlipidemia: Secondary | ICD-10-CM | POA: Diagnosis not present

## 2019-11-10 DIAGNOSIS — R319 Hematuria, unspecified: Secondary | ICD-10-CM | POA: Diagnosis not present

## 2019-11-10 DIAGNOSIS — R700 Elevated erythrocyte sedimentation rate: Secondary | ICD-10-CM | POA: Diagnosis not present

## 2019-11-10 DIAGNOSIS — I313 Pericardial effusion (noninflammatory): Secondary | ICD-10-CM | POA: Diagnosis not present

## 2019-11-10 DIAGNOSIS — I10 Essential (primary) hypertension: Secondary | ICD-10-CM | POA: Diagnosis not present

## 2019-11-10 DIAGNOSIS — R5383 Other fatigue: Secondary | ICD-10-CM | POA: Diagnosis not present
